# Patient Record
Sex: Male | Born: 1975 | Race: White | Hispanic: No | Marital: Single | Smoking: Current some day smoker
Health system: Southern US, Community
[De-identification: ages and names within clinical notes are randomized; demographics above are authoritative.]

## PROBLEM LIST (undated history)

## (undated) DIAGNOSIS — M542 Cervicalgia: Secondary | ICD-10-CM

## (undated) DIAGNOSIS — R569 Unspecified convulsions: Secondary | ICD-10-CM

## (undated) DIAGNOSIS — M1990 Unspecified osteoarthritis, unspecified site: Secondary | ICD-10-CM

## (undated) DIAGNOSIS — M549 Dorsalgia, unspecified: Secondary | ICD-10-CM

## (undated) DIAGNOSIS — J45909 Unspecified asthma, uncomplicated: Secondary | ICD-10-CM

## (undated) DIAGNOSIS — M25512 Pain in left shoulder: Secondary | ICD-10-CM

## (undated) DIAGNOSIS — F419 Anxiety disorder, unspecified: Secondary | ICD-10-CM

## (undated) DIAGNOSIS — K219 Gastro-esophageal reflux disease without esophagitis: Secondary | ICD-10-CM

## (undated) DIAGNOSIS — F329 Major depressive disorder, single episode, unspecified: Secondary | ICD-10-CM

## (undated) DIAGNOSIS — R202 Paresthesia of skin: Secondary | ICD-10-CM

## (undated) DIAGNOSIS — G8929 Other chronic pain: Secondary | ICD-10-CM

## (undated) DIAGNOSIS — Z8619 Personal history of other infectious and parasitic diseases: Secondary | ICD-10-CM

## (undated) DIAGNOSIS — G43909 Migraine, unspecified, not intractable, without status migrainosus: Secondary | ICD-10-CM

## (undated) DIAGNOSIS — F32A Depression, unspecified: Secondary | ICD-10-CM

## (undated) HISTORY — DX: Unspecified asthma, uncomplicated: J45.909

## (undated) HISTORY — PX: KNEE SURGERY: SHX244

## (undated) HISTORY — DX: Unspecified osteoarthritis, unspecified site: M19.90

## (undated) HISTORY — PX: BACK SURGERY: SHX140

## (undated) HISTORY — DX: Gastro-esophageal reflux disease without esophagitis: K21.9

## (undated) HISTORY — DX: Anxiety disorder, unspecified: F41.9

---
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
## 1998-02-22 ENCOUNTER — Encounter: Admission: RE | Admit: 1998-02-22 | Discharge: 1998-02-22 | Payer: Self-pay | Admitting: Family Medicine

## 1998-03-01 ENCOUNTER — Encounter: Admission: RE | Admit: 1998-03-01 | Discharge: 1998-03-01 | Payer: Self-pay | Admitting: Family Medicine

## 1998-05-04 ENCOUNTER — Emergency Department (HOSPITAL_COMMUNITY): Admission: EM | Admit: 1998-05-04 | Discharge: 1998-05-04 | Payer: Self-pay | Admitting: Emergency Medicine

## 1999-01-20 ENCOUNTER — Encounter: Admission: RE | Admit: 1999-01-20 | Discharge: 1999-01-20 | Payer: Self-pay | Admitting: Family Medicine

## 2000-06-11 ENCOUNTER — Emergency Department (HOSPITAL_COMMUNITY): Admission: EM | Admit: 2000-06-11 | Discharge: 2000-06-11 | Payer: Self-pay | Admitting: Emergency Medicine

## 2000-06-11 ENCOUNTER — Encounter: Admission: RE | Admit: 2000-06-11 | Discharge: 2000-06-11 | Payer: Self-pay | Admitting: Family Medicine

## 2000-12-18 ENCOUNTER — Emergency Department (HOSPITAL_COMMUNITY): Admission: EM | Admit: 2000-12-18 | Discharge: 2000-12-18 | Payer: Self-pay | Admitting: Unknown Physician Specialty

## 2001-02-28 ENCOUNTER — Encounter: Payer: Self-pay | Admitting: Emergency Medicine

## 2001-02-28 ENCOUNTER — Emergency Department (HOSPITAL_COMMUNITY): Admission: EM | Admit: 2001-02-28 | Discharge: 2001-02-28 | Payer: Self-pay | Admitting: Emergency Medicine

## 2001-03-05 ENCOUNTER — Encounter: Admission: RE | Admit: 2001-03-05 | Discharge: 2001-03-05 | Payer: Self-pay | Admitting: Sports Medicine

## 2001-03-20 ENCOUNTER — Encounter: Admission: RE | Admit: 2001-03-20 | Discharge: 2001-03-20 | Payer: Self-pay | Admitting: Family Medicine

## 2001-03-22 ENCOUNTER — Encounter: Admission: RE | Admit: 2001-03-22 | Discharge: 2001-03-22 | Payer: Self-pay | Admitting: *Deleted

## 2001-03-22 ENCOUNTER — Encounter: Admission: RE | Admit: 2001-03-22 | Discharge: 2001-03-22 | Payer: Self-pay | Admitting: Family Medicine

## 2001-08-18 ENCOUNTER — Emergency Department (HOSPITAL_COMMUNITY): Admission: EM | Admit: 2001-08-18 | Discharge: 2001-08-18 | Payer: Self-pay | Admitting: Emergency Medicine

## 2001-09-24 ENCOUNTER — Encounter: Admission: RE | Admit: 2001-09-24 | Discharge: 2001-09-24 | Payer: Self-pay | Admitting: Family Medicine

## 2001-10-11 ENCOUNTER — Encounter: Admission: RE | Admit: 2001-10-11 | Discharge: 2001-10-11 | Payer: Self-pay | Admitting: Family Medicine

## 2002-01-14 ENCOUNTER — Encounter: Admission: RE | Admit: 2002-01-14 | Discharge: 2002-01-14 | Payer: Self-pay | Admitting: Family Medicine

## 2002-06-30 ENCOUNTER — Encounter: Admission: RE | Admit: 2002-06-30 | Discharge: 2002-06-30 | Payer: Self-pay | Admitting: Family Medicine

## 2003-04-21 ENCOUNTER — Emergency Department (HOSPITAL_COMMUNITY): Admission: EM | Admit: 2003-04-21 | Discharge: 2003-04-22 | Payer: Self-pay | Admitting: Emergency Medicine

## 2003-06-17 ENCOUNTER — Emergency Department (HOSPITAL_COMMUNITY): Admission: AD | Admit: 2003-06-17 | Discharge: 2003-06-17 | Payer: Self-pay | Admitting: Family Medicine

## 2003-06-18 ENCOUNTER — Emergency Department (HOSPITAL_COMMUNITY): Admission: AD | Admit: 2003-06-18 | Discharge: 2003-06-18 | Payer: Self-pay | Admitting: Family Medicine

## 2003-06-18 ENCOUNTER — Ambulatory Visit (HOSPITAL_COMMUNITY): Admission: RE | Admit: 2003-06-18 | Discharge: 2003-06-18 | Payer: Self-pay | Admitting: Family Medicine

## 2003-06-18 ENCOUNTER — Encounter: Payer: Self-pay | Admitting: Family Medicine

## 2003-07-13 ENCOUNTER — Encounter: Admission: RE | Admit: 2003-07-13 | Discharge: 2003-07-13 | Payer: Self-pay | Admitting: Sports Medicine

## 2003-07-14 ENCOUNTER — Encounter: Admission: RE | Admit: 2003-07-14 | Discharge: 2003-07-14 | Payer: Self-pay | Admitting: Sports Medicine

## 2004-03-24 ENCOUNTER — Emergency Department (HOSPITAL_COMMUNITY): Admission: EM | Admit: 2004-03-24 | Discharge: 2004-03-24 | Payer: Self-pay | Admitting: *Deleted

## 2004-04-06 ENCOUNTER — Emergency Department (HOSPITAL_COMMUNITY): Admission: EM | Admit: 2004-04-06 | Discharge: 2004-04-06 | Payer: Self-pay | Admitting: Emergency Medicine

## 2004-04-25 ENCOUNTER — Encounter: Admission: RE | Admit: 2004-04-25 | Discharge: 2004-04-25 | Payer: Self-pay | Admitting: Family Medicine

## 2004-08-29 ENCOUNTER — Emergency Department (HOSPITAL_COMMUNITY): Admission: EM | Admit: 2004-08-29 | Discharge: 2004-08-29 | Payer: Self-pay | Admitting: Emergency Medicine

## 2004-08-30 ENCOUNTER — Ambulatory Visit: Payer: Self-pay | Admitting: Family Medicine

## 2004-10-16 ENCOUNTER — Emergency Department (HOSPITAL_COMMUNITY): Admission: EM | Admit: 2004-10-16 | Discharge: 2004-10-16 | Payer: Self-pay | Admitting: Emergency Medicine

## 2005-03-13 ENCOUNTER — Emergency Department: Payer: Self-pay | Admitting: Emergency Medicine

## 2005-04-17 ENCOUNTER — Emergency Department (HOSPITAL_COMMUNITY): Admission: EM | Admit: 2005-04-17 | Discharge: 2005-04-17 | Payer: Self-pay | Admitting: Emergency Medicine

## 2005-05-02 ENCOUNTER — Emergency Department (HOSPITAL_COMMUNITY): Admission: EM | Admit: 2005-05-02 | Discharge: 2005-05-02 | Payer: Self-pay | Admitting: *Deleted

## 2006-01-18 ENCOUNTER — Emergency Department (HOSPITAL_COMMUNITY): Admission: EM | Admit: 2006-01-18 | Discharge: 2006-01-18 | Payer: Self-pay | Admitting: Emergency Medicine

## 2006-01-23 ENCOUNTER — Ambulatory Visit: Payer: Self-pay | Admitting: Sports Medicine

## 2006-02-05 ENCOUNTER — Ambulatory Visit: Payer: Self-pay | Admitting: Family Medicine

## 2006-02-08 ENCOUNTER — Encounter: Admission: RE | Admit: 2006-02-08 | Discharge: 2006-05-09 | Payer: Self-pay | Admitting: Sports Medicine

## 2006-02-23 ENCOUNTER — Ambulatory Visit: Payer: Self-pay | Admitting: Family Medicine

## 2006-03-01 ENCOUNTER — Ambulatory Visit: Payer: Self-pay | Admitting: Family Medicine

## 2006-03-01 ENCOUNTER — Encounter: Admission: RE | Admit: 2006-03-01 | Discharge: 2006-03-01 | Payer: Self-pay | Admitting: Sports Medicine

## 2006-03-19 ENCOUNTER — Ambulatory Visit: Payer: Self-pay | Admitting: Family Medicine

## 2006-03-21 ENCOUNTER — Ambulatory Visit: Payer: Self-pay | Admitting: Sports Medicine

## 2006-03-24 ENCOUNTER — Encounter: Admission: RE | Admit: 2006-03-24 | Discharge: 2006-03-24 | Payer: Self-pay | Admitting: Sports Medicine

## 2006-03-28 ENCOUNTER — Ambulatory Visit: Payer: Self-pay | Admitting: Sports Medicine

## 2006-06-11 ENCOUNTER — Emergency Department (HOSPITAL_COMMUNITY): Admission: EM | Admit: 2006-06-11 | Discharge: 2006-06-11 | Payer: Self-pay | Admitting: Emergency Medicine

## 2006-07-25 ENCOUNTER — Ambulatory Visit (HOSPITAL_COMMUNITY): Admission: RE | Admit: 2006-07-25 | Discharge: 2006-07-26 | Payer: Self-pay | Admitting: Specialist

## 2007-07-04 ENCOUNTER — Telehealth: Payer: Self-pay | Admitting: *Deleted

## 2007-07-04 ENCOUNTER — Ambulatory Visit: Payer: Self-pay | Admitting: Family Medicine

## 2007-07-04 DIAGNOSIS — F4323 Adjustment disorder with mixed anxiety and depressed mood: Secondary | ICD-10-CM

## 2007-07-05 ENCOUNTER — Telehealth (INDEPENDENT_AMBULATORY_CARE_PROVIDER_SITE_OTHER): Payer: Self-pay | Admitting: *Deleted

## 2007-07-23 ENCOUNTER — Telehealth (INDEPENDENT_AMBULATORY_CARE_PROVIDER_SITE_OTHER): Payer: Self-pay | Admitting: *Deleted

## 2007-07-26 ENCOUNTER — Ambulatory Visit: Payer: Self-pay | Admitting: Family Medicine

## 2007-07-26 ENCOUNTER — Encounter (INDEPENDENT_AMBULATORY_CARE_PROVIDER_SITE_OTHER): Payer: Self-pay | Admitting: Family Medicine

## 2007-08-26 ENCOUNTER — Emergency Department (HOSPITAL_COMMUNITY): Admission: EM | Admit: 2007-08-26 | Discharge: 2007-08-26 | Payer: Self-pay | Admitting: Emergency Medicine

## 2007-09-19 ENCOUNTER — Ambulatory Visit: Payer: Self-pay | Admitting: Family Medicine

## 2007-09-20 ENCOUNTER — Telehealth: Payer: Self-pay | Admitting: Family Medicine

## 2007-10-15 ENCOUNTER — Emergency Department (HOSPITAL_COMMUNITY): Admission: EM | Admit: 2007-10-15 | Discharge: 2007-10-15 | Payer: Self-pay | Admitting: Emergency Medicine

## 2007-10-25 ENCOUNTER — Emergency Department (HOSPITAL_COMMUNITY): Admission: EM | Admit: 2007-10-25 | Discharge: 2007-10-26 | Payer: Self-pay | Admitting: Emergency Medicine

## 2007-10-28 ENCOUNTER — Encounter: Payer: Self-pay | Admitting: *Deleted

## 2007-10-28 ENCOUNTER — Ambulatory Visit: Payer: Self-pay | Admitting: Sports Medicine

## 2007-10-28 ENCOUNTER — Telehealth: Payer: Self-pay | Admitting: *Deleted

## 2007-10-31 ENCOUNTER — Telehealth: Payer: Self-pay | Admitting: *Deleted

## 2007-10-31 ENCOUNTER — Emergency Department (HOSPITAL_COMMUNITY): Admission: EM | Admit: 2007-10-31 | Discharge: 2007-10-31 | Payer: Self-pay | Admitting: Family Medicine

## 2008-01-21 ENCOUNTER — Emergency Department (HOSPITAL_COMMUNITY): Admission: EM | Admit: 2008-01-21 | Discharge: 2008-01-21 | Payer: Self-pay | Admitting: Emergency Medicine

## 2008-04-07 ENCOUNTER — Ambulatory Visit: Payer: Self-pay | Admitting: Family Medicine

## 2008-04-07 ENCOUNTER — Encounter: Payer: Self-pay | Admitting: *Deleted

## 2008-06-10 ENCOUNTER — Ambulatory Visit: Payer: Self-pay | Admitting: Family Medicine

## 2008-06-10 ENCOUNTER — Telehealth: Payer: Self-pay | Admitting: *Deleted

## 2008-07-08 ENCOUNTER — Emergency Department (HOSPITAL_COMMUNITY): Admission: EM | Admit: 2008-07-08 | Discharge: 2008-07-08 | Payer: Self-pay | Admitting: Emergency Medicine

## 2008-07-10 ENCOUNTER — Encounter: Payer: Self-pay | Admitting: Family Medicine

## 2008-07-10 ENCOUNTER — Ambulatory Visit: Payer: Self-pay | Admitting: Family Medicine

## 2008-07-10 ENCOUNTER — Telehealth: Payer: Self-pay | Admitting: *Deleted

## 2008-07-10 DIAGNOSIS — M545 Low back pain: Secondary | ICD-10-CM

## 2008-07-15 ENCOUNTER — Ambulatory Visit: Payer: Self-pay | Admitting: Family Medicine

## 2008-07-15 ENCOUNTER — Telehealth: Payer: Self-pay | Admitting: *Deleted

## 2008-07-23 ENCOUNTER — Telehealth: Payer: Self-pay | Admitting: *Deleted

## 2008-07-24 ENCOUNTER — Ambulatory Visit: Payer: Self-pay | Admitting: Family Medicine

## 2008-07-27 ENCOUNTER — Ambulatory Visit: Payer: Self-pay | Admitting: Family Medicine

## 2008-08-05 ENCOUNTER — Emergency Department (HOSPITAL_COMMUNITY): Admission: EM | Admit: 2008-08-05 | Discharge: 2008-08-06 | Payer: Self-pay | Admitting: Emergency Medicine

## 2008-08-05 ENCOUNTER — Telehealth: Payer: Self-pay | Admitting: Family Medicine

## 2008-08-06 ENCOUNTER — Ambulatory Visit: Payer: Self-pay | Admitting: Family Medicine

## 2008-08-06 ENCOUNTER — Ambulatory Visit (HOSPITAL_COMMUNITY): Admission: RE | Admit: 2008-08-06 | Discharge: 2008-08-06 | Payer: Self-pay | Admitting: Family Medicine

## 2008-08-10 ENCOUNTER — Ambulatory Visit: Payer: Self-pay | Admitting: Family Medicine

## 2009-01-27 ENCOUNTER — Encounter: Payer: Self-pay | Admitting: Family Medicine

## 2009-02-15 ENCOUNTER — Ambulatory Visit: Payer: Self-pay | Admitting: Family Medicine

## 2009-02-15 DIAGNOSIS — G43909 Migraine, unspecified, not intractable, without status migrainosus: Secondary | ICD-10-CM

## 2009-02-25 ENCOUNTER — Emergency Department (HOSPITAL_COMMUNITY): Admission: EM | Admit: 2009-02-25 | Discharge: 2009-02-25 | Payer: Self-pay | Admitting: Family Medicine

## 2009-02-26 ENCOUNTER — Emergency Department (HOSPITAL_COMMUNITY): Admission: EM | Admit: 2009-02-26 | Discharge: 2009-02-26 | Payer: Self-pay | Admitting: Emergency Medicine

## 2009-03-10 ENCOUNTER — Ambulatory Visit: Payer: Self-pay | Admitting: Family Medicine

## 2009-03-10 ENCOUNTER — Telehealth: Payer: Self-pay | Admitting: Family Medicine

## 2009-05-10 ENCOUNTER — Encounter (INDEPENDENT_AMBULATORY_CARE_PROVIDER_SITE_OTHER): Payer: Self-pay | Admitting: *Deleted

## 2009-05-10 DIAGNOSIS — F17200 Nicotine dependence, unspecified, uncomplicated: Secondary | ICD-10-CM | POA: Insufficient documentation

## 2009-05-10 HISTORY — DX: Nicotine dependence, unspecified, uncomplicated: F17.200

## 2009-08-22 ENCOUNTER — Emergency Department (HOSPITAL_COMMUNITY): Admission: EM | Admit: 2009-08-22 | Discharge: 2009-08-22 | Payer: Self-pay | Admitting: Family Medicine

## 2010-03-13 ENCOUNTER — Emergency Department (HOSPITAL_COMMUNITY): Admission: EM | Admit: 2010-03-13 | Discharge: 2010-03-13 | Payer: Self-pay | Admitting: Emergency Medicine

## 2010-03-14 ENCOUNTER — Encounter: Payer: Self-pay | Admitting: Family Medicine

## 2010-03-14 ENCOUNTER — Ambulatory Visit: Payer: Self-pay | Admitting: Family Medicine

## 2010-03-18 ENCOUNTER — Telehealth: Payer: Self-pay | Admitting: *Deleted

## 2010-03-18 ENCOUNTER — Ambulatory Visit: Payer: Self-pay | Admitting: Family Medicine

## 2010-03-22 ENCOUNTER — Encounter: Payer: Self-pay | Admitting: *Deleted

## 2010-04-29 ENCOUNTER — Emergency Department (HOSPITAL_COMMUNITY): Admission: EM | Admit: 2010-04-29 | Discharge: 2010-04-29 | Payer: Self-pay | Admitting: Emergency Medicine

## 2010-07-11 ENCOUNTER — Emergency Department (HOSPITAL_COMMUNITY): Admission: EM | Admit: 2010-07-11 | Discharge: 2010-07-11 | Payer: Self-pay | Admitting: Emergency Medicine

## 2010-10-04 NOTE — Progress Notes (Signed)
Summary: Triage  Phone Note Call from Patient Call back at Home Phone [phone]   Reason for Call: Talk to Nurse Summary of Call: pt put on neurotin, last night foot starting swelling  & pt  read that was a serious side effect Initial call taken by: Knox Royalty,  March 18, 2010 2:44 PM  Follow-up for Phone Call        he read that this is a serious side effect. to come in now to see a doctor. started the med 03/14/10 Follow-up by: Golden Circle RN,  March 18, 2010 2:48 PM

## 2010-10-04 NOTE — Miscellaneous (Signed)
Summary: ROI  ROI   Imported By: Knox Royalty 03/25/2010 12:10:41  _____________________________________________________________________  External Attachment:    Type:   Image     Comment:   External Document

## 2010-10-04 NOTE — Assessment & Plan Note (Signed)
Summary: f/u back pain,df   Vital Signs:  Patient profile:   35 year old male Weight:      224.5 pounds Temp:     98.1 degrees F oral Pulse rate:   69 / minute Pulse rhythm:   regular BP sitting:   138 / 80  (left arm) Cuff size:   large  Vitals Entered By: Loralee Pacas CMA (March 14, 2010 9:56 AM) CC: back pain Pain Assessment Patient in pain? yes     Location: lower back Intensity: 9   Primary Care Provider:  . [facility]  CC:  back pain.  History of Present Illness: 35 yo here for ER follow-up  Hx of chronic back pain with history of L5 "disk shaving" per patient.  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  Was prescribed vicodin.  Described as low back pain, radiating down left leg down to heel.  No weakness, numbness, tingling, urinarty or bowel retention or incontinence.  Patient says his goals are increasing function and pain management until he gets insurance back at which time he plans on continuing care with Dr. Shelle Iron at [facility] and getting lumbar fusion.  He declines physical therapy stating he has already done so and got no benefit.  Says narcotics help, declines NSAID use as he has a relative with GI upset and would like to avoid this side effect.  He states he has not been here since Dr. Dayton Martes but he has a twin brother who also comes here.  He has several recent visits here in the last year with Dr. Karie Schwalbe and Dr. Burnadette Pop.    Habits & Providers  Alcohol-Tobacco-Diet     Tobacco Status: current  Current Medications (verified): 1)  Imitrex 25 Mg Tabs (Sumatriptan Succinate) .... One Tablet By Mouth At The Start of The Migraine and Then Another Tablet 2 Hours Later If The Ha Continues 2)  Flexeril 10 Mg Tabs (Cyclobenzaprine Hcl) .... One Tab By Mouth Three Times A Day 3)  Gabapentin 300 Mg Caps (Gabapentin) .... Take One Tablet Three Times A Day  Allergies: No Known Drug  Allergies PMH-FH-SH reviewed-no changes except otherwise noted  Review of Systems      See HPI General:  Denies fever and weight loss. MS:  Complains of low back pain. Neuro:  Denies numbness, tingling, and weakness.  Physical Exam  General:  A&O, here with son.  NAD. Msk:  pain in lower lumbar area with contralateral straight leg raise with positive radiation on straight leg testing.  No pain on twisting motion.  Pain on palpation of left lower lumbar area.  NO pain over spinous processes. Neurologic:  strength and sesation normal in LE.   Impression & Recommendations:  Problem # 1:  LOW BACK PAIN (ICD-724.2)  Chronic pain.  Discussed his history and ultimate plan for surgery once he gets insurance.  We discussed overall approach being physical therapy, improving function while reducing but not eliminating pain.  I told him I would not prescribe narcotics for him and decided to try neurontin for chronic pain/ sciatica pain.  Offered tylenol + NSAID but he declined.  Will follow-up in 3-4 weeks to increase gabapentin if needed/ evaluate pain management.   Will request records from [facility] today.  His updated medication list for this problem includes:    Flexeril 10 Mg Tabs (Cyclobenzaprine hcl) ..... One tab by mouth three times  a day  Orders: [facility]- Est Level  3 (62130)  Problem # 2:  DEPRESSIVE DISORDER (ICD-311) briefly discussed with patient link between chronic pain and depression.  He states he has been feeling well until several days ago increased back pain has "gotten him down"  He is reluctant to discuss his history and is not open to treatment at this time as he does not feel it is an issue.  Complete Medication List: 1)  Imitrex 25 Mg Tabs (Sumatriptan succinate) .... One tablet by mouth at the start of the migraine and then another tablet 2 hours later if the ha continues 2)  Flexeril 10 Mg Tabs (Cyclobenzaprine hcl) .... One tab by mouth three times a day 3)   Gabapentin 300 Mg Caps (Gabapentin) .... Take one tablet three times a day  Patient Instructions: 1)  Nice to meet you 2)  Make follow-up appt in 3-4 weeks  3)  Given handout on low back pain exercises Prescriptions: GABAPENTIN 300 MG CAPS (GABAPENTIN) take one tablet three times a day  #90 x 1   Entered and Authorized by:   Delbert Harness MD   Signed by:   Delbert Harness MD on 03/14/2010   Method used:   Electronically to        [facility]* (retail)       [address] [place], [place]  [address]       Ph: [phone] or [phone]       Fax: [phone]   RxID:   [id number]

## 2010-10-04 NOTE — Assessment & Plan Note (Signed)
Summary: foot is swollen/[place]/[facility]   Vital Signs:  Patient profile:   35 year old male Weight:      230.5 pounds Temp:     98. degrees F oral Pulse rate:   111 / minute Pulse rhythm:   regular BP sitting:   121 / 73  (left arm) Cuff size:   large  Vitals Entered By: Loralee Pacas CMA (March 18, 2010 3:57 PM) CC: right leg swollen, back pain Is Patient Diabetic? No Comments right leg swollen x 1 day pt states that he has started having panic attacks   Primary Care Provider:  . [facility]  CC:  right leg swollen and back pain.  History of Present Illness: New leg swelling Rt leg > Left.  Did not happen until started gabapentin.  No trauma or other DVT risk.  Reviewed gabapentin information: peripheral edema occurs  ~8% of patients.  Also list angioedema as possibility - he has had fixed swelling of the legs.  As I was finishing the history portion of the visit.  He asked "What are you going to do now about my pain?"  I informed him that my plan was to start another neuropathic pain med.  He volunteered that he had found that if he took 2 vicodin, his pain was relieved and thus wanted a prescription.  I informed him that was not my plan.    Patient then became upset and said that he had to come here because he lost his insurance, that we did not practice good medicine because we did not treat his pain and that he would go elsewhere if he had options because he had no confidence in [place].  He also said he would ask to speak to the "head guy" to voice these complaints.  I told him two things: First, I am the head guy. Second, that we did not want to care for someone who thinks we are bad docs and only came to [place] because he had no other options. This responce made him more angry and he left before an exam was done.  He was visibly upset and angry and proceeded to voice that displeasure at the nursing station saying, "That doctor is an asshole."  Habits &  Providers  Alcohol-Tobacco-Diet     Tobacco Status: current  Allergies: No Known Drug Allergies  Physical Exam  General:  Well-developed,well-nourished,in no acute distress; alert.  Short term wt gain since last visit noted. Extremities:  Both legs visably swollen Rt>Lt.  Patient left before further exam could be done.   Impression & Recommendations:  Problem # 1:  LOW BACK PAIN (ICD-724.2)  Chronic.  While I had intended to begin nortryptaline, no Rx was written His updated medication list for this problem includes:    Flexeril 10 Mg Tabs (Cyclobenzaprine hcl) ..... One tab by mouth three times a day  Orders: [facility]- Est Level  3 (60454)  Problem # 2:  ADVERSE DRUG REACTION (ICD-995.20)  gabapentin likely cause of swelling.  Advised to stop gabapentin.  Orders: [facility]- Est Level  3 (09811)  Complete Medication List: 1)  Imitrex 25 Mg Tabs (Sumatriptan succinate) .... One tablet by mouth at the start of the migraine and then another tablet 2 hours later if the ha continues 2)  Flexeril 10 Mg Tabs (Cyclobenzaprine hcl) .... One tab by mouth three times a day

## 2010-10-04 NOTE — Assessment & Plan Note (Signed)
 Summary: back is locked up /[place]   Vital Signs:  Patient profile:   35 year old male Height:      71 inches Weight:      224 pounds BMI:     31.35 Temp:     98.4 degrees F Pulse rate:   85 / minute BP sitting:   136 / 94  (right arm)  Vitals Entered By: Greig Lunger RN (March 10, 2009 1:41 PM) CC: back pain Pain Assessment Patient in pain? yes     Location: back Intensity: 9 Type: sharp   Primary Care Provider:  Alda Carpen  MD  CC:  back pain.  History of Present Illness: 63M with Hx chronic LBP comes in with c/o having had something pop.  Was bending over and heard a pop, followed by pain.  States that it had been getting better actually, but the quality was similar to his pain at previous visits.  No numbness/tingling down legs.  No bowel/bladder dysfunction or saddle anesthesia.  Is able to walk, pain is a 5/10.  He became very angry when I told him that I would not be prescribing narcotics.  He also refused trigger point injections.  He had been using flexeril  but ran out.  Says this helps him a lot.  Has also been using ibuprofen .  No PT yet as he does not have enough money.  States that he does not trust XR and MRI because they didnt show any problems, then he went to the orthopod who took him to the OR and shaved off bone spurs that were pinching a nerve.  Habits & Providers  Alcohol-Tobacco-Diet     Tobacco Status: current     Cigarette Packs/Day: 0.25  Allergies: No Known Drug Allergies  Past History:  Past Medical History: Last updated: 08/06/2008 hx of 5 concussions back injury 2007  Social History: Packs/Day:  0.25  Review of Systems       See HPI  Physical Exam  General:  Well-developed,well-nourished,in no acute distress; alert,appropriate and cooperative throughout examination Lungs:  Normal respiratory effort, chest expands symmetrically. Lungs are clear to auscultation, no crackles or wheezes. Heart:  Normal rate and regular rhythm. S1  and S2 normal without gallop, murmur, click, rub or other extra sounds. Msk:  No overt swelling or erythema visible to me,  however he does have some left sided muscle spasm approx at the intercristal line.  He refused to have me inject the trigger point. Extremities:  No clubbing, cyanosis, edema, or deformity noted with normal full range of motion of all joints.   Neurologic:  Plantar reflexes are down-going bilaterally. DTRs are symmetrical throughout. Sensory, motor and coordinative functions appear intact.    Impression & Recommendations:  Problem # 1:  LOW BACK PAIN (ICD-724.2) Assessment Deteriorated Chronic LBP, likely similar as before.  No acute trauma.  Recommended continuing Flexeril  as this helped in the past, Also heating pads, stretching, massage, and PT as soon as he can afford it.  He is in the process of getting insurance through his work but says this could take a  year.  As he does have a trigger point I think he could benefit (at least in the short term) from injection of the area if it is still present at subsequent visits.  No narcotics as they have no long term benefit with this condition.  No red flags or signs of cauda equina syndrome or cord compression.  His updated medication list for this  problem includes:    Flexeril  10 Mg Tabs (Cyclobenzaprine  hcl) ..... One tab by mouth three times a day  Orders: [facility]- Est Level  3 (00786)  Complete Medication List: 1)  Imitrex  25 Mg Tabs (Sumatriptan  succinate) .... One tablet by mouth at the start of the migraine and then another tablet 2 hours later if the ha continues 2)  Flexeril  10 Mg Tabs (Cyclobenzaprine  hcl) .... One tab by mouth three times a day  Patient Instructions: 1)  Good to meet you today, 2)  I want you to use heating pads, stretch, stay active, and use your flexeril  and Ibuprofen  as prescribed.  Also try to have someone massage the spasming muscle in your lower back.  Do not take more than 4000mg  of  ibuprofen  in one day. 3)  I have refilled your flexeril . 4)  Come back to see Dr. Alla at your next regularly scheduled appt. 5)  -Dr. ONEIDA. Prescriptions: FLEXERIL  10 MG TABS (CYCLOBENZAPRINE  HCL) One tab by mouth three times a day  #90 x 1   Entered and Authorized by:   Debby Petties MD   Signed by:   Debby Petties MD on 03/10/2009   Method used:   Electronically to        [facility]* (retail)       [address] [place], [place]  [address]       Ph: [phone] or [phone]       Fax: [phone]   RxID:   [id number]

## 2010-10-04 NOTE — Letter (Signed)
Summary: Termination of Care Letter  [facility]  [address]   [place], [place] [address]   Phone: [phone]  Fax: [phone]    03/22/2010 MRN: [id number]  Dear Micheal Roth,  Based on the behavior you displayed at your 03/18/10 office visit with Dr. Leveda Anna, please be advised that the [facility] will no longer be your healthcare provider.  Accordingly, all further requests for appointments and office visits will be declined.  As such, you are not allowed on the premises of the [facility].  Should you have another physician who will take over your care, please advise [place] and we will forward a copy of your medical records.  If you do not know of another physician, we will assist you with a referral.  The termination of your relationship with this office is effective immediately.  In the next 30 days if you find that you have a need for medical care we will refer you to another provider for that care.   Sincerely,  [facility]

## 2010-10-21 ENCOUNTER — Encounter: Payer: Self-pay | Admitting: *Deleted

## 2010-12-05 LAB — DIFFERENTIAL
Basophils Relative: 0 % (ref 0–1)
Eosinophils Absolute: 0 10*3/uL (ref 0.0–0.7)
Eosinophils Relative: 0 % (ref 0–5)
Lymphocytes Relative: 3 % — ABNORMAL LOW (ref 12–46)
Monocytes Absolute: 0.6 10*3/uL (ref 0.1–1.0)
Monocytes Relative: 5 % (ref 3–12)
Neutro Abs: 10.2 10*3/uL — ABNORMAL HIGH (ref 1.7–7.7)

## 2010-12-05 LAB — POCT I-STAT, CHEM 8
Creatinine, Ser: 1.1 mg/dL (ref 0.4–1.5)
Glucose, Bld: 106 mg/dL — ABNORMAL HIGH (ref 70–99)
HCT: 50 % (ref 39.0–52.0)
Hemoglobin: 17 g/dL (ref 13.0–17.0)
Potassium: 3.8 mEq/L (ref 3.5–5.1)
Sodium: 137 mEq/L (ref 135–145)
TCO2: 25 mmol/L (ref 0–100)

## 2010-12-05 LAB — CBC
HCT: 49.2 % (ref 39.0–52.0)
Hemoglobin: 17.1 g/dL — ABNORMAL HIGH (ref 13.0–17.0)
MCHC: 34.7 g/dL (ref 30.0–36.0)
MCV: 91.3 fL (ref 78.0–100.0)
RBC: 5.39 MIL/uL (ref 4.22–5.81)
WBC: 11.1 10*3/uL — ABNORMAL HIGH (ref 4.0–10.5)

## 2010-12-05 LAB — POCT URINALYSIS DIP (DEVICE): pH: 6 (ref 5.0–8.0)

## 2010-12-05 LAB — POCT RAPID STREP A (OFFICE): Streptococcus, Group A Screen (Direct): NEGATIVE

## 2010-12-18 ENCOUNTER — Emergency Department (HOSPITAL_COMMUNITY)
Admission: EM | Admit: 2010-12-18 | Discharge: 2010-12-18 | Disposition: A | Discharge status: Home / Self Care | Payer: Self-pay | Attending: Emergency Medicine | Admitting: Emergency Medicine

## 2010-12-18 ENCOUNTER — Emergency Department (HOSPITAL_COMMUNITY): Payer: Self-pay

## 2010-12-18 DIAGNOSIS — Y92009 Unspecified place in unspecified non-institutional (private) residence as the place of occurrence of the external cause: Secondary | ICD-10-CM | POA: Insufficient documentation

## 2010-12-18 DIAGNOSIS — F3289 Other specified depressive episodes: Secondary | ICD-10-CM | POA: Insufficient documentation

## 2010-12-18 DIAGNOSIS — M545 Low back pain, unspecified: Secondary | ICD-10-CM | POA: Insufficient documentation

## 2010-12-18 DIAGNOSIS — G8929 Other chronic pain: Secondary | ICD-10-CM | POA: Insufficient documentation

## 2010-12-18 DIAGNOSIS — Z9889 Other specified postprocedural states: Secondary | ICD-10-CM | POA: Insufficient documentation

## 2010-12-18 DIAGNOSIS — IMO0002 Reserved for concepts with insufficient information to code with codable children: Secondary | ICD-10-CM | POA: Insufficient documentation

## 2010-12-18 DIAGNOSIS — F17200 Nicotine dependence, unspecified, uncomplicated: Secondary | ICD-10-CM | POA: Insufficient documentation

## 2010-12-18 DIAGNOSIS — M25519 Pain in unspecified shoulder: Secondary | ICD-10-CM | POA: Insufficient documentation

## 2010-12-18 DIAGNOSIS — F329 Major depressive disorder, single episode, unspecified: Secondary | ICD-10-CM | POA: Insufficient documentation

## 2011-01-12 ENCOUNTER — Telehealth: Payer: Self-pay | Admitting: Family Medicine

## 2011-01-12 ENCOUNTER — Emergency Department (HOSPITAL_COMMUNITY)
Admission: EM | Admit: 2011-01-12 | Discharge: 2011-01-12 | Disposition: A | Discharge status: Home / Self Care | Payer: Self-pay | Attending: Emergency Medicine | Admitting: Emergency Medicine

## 2011-01-12 ENCOUNTER — Other Ambulatory Visit (HOSPITAL_COMMUNITY): Payer: Self-pay

## 2011-01-12 ENCOUNTER — Emergency Department (HOSPITAL_COMMUNITY): Payer: Self-pay

## 2011-01-12 DIAGNOSIS — H9319 Tinnitus, unspecified ear: Secondary | ICD-10-CM | POA: Insufficient documentation

## 2011-01-12 DIAGNOSIS — R42 Dizziness and giddiness: Secondary | ICD-10-CM | POA: Insufficient documentation

## 2011-01-12 DIAGNOSIS — Z9889 Other specified postprocedural states: Secondary | ICD-10-CM | POA: Insufficient documentation

## 2011-01-12 DIAGNOSIS — R4789 Other speech disturbances: Secondary | ICD-10-CM | POA: Insufficient documentation

## 2011-01-12 DIAGNOSIS — R471 Dysarthria and anarthria: Secondary | ICD-10-CM | POA: Insufficient documentation

## 2011-01-12 DIAGNOSIS — F17200 Nicotine dependence, unspecified, uncomplicated: Secondary | ICD-10-CM | POA: Insufficient documentation

## 2011-01-12 NOTE — Telephone Encounter (Signed)
Patient was dismissed due to disrespectful behavior displayed to Dr. Leveda Anna.  Patient was upset that Dr. Leveda Anna would not prescribe pain medication.  Began yelling, using foul language and walked out of the clinic.  Patient has a h/o of this type of behavior so based on this he will not be allowed back in the practice.

## 2011-01-12 NOTE — Telephone Encounter (Signed)
Pt was dismissed July 2011 & is asking to come back, said he was told to call & speak with office manager.

## 2011-01-12 NOTE — Telephone Encounter (Signed)
Noted and agree. 

## 2011-01-20 NOTE — Op Note (Signed)
Roth, Micheal           ACCOUNT NO.:  [id number]   MEDICAL RECORD NO.:  [id number]          PATIENT TYPE:  OIB   LOCATION:  0098                         FACILITY:  [facility]   PHYSICIAN:  Jene Every, M.D.    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   POSTOPERATIVE DIAGNOSES:  Spinal stenosis with herniated nucleus  pulposus at L5-S1, left.   PROCEDURE PERFORMED:  Lateral recess decompression at L5-S1,  microdiskectomy L5-S1, foraminotomy of S1.   ANESTHESIA:  General.   SURGEON:  Jene Every, M.D.   ASSISTANT:  Roma Schanz, P.A.   INDICATIONS FOR PROCEDURE:  This is a 35 year old with S1 radiculopathy  and refractory to conservative treatment.  Operative intervention is  indicated for decompression of the S1 nerve root and a decompression of  the lateral recess by a disk herniation and facet hypertrophy and  ligament of flavum hypertrophy.  The risks and benefits were discussed  including bleeding, infection, damage to neurovascular structures, CSF  leakage, epidural fibrosis, adjacent segment disease, need for fusion in  the future and anesthetic complications, etc.   DESCRIPTION OF PROCEDURE:  The patient was placed in the supine  position.  After the induction of adequate general anesthesia and 1 gram  of Kefzol, the patient was placed prone on the [place] frame.  All bony  prominences were well-padded.  The lumbar region was prepped and draped  in the usual sterile fashion.  Two #18 gauge spinal needles were  utilized to localize the interspace at 5-1.  An incision was made from  the spinous process of 5-1.  The subcutaneous tissues were dissected.  Electrocautery was utilized to obtain hemostasis.  The dorsal lumbar  fascia identified and divided in line with the skin  incision.  The  paraspinous muscle elevated from the lamina of L5 and S1.  Two Penfield-  4's were placed in the inner-lamina spaces identifying 4-5 and then 5-1.  We took the most caudad space and palpated the sacrum, confirming the L5-  S1 interspace.  This was bi-axillary.  We then placed a McCullough  retractor.  The operating microscope was draped and brought into the  surgical field.  Next, the ligament of flavum detached from the cephalad  edge of S1 utilizing a straight curet.  A foraminotomy of S1 was  performed.  A hemi-laminotomy of the caudad edge of L5 was performed  with a 2 mm Kerrison.  Noted were remnants of a cortisone injection.  Also significant lateral recess stenosis, secondary to facet  hypertrophy, ligament of flavum hypertrophy, a focal disk herniation and  epidural venous plexus.  We decompressed the lateral recess.  We gently  mobilized the S1 nerve root medially.  Identified a focal herniated  nucleus pulposus.  An annulotomy was performed.  Copious portion of disk  material was removed from the disk space with straight and up-biting  pituitary.  Multiple passes.  A full diskectomy of herniated material  was then performed.  Bipolar electrocautery was utilized to achieve  hemostasis.  We had at least 1 cm of excursion of the S1 nerve root  medial to the pedicle without difficulty.  The disk space was copiously  irrigated with antibiotic irrigation.  Inspection revealed no CSF  leakage or active bleeding.  We checked in the axilla of the nerve root,  the shoulder, beneath the thecal sac and the foramen of L5 and S1.  No  residual disk herniation.  We then placed 1 mL of fentanyl in the  epidural space and removed the McCullough retractor and inspection of  the paraspinous muscles and had no evidence of active bleeding.  We  irrigated then and closed the fascia with #1 Vicryl interrupted figure-  of-eight sutures.  The subcutaneous tissues were reapproximated with  #2-  0 Vicryl sutures and the skin was reapproximated with staples.  The  wound was dressed sterilely.  The patient was placed supine on the  hospital bed and extubated without difficulty.   The patient was transported to the recovery room in satisfactory  condition.   COMPLICATIONS:  None.      Jene Every, M.D.  Electronically Signed     JB/MEDQ  D:  07/25/2006  T:  07/25/2006  Job:  [id number]

## 2011-01-30 ENCOUNTER — Emergency Department (HOSPITAL_COMMUNITY)
Admission: EM | Admit: 2011-01-30 | Discharge: 2011-01-30 | Disposition: A | Discharge status: Home / Self Care | Payer: Self-pay | Attending: Emergency Medicine | Admitting: Emergency Medicine

## 2011-01-30 DIAGNOSIS — M545 Low back pain, unspecified: Secondary | ICD-10-CM | POA: Insufficient documentation

## 2011-01-30 DIAGNOSIS — R209 Unspecified disturbances of skin sensation: Secondary | ICD-10-CM | POA: Insufficient documentation

## 2011-01-30 DIAGNOSIS — F329 Major depressive disorder, single episode, unspecified: Secondary | ICD-10-CM | POA: Insufficient documentation

## 2011-01-30 DIAGNOSIS — G8929 Other chronic pain: Secondary | ICD-10-CM | POA: Insufficient documentation

## 2011-01-30 DIAGNOSIS — F3289 Other specified depressive episodes: Secondary | ICD-10-CM | POA: Insufficient documentation

## 2011-01-30 DIAGNOSIS — F17200 Nicotine dependence, unspecified, uncomplicated: Secondary | ICD-10-CM | POA: Insufficient documentation

## 2011-01-30 LAB — URINALYSIS, ROUTINE W REFLEX MICROSCOPIC
Bilirubin Urine: NEGATIVE
Protein, ur: NEGATIVE mg/dL
Specific Gravity, Urine: 1.013 (ref 1.005–1.030)

## 2011-04-27 ENCOUNTER — Emergency Department (HOSPITAL_COMMUNITY)
Admission: EM | Admit: 2011-04-27 | Discharge: 2011-04-27 | Disposition: A | Discharge status: Home / Self Care | Payer: Self-pay | Attending: Emergency Medicine | Admitting: Emergency Medicine

## 2011-04-27 ENCOUNTER — Emergency Department (HOSPITAL_COMMUNITY): Payer: Self-pay

## 2011-04-27 DIAGNOSIS — M25569 Pain in unspecified knee: Secondary | ICD-10-CM | POA: Insufficient documentation

## 2011-04-27 DIAGNOSIS — F329 Major depressive disorder, single episode, unspecified: Secondary | ICD-10-CM | POA: Insufficient documentation

## 2011-04-27 DIAGNOSIS — F3289 Other specified depressive episodes: Secondary | ICD-10-CM | POA: Insufficient documentation

## 2011-04-27 DIAGNOSIS — F411 Generalized anxiety disorder: Secondary | ICD-10-CM | POA: Insufficient documentation

## 2011-06-06 LAB — CULTURE, ROUTINE-ABSCESS

## 2011-09-20 ENCOUNTER — Emergency Department (HOSPITAL_COMMUNITY)
Admission: EM | Admit: 2011-09-20 | Discharge: 2011-09-20 | Disposition: A | Discharge status: Home / Self Care | Payer: Self-pay | Attending: Emergency Medicine | Admitting: Emergency Medicine

## 2011-09-20 ENCOUNTER — Encounter (HOSPITAL_COMMUNITY): Payer: Self-pay

## 2011-09-20 DIAGNOSIS — H538 Other visual disturbances: Secondary | ICD-10-CM | POA: Insufficient documentation

## 2011-09-20 DIAGNOSIS — H53149 Visual discomfort, unspecified: Secondary | ICD-10-CM | POA: Insufficient documentation

## 2011-09-20 DIAGNOSIS — G43909 Migraine, unspecified, not intractable, without status migrainosus: Secondary | ICD-10-CM | POA: Insufficient documentation

## 2011-09-20 DIAGNOSIS — R110 Nausea: Secondary | ICD-10-CM | POA: Insufficient documentation

## 2011-09-20 HISTORY — DX: Migraine, unspecified, not intractable, without status migrainosus: G43.909

## 2011-09-20 MED ORDER — DEXAMETHASONE SODIUM PHOSPHATE 10 MG/ML IJ SOLN
10.0000 mg | Freq: Once | INTRAMUSCULAR | Status: AC
  Administered 2011-09-20: 10 mg via INTRAVENOUS
  Filled 2011-09-20 (×2): qty 1

## 2011-09-20 MED ORDER — KETOROLAC TROMETHAMINE 30 MG/ML IJ SOLN
30.0000 mg | Freq: Once | INTRAMUSCULAR | Status: AC
  Administered 2011-09-20: 30 mg via INTRAVENOUS
  Filled 2011-09-20 (×2): qty 1

## 2011-09-20 MED ORDER — SODIUM CHLORIDE 0.9 % IV BOLUS (SEPSIS)
1000.0000 mL | Freq: Once | INTRAVENOUS | Status: AC
  Administered 2011-09-20: 1000 mL via INTRAVENOUS

## 2011-09-20 MED ORDER — METOCLOPRAMIDE HCL 5 MG/ML IJ SOLN
10.0000 mg | Freq: Once | INTRAMUSCULAR | Status: AC
  Administered 2011-09-20: 10 mg via INTRAVENOUS
  Filled 2011-09-20: qty 2

## 2011-09-20 MED ORDER — DIPHENHYDRAMINE HCL 50 MG/ML IJ SOLN
25.0000 mg | Freq: Once | INTRAMUSCULAR | Status: AC
  Administered 2011-09-20: 25 mg via INTRAVENOUS
  Filled 2011-09-20 (×2): qty 1

## 2011-09-20 MED ORDER — HYDROMORPHONE HCL PF 1 MG/ML IJ SOLN
1.0000 mg | Freq: Once | INTRAMUSCULAR | Status: AC
Start: 2011-09-20 — End: 2011-09-20
  Administered 2011-09-20: 1 mg via INTRAVENOUS
  Filled 2011-09-20: qty 1

## 2011-09-20 MED ORDER — HYDROMORPHONE HCL PF 1 MG/ML IJ SOLN
1.0000 mg | Freq: Once | INTRAMUSCULAR | Status: AC
  Administered 2011-09-20: 1 mg via INTRAVENOUS
  Filled 2011-09-20: qty 1

## 2011-09-20 MED ORDER — PROMETHAZINE HCL 25 MG/ML IJ SOLN
25.0000 mg | INTRAMUSCULAR | Status: AC
  Administered 2011-09-20: 25 mg via INTRAVENOUS
  Filled 2011-09-20: qty 1

## 2011-09-20 NOTE — ED Notes (Signed)
Pt sleeping at present, lights are dimmed in room for pt comfort.

## 2011-09-20 NOTE — ED Notes (Signed)
Migraine began on Friday constant, with nausea. Denies any vomiting, pt. Woke up this am with blurred vision.

## 2011-09-20 NOTE — ED Provider Notes (Signed)
History     CSN: [id number]  Arrival date & time 09/20/11  0820   First MD Initiated Contact with Patient 09/20/11 0848      Chief Complaint  Patient presents with  . Migraine    (Consider location/radiation/quality/duration/timing/severity/associated sxs/prior treatment) HPI Comments: The patient reports he has had a migraine headache x 5 days.  The pain is on the left side of his head and behind his left eye and feels like his previous bad migraines.  The last time he had a migraine that was this bad was approximately 1 year ago.  Associated nausea and blurry vision, which is also typical of his migraines.  Pain is exacerbated by light and sound.  Has been taking tylenol and benadryl at home without improvement.  Denies fevers, head injury, neck stiffness, "worst" headache of his life, focal neurological deficits.   Patient is a 36 y.o. male presenting with migraine. The history is provided by the patient.  Migraine Associated symptoms include headaches. Pertinent negatives include no abdominal pain, chest pain, coughing, fever, neck pain, numbness, sore throat or weakness.    Past Medical History  Diagnosis Date  . Migraines     History reviewed. No pertinent past surgical history.  History reviewed. No pertinent family history.  History  Substance Use Topics  . Smoking status: Current Everyday Smoker    Types: Cigarettes  . Smokeless tobacco: Never Used  . Alcohol Use: Yes      Review of Systems  Constitutional: Negative for fever.  HENT: Negative for sore throat, neck pain and neck stiffness.   Eyes: Positive for photophobia and visual disturbance.       Blurry vision.  No diplopia.  Respiratory: Negative for cough and shortness of breath.   Cardiovascular: Negative for chest pain.  Gastrointestinal: Negative for abdominal pain.  Neurological: Positive for headaches. Negative for dizziness, weakness, light-headedness and numbness.  All other systems reviewed and  are negative.    Allergies  Review of patient's allergies indicates no known allergies.  Home Medications   Current Outpatient Rx  Name Route Sig Dispense Refill  . ACETAMINOPHEN 500 MG PO TABS Oral Take 1,000-2,000 mg by mouth every 6 (six) hours as needed. For migraine    . DIPHENHYDRAMINE HCL (SLEEP) 25 MG PO TABS Oral Take 50-100 mg by mouth daily as needed. For sleep to assit with migraine      BP 151/94  Pulse 91  Temp(Src) 98.2 F (36.8 C) (Oral)  Resp 16  Ht 6' (1.829 m)  Wt 230 lb (104.327 kg)  BMI 31.19 kg/m2  SpO2 98%  Physical Exam  Constitutional: He is oriented to person, place, and time. He appears well-developed and well-nourished.  HENT:  Head: Normocephalic and atraumatic.  Neck: Neck supple.  Cardiovascular: Normal rate, regular rhythm and normal heart sounds.   Pulmonary/Chest: Breath sounds normal. No respiratory distress. He has no wheezes. He has no rales. He exhibits no tenderness.  Abdominal: Soft. Bowel sounds are normal. There is no tenderness.  Neurological: He is alert and oriented to person, place, and time. He has normal strength. No cranial nerve deficit or sensory deficit. GCS eye subscore is 4. GCS verbal subscore is 5. GCS motor subscore is 6.       Finger to nose, rapid alternating movements, heel to shin normal.  EOMs intact.  Grip strengths equal bilaterally.  Strength 5/5 throughout.  No pronator drift.      ED Course  Procedures (including critical care time)  Labs Reviewed - No data to display No results found.  12:26 PM Patient states pain continues to be 10/10.  Confirms that this headache is exactly like his previous migraines - same course and quality, associated features.  States Demerol used to be the only medication that helped his headache when it was this bad.  Discussed narcotic use for migraines and the possibility of a rebound headache, patient states he would prefer to have the pain relief even with the possibility of  rebound.    1:18 PM Patient states headache is starting to "ease up" after 1mg  Dilaudid.  States pain is now 8-9/10, but is appearing very comfortable.   1:33 PM Patient clarifies that the 8/10 is based on 10/10 being his worst headache.    2:13 PM Patient continues to improve, now 6/10, states blurry vision has subsided.  On reexamination, A&Ox4, NAD, CN II-XII intact, EOMs intact, no pronator drift, grip strengths strong and equal bilaterally, strength 5/5 in all extremities, sensation intact; finger to nose, rapid alternating movements, and heel to shin normal; gait is normal.    3:10 PM Patient reports he is feeling much better and would like to be d/c home.    1. Migraine       MDM  Patient with migraine headache that is typical of his migraine headaches.  Neurologically intact.  No fever, head trauma, "worst" headache, neck stiffness, focal neurological deficits.  Pain relief achieved in ED.  [facility] and other local resources.          Rise Patience, [place] 09/20/11 1528

## 2011-09-20 NOTE — ED Notes (Addendum)
Pt c/o migraine headache, nausea, blurred vision and sensitivity to light x5 days. Pt reports using Benadryl and Tylenol at home w/no relief, hx of migraines.

## 2011-09-21 NOTE — ED Provider Notes (Signed)
Medical screening examination/treatment/procedure(s) were performed by non-physician practitioner and as supervising physician I was immediately available for consultation/collaboration.   Shaunette Gassner M Haidee Stogsdill, DO 09/21/11 0716 

## 2011-10-02 ENCOUNTER — Encounter (HOSPITAL_COMMUNITY): Payer: Self-pay | Admitting: Emergency Medicine

## 2011-10-02 ENCOUNTER — Emergency Department (HOSPITAL_COMMUNITY)
Admission: EM | Admit: 2011-10-02 | Discharge: 2011-10-02 | Disposition: A | Discharge status: Home / Self Care | Payer: Self-pay | Attending: Emergency Medicine | Admitting: Emergency Medicine

## 2011-10-02 DIAGNOSIS — G43909 Migraine, unspecified, not intractable, without status migrainosus: Secondary | ICD-10-CM

## 2011-10-02 DIAGNOSIS — F329 Major depressive disorder, single episode, unspecified: Secondary | ICD-10-CM | POA: Insufficient documentation

## 2011-10-02 DIAGNOSIS — H53149 Visual discomfort, unspecified: Secondary | ICD-10-CM | POA: Insufficient documentation

## 2011-10-02 DIAGNOSIS — F3289 Other specified depressive episodes: Secondary | ICD-10-CM | POA: Insufficient documentation

## 2011-10-02 HISTORY — DX: Major depressive disorder, single episode, unspecified: F32.9

## 2011-10-02 HISTORY — DX: Depression, unspecified: F32.A

## 2011-10-02 MED ORDER — DIPHENHYDRAMINE HCL 50 MG/ML IJ SOLN
25.0000 mg | Freq: Once | INTRAMUSCULAR | Status: AC
  Administered 2011-10-02: 50 mg via INTRAMUSCULAR
  Filled 2011-10-02: qty 1

## 2011-10-02 MED ORDER — SUMATRIPTAN SUCCINATE 100 MG PO TABS: ORAL_TABLET | ORAL | Status: DC

## 2011-10-02 MED ORDER — KETOROLAC TROMETHAMINE 60 MG/2ML IM SOLN
60.0000 mg | Freq: Once | INTRAMUSCULAR | Status: AC
  Administered 2011-10-02: 60 mg via INTRAMUSCULAR
  Filled 2011-10-02: qty 2

## 2011-10-02 MED ORDER — METOCLOPRAMIDE HCL 5 MG/ML IJ SOLN
10.0000 mg | Freq: Once | INTRAMUSCULAR | Status: AC
  Administered 2011-10-02: 10 mg via INTRAMUSCULAR
  Filled 2011-10-02: qty 2

## 2011-10-02 NOTE — ED Provider Notes (Signed)
Medical screening examination/treatment/procedure(s) were performed by non-physician practitioner and as supervising physician I was immediately available for consultation/collaboration. Devoria Albe, MD, Armando Gang   Ward Givens, MD 10/02/11 [phone]

## 2011-10-02 NOTE — ED Notes (Signed)
States was seen  Just recently for same states has no one to follow up with had a disagreement w/ his dr was not given any rx to take home states has been  Smelling different things th at no one smells he staes

## 2011-10-02 NOTE — ED Notes (Signed)
H/a w/ sensory changes since waking up this am hx of migrains

## 2011-10-02 NOTE — ED Provider Notes (Signed)
History     CSN: [id number]  Arrival date & time 10/02/11  4098   First MD Initiated Contact with Patient 10/02/11 [phone]      Chief Complaint  Patient presents with  . Headache    (Consider location/radiation/quality/duration/timing/severity/associated sxs/prior treatment) HPI  Patient presents to emergency department complaining of gradual onset headache that began this morning. Patient states he has history of migraine headaches. Patient was recently seen in ER on January 16 for migraine headache. Patient states the headache is associated with phonophobia, photophobia, and a preceding aura of smelling a "strange soapy smell." Patient states the migraine headache is similar to his long-standing history of recurrent migraines. Patient was given a followup with [facility] but has not followed up with them yet. Patient states symptoms were gradual onset, persistent, and unchanging. Patient states he did not take any medication prior to arrival. Symptoms are aggravated by light and sound but denies alleviating factors. Denies radiation of pain. Denies fevers, chills, dizziness, visual changes, neck stiffness, rash, nausea, vomiting, difficulty ambulating, or loss of coordination.   Past Medical History  Diagnosis Date  . Migraines   . Depression     No past surgical history on file.  No family history on file.  History  Substance Use Topics  . Smoking status: Current Everyday Smoker    Types: Cigarettes  . Smokeless tobacco: Never Used  . Alcohol Use: Yes      Review of Systems  All other systems reviewed and are negative.    Allergies  Review of patient's allergies indicates no known allergies.  Home Medications   Current Outpatient Rx  Name Route Sig Dispense Refill  . ACETAMINOPHEN 500 MG PO TABS Oral Take 1,000-2,000 mg by mouth every 6 (six) hours as needed. For migraine    . ASPIRIN-ACETAMINOPHEN-CAFFEINE 250-250-65 MG PO TABS Oral Take 2-4  tablets by mouth every 6 (six) hours as needed. For headache    . DIPHENHYDRAMINE HCL (SLEEP) 25 MG PO TABS Oral Take 50-100 mg by mouth daily as needed. For sleep to assit with migraine    . SUMATRIPTAN SUCCINATE 100 MG PO TABS  Take one tablet by mouth and may repeat in 2 hours if ongoing headache 10 tablet 0    BP 154/93  Pulse 83  Temp(Src) 98.2 F (36.8 C) (Oral)  Resp 16  SpO2 99%  Physical Exam  Nursing note and vitals reviewed. Constitutional: He is oriented to person, place, and time. He appears well-developed and well-nourished. No distress.  HENT:  Head: Normocephalic and atraumatic.  Eyes: Conjunctivae and EOM are normal. Pupils are equal, round, and reactive to light.  Neck: Normal range of motion. Neck supple. No Brudzinski's sign and no Kernig's sign noted.  Cardiovascular: Normal rate, regular rhythm, normal heart sounds and intact distal pulses.  Exam reveals no gallop and no friction rub.   No murmur heard. Pulmonary/Chest: Effort normal and breath sounds normal. No respiratory distress. He has no wheezes. He has no rales. He exhibits no tenderness.  Abdominal: Soft. Bowel sounds are normal. He exhibits no distension and no mass. There is no tenderness. There is no rebound and no guarding.  Musculoskeletal: Normal range of motion. He exhibits no edema and no tenderness.  Neurological: He is alert and oriented to person, place, and time. No cranial nerve deficit. Coordination normal.  Skin: Skin is warm and dry. No rash noted. He is not diaphoretic. No erythema.  Psychiatric: He has a normal mood and  affect.    ED Course  Procedures (including critical care time)  IM toradol, IM reglan, IM benadryl.  Patient is requesting IV Dilaudid for his migraine. Despite discussing at length with patient the possible negative side effects of rebound headache with narcotic pain medication use with migraines and that narcotics are not advised for migraine treatment patient still  is requesting Dilaudid. Patient was given the same warnings last visit and was requesting Dilaudid. At this point I question some drug-seeking behavior. I discussed at length with the patient  That he would be treated for his migraine pain in the ER with appropriate migraine management of IM Reglan, IM Toradol, and IM Benadryl but that long-standing history of recurrent migraine becomes a chronic medical problem and that seeking specialized treatment with a neurologist or a [facility] is ultimately in the best interest of the patient for ongoing management of chronic or recurrent migraine. Patient voices his understanding.   Labs Reviewed - No data to display No results found.   1. MIGRAINE HEADACHE       MDM  History of recurrent migraine with patient describing his current migraine is the same migraine he has on a regular recurrent basis over many years. He is afebrile and nontoxic-appearing. He has no neuro focal findings. He is ambulating without difficulty and without ataxia.        Jenness Corner, [place] 10/02/11 1038

## 2012-01-19 ENCOUNTER — Emergency Department (HOSPITAL_COMMUNITY)
Admission: EM | Admit: 2012-01-19 | Discharge: 2012-01-19 | Disposition: A | Discharge status: Home / Self Care | Payer: Self-pay | Attending: Emergency Medicine | Admitting: Emergency Medicine

## 2012-01-19 ENCOUNTER — Emergency Department (HOSPITAL_COMMUNITY): Payer: Self-pay

## 2012-01-19 DIAGNOSIS — M549 Dorsalgia, unspecified: Secondary | ICD-10-CM | POA: Insufficient documentation

## 2012-01-19 DIAGNOSIS — M25461 Effusion, right knee: Secondary | ICD-10-CM

## 2012-01-19 DIAGNOSIS — X500XXA Overexertion from strenuous movement or load, initial encounter: Secondary | ICD-10-CM | POA: Insufficient documentation

## 2012-01-19 DIAGNOSIS — M25561 Pain in right knee: Secondary | ICD-10-CM

## 2012-01-19 DIAGNOSIS — M25569 Pain in unspecified knee: Secondary | ICD-10-CM | POA: Insufficient documentation

## 2012-01-19 DIAGNOSIS — M25469 Effusion, unspecified knee: Secondary | ICD-10-CM | POA: Insufficient documentation

## 2012-01-19 LAB — GRAM STAIN: Special Requests: NORMAL

## 2012-01-19 LAB — SYNOVIAL CELL COUNT + DIFF, W/ CRYSTALS
Lymphocytes-Synovial Fld: 90 % — ABNORMAL HIGH (ref 0–20)
Monocyte-Macrophage-Synovial Fluid: 2 % — ABNORMAL LOW (ref 50–90)
WBC, Synovial: 68 /mm3 (ref 0–200)

## 2012-01-19 MED ORDER — HYDROMORPHONE HCL PF 2 MG/ML IJ SOLN
2.0000 mg | Freq: Once | INTRAMUSCULAR | Status: AC
  Administered 2012-01-19: 2 mg via INTRAMUSCULAR
  Filled 2012-01-19: qty 1

## 2012-01-19 MED ORDER — IBUPROFEN 800 MG PO TABS: 800.0000 mg | ORAL_TABLET | Freq: Three times a day (TID) | ORAL | Status: AC | PRN

## 2012-01-19 MED ORDER — PERCOCET 5-325 MG PO TABS: 1.0000 | ORAL_TABLET | Freq: Four times a day (QID) | ORAL | Status: AC | PRN

## 2012-01-19 MED ORDER — HYDROCODONE-ACETAMINOPHEN 5-325 MG PO TABS
2.0000 | ORAL_TABLET | Freq: Once | ORAL | Status: AC
  Administered 2012-01-19: 2 via ORAL
  Filled 2012-01-19: qty 2

## 2012-01-19 MED ORDER — LIDOCAINE HCL 2 % IJ SOLN
20.0000 mL | Freq: Once | INTRAMUSCULAR | Status: AC
  Administered 2012-01-19: 400 mg
  Filled 2012-01-19: qty 1

## 2012-01-19 NOTE — ED Provider Notes (Signed)
History     CSN: [id number]  Arrival date & time 01/19/12  1043   First MD Initiated Contact with Patient 01/19/12 1058      Chief Complaint  Patient presents with  . Knee Pain    (Consider location/radiation/quality/duration/timing/severity/associated sxs/prior treatment) HPI Comments: Patient reports he was weed eating in his yard 5 days ago with no known injury, states just after that he developed pain in the medial aspect of his right knee.  Reports pain is dull and aching unless he tries to bear weight, then it becomes sharp.  Pain is also exacerbated by attempting to fully straighten or bend more than 90 degrees.  Associated swelling.  Has had tingling in his right foot since his pain began, but no weakness.  Pt has chronic back pain that has gotten worse as he has been limping and attempting to compensate for the pain with his gait.  Is taking tylenol and ibuprofen without improvement.  Denies fevers.  Had surgery on this knee at age 36 to have "debris" cleaned out, also states they thought a "bone was dead...they drilled holes in it to see if that would help."  Is unsure of which orthopedist he saw.  Never had to have a repeat surgery or any further treatment.    Patient is a 36 y.o. male presenting with knee pain. The history is provided by the patient.  Knee Pain Associated symptoms include joint swelling. Pertinent negatives include no chills, fatigue, fever or weakness.    Past Medical History  Diagnosis Date  . Migraines   . Depression     No past surgical history on file.  No family history on file.  History  Substance Use Topics  . Smoking status: Current Everyday Smoker    Types: Cigarettes  . Smokeless tobacco: Never Used  . Alcohol Use: Yes      Review of Systems  Constitutional: Negative for fever, chills, activity change, appetite change and fatigue.  Musculoskeletal: Positive for back pain and joint swelling.  Neurological: Negative for weakness.     Allergies  Review of patient's allergies indicates no known allergies.  Home Medications   Current Outpatient Rx  Name Route Sig Dispense Refill  . ACETAMINOPHEN 500 MG PO TABS Oral Take 1,000-2,000 mg by mouth every 6 (six) hours as needed. For migraine    . DIPHENHYDRAMINE HCL 25 MG PO TABS Oral Take 25-50 mg by mouth every 6 (six) hours as needed. For sleep/allergies.    . IBUPROFEN 200 MG PO TABS Oral Take 600 mg by mouth every 8 (eight) hours as needed. For pain.    . SUMATRIPTAN SUCCINATE 100 MG PO TABS  Take one tablet by mouth and may repeat in 2 hours if ongoing headache 10 tablet 0    BP 119/73  Pulse 85  Temp(Src) 98.5 F (36.9 C) (Oral)  Resp 18  SpO2 99%  Physical Exam  Nursing note and vitals reviewed. Constitutional: He is oriented to person, place, and time. He appears well-developed and well-nourished.  HENT:  Head: Normocephalic and atraumatic.  Neck: Neck supple.  Pulmonary/Chest: Effort normal.  Musculoskeletal:       Right knee: He exhibits decreased range of motion and swelling. He exhibits no ecchymosis, no deformity and no laceration. tenderness found. Medial joint line tenderness noted.       Left knee: Normal.       Right ankle: Normal.       Right knee with mild swelling just superior to  joint.  Pain with anterior stress and valgus stress.  Unable to do thessaly test due to inability to comfortably bear weight on right leg.    Neurological: He is alert and oriented to person, place, and time.  Skin: He is not diaphoretic.  Psychiatric: He has a normal mood and affect. His behavior is normal. Judgment and thought content normal.    ED Course  ARTHOCENTESIS Date/Time: 01/19/2012 1:26 PM Performed by: Trixie Dredge B Authorized by: Trixie Dredge B Consent: Verbal consent obtained. Risks and benefits: risks, benefits and alternatives were discussed Consent given by: patient Patient understanding: patient states understanding of the procedure being  performed Site marked: the operative site was marked Imaging studies: imaging studies available Patient identity confirmed: verbally with patient Indications: joint swelling and pain  Body area: knee Joint: right knee Local anesthesia used: yes Anesthesia: local infiltration Local anesthetic: lidocaine 2% without epinephrine Patient sedated: no Preparation: Patient was prepped and draped in the usual sterile fashion. Needle gauge: 18 G Approach: medial Aspirate: blood-tinged and serous Aspirate amount: 35 ml Patient tolerance: Patient tolerated the procedure well with no immediate complications.   (including critical care time)  Labs Reviewed  CELL COUNT + DIFF,  W/ CRYST-SYNVL FLD - Abnormal; Notable for the following:    Color, Synovial ORANGE (*)    Appearance-Synovial TURBID (*)    Lymphocytes-Synovial Fld 90 (*)    Monocyte-Macrophage-Synovial Fluid 2 (*)    All other components within normal limits  GRAM STAIN  GLUCOSE, SYNOVIAL FLUID  PROTEIN, BODY FLUID  BODY FLUID CULTURE   Dg Knee Complete 4 Views Right  01/19/2012  *RADIOLOGY REPORT*  Clinical Data: 36 year old male with right knee pain.  Twisting injury.  Prior surgery in 1996.  RIGHT KNEE - COMPLETE 4+ VIEW  Comparison: 04/27/2011.  Findings: Moderate sized joint effusion.  Patella intact.  Stable bone mineralization.  Stable joint spaces.  Small chronic bony fragment just above the lateral femoral condyle.  No acute fracture or dislocation.  IMPRESSION: Moderate joint effusion.  No acute osseous abnormality about the right knee.  Original Report Authenticated By: Harley Hallmark, M.D.   2:23 PM Called lab to check on results.  All labs still pending.  Glucose and protein must be done at [facility].      No organisms seen on gram stain.  Rare WBCs.   1. Knee effusion, right   2. Right knee pain       MDM  Afebrile, nontoxic patient with right knee pain after doing yard work 5 days ago.  Pt has moderate joint  effusion.  Arthrocentesis performed in ED with good relief of pressure.  Fluid showed no organisms, rare WBC.  Fluid is straw colored with slight tinge from blood.  Doubt septic joint.   Pt put in knee sleeve and crutches, d/c home with percocet, orthopedic follow up.  Return precautions given.  Patient verbalizes understanding and agrees with plan.          Rise Patience, [place] 01/19/12 1600

## 2012-01-19 NOTE — ED Provider Notes (Signed)
Medical screening examination/treatment/procedure(s) were performed by non-physician practitioner and as supervising physician I was immediately available for consultation/collaboration.  Alyra Patty, MD 01/19/12 1650 

## 2012-01-19 NOTE — Discharge Instructions (Signed)
Call the orthopedist above or the orthopedist of your choice for a close follow up appointment for your knee pain.  Please do not drive and do not take additional tylenol while taking the prescribed pain medication.  If you develop fevers, redness around your knee, increased swelling, or uncontrolled pain, return to the ER immediately for a recheck.  You may return to the ER at any time for worsening condition or any new symptoms that concern you.   Knee Effusion The medical term for having fluid in your knee is effusion. This is often due to an internal derangement of the knee. This means something is wrong inside the knee. Some of the causes of fluid in the knee may be torn cartilage, a torn ligament, or bleeding into the joint from an injury. Your knee is likely more difficult to bend and move. This is often because there is increased pain and pressure in the joint. The time it takes for recovery from a knee effusion depends on different factors, including:   Type of injury.   Your age.   Physical and medical conditions.   Rehabilitation Strategies.  How long you will be away from your normal activities will depend on what kind of knee problem you have and how much damage is present. Your knee has two types of cartilage. Articular cartilage covers the bone ends and lets your knee bend and move smoothly. Two menisci, thick pads of cartilage that form a rim inside the joint, help absorb shock and stabilize your knee. Ligaments bind the bones together and support your knee joint. Muscles move the joint, help support your knee, and take stress off the joint itself. CAUSES  Often an effusion in the knee is caused by an injury to one of the menisci. This is often a tear in the cartilage. Recovery after a meniscus injury depends on how much meniscus is damaged and whether you have damaged other knee tissue. Small tears may heal on their own with conservative treatment. Conservative means rest, limited  weight bearing activity and muscle strengthening exercises. Your recovery may take up to 6 weeks.  TREATMENT  Larger tears may require surgery. Meniscus injuries may be treated during arthroscopy. Arthroscopy is a procedure in which your surgeon uses a small telescope like instrument to look in your knee. Your caregiver can make a more accurate diagnosis (learning what is wrong) by performing an arthroscopic procedure. If your injury is on the inner margin of the meniscus, your surgeon may trim the meniscus back to a smooth rim. In other cases your surgeon will try to repair a damaged meniscus with stitches (sutures). This may make rehabilitation take longer, but may provide better long term result by helping your knee keep its shock absorption capabilities. Ligaments which are completely torn usually require surgery for repair. HOME CARE INSTRUCTIONS  Use crutches as instructed.   If a brace is applied, use as directed.   Once you are home, an ice pack applied to your swollen knee may help with discomfort and help decrease swelling.   Keep your knee raised (elevated) when you are not up and around or on crutches.   Only take over-the-counter or prescription medicines for pain, discomfort, or fever as directed by your caregiver.   Your caregivers will help with instructions for rehabilitation of your knee. This often includes strengthening exercises.   You may resume a normal diet and activities as directed.  SEEK MEDICAL CARE IF:   There is increased swelling in  your knee.   You notice redness, swelling, or increasing pain in your knee.   An unexplained oral temperature above 102 F (38.9 C) develops.  SEEK IMMEDIATE MEDICAL CARE IF:   You develop a rash.   You have difficulty breathing.   You have any allergic reactions from medications you may have been given.   There is severe pain with any motion of the knee.  MAKE SURE YOU:   Understand these instructions.   Will watch  your condition.   Will get help right away if you are not doing well or get worse.  Document Released: 11/11/2003 Document Revised: 08/10/2011 Document Reviewed: 01/15/2008 [facility] Patient Information 2012 [place], [place].   Knee Pain The knee is the complex joint between your thigh and your lower leg. It is made up of bones, tendons, ligaments, and cartilage. The bones that make up the knee are:  The femur in the thigh.   The tibia and fibula in the lower leg.   The patella or kneecap riding in the groove on the lower femur.  CAUSES  Knee pain is a common complaint with many causes. A few of these causes are:  Injury, such as:   A ruptured ligament or tendon injury.   Torn cartilage.   Medical conditions, such as:   Gout   Arthritis   Infections   Overuse, over training or overdoing a physical activity.  Knee pain can be minor or severe. Knee pain can accompany debilitating injury. Minor knee problems often respond well to self-care measures or get well on their own. More serious injuries may need medical intervention or even surgery. SYMPTOMS The knee is complex. Symptoms of knee problems can vary widely. Some of the problems are:  Pain with movement and weight bearing.   Swelling and tenderness.   Buckling of the knee.   Inability to straighten or extend your knee.   Your knee locks and you cannot straighten it.   Warmth and redness with pain and fever.   Deformity or dislocation of the kneecap.  DIAGNOSIS  Determining what is wrong may be very straight forward such as when there is an injury. It can also be challenging because of the complexity of the knee. Tests to make a diagnosis may include:  Your caregiver taking a history and doing a physical exam.   Routine X-rays can be used to rule out other problems. X-rays will not reveal a cartilage tear. Some injuries of the knee can be diagnosed by:   Arthroscopy a surgical technique by which a small video  camera is inserted through tiny incisions on the sides of the knee. This procedure is used to examine and repair internal knee joint problems. Tiny instruments can be used during arthroscopy to repair the torn knee cartilage (meniscus).   Arthrography is a radiology technique. A contrast liquid is directly injected into the knee joint. Internal structures of the knee joint then become visible on X-ray film.   An MRI scan is a non x-ray radiology procedure in which magnetic fields and a computer produce two- or three-dimensional images of the inside of the knee. Cartilage tears are often visible using an MRI scanner. MRI scans have largely replaced arthrography in diagnosing cartilage tears of the knee.   Blood work.   Examination of the fluid that helps to lubricate the knee joint (synovial fluid). This is done by taking a sample out using a needle and a syringe.  TREATMENT The treatment of knee problems depends  on the cause. Some of these treatments are:  Depending on the injury, proper casting, splinting, surgery or physical therapy care will be needed.   Give yourself adequate recovery time. Do not overuse your joints. If you begin to get sore during workout routines, back off. Slow down or do fewer repetitions.   For repetitive activities such as cycling or running, maintain your strength and nutrition.   Alternate muscle groups. For example if you are a weight lifter, work the upper body on one day and the lower body the next.   Either tight or weak muscles do not give the proper support for your knee. Tight or weak muscles do not absorb the stress placed on the knee joint. Keep the muscles surrounding the knee strong.   Take care of mechanical problems.   If you have flat feet, orthotics or special shoes may help. See your caregiver if you need help.   Arch supports, sometimes with wedges on the inner or outer aspect of the heel, can help. These can shift pressure away from the side  of the knee most bothered by osteoarthritis.   A brace called an "unloader" brace also may be used to help ease the pressure on the most arthritic side of the knee.   If your caregiver has prescribed crutches, braces, wraps or ice, use as directed. The acronym for this is PRICE. This means protection, rest, ice, compression and elevation.   Nonsteroidal anti-inflammatory drugs (NSAID's), can help relieve pain. But if taken immediately after an injury, they may actually increase swelling. Take NSAID's with food in your stomach. Stop them if you develop stomach problems. Do not take these if you have a history of ulcers, stomach pain or bleeding from the bowel. Do not take without your caregiver's approval if you have problems with fluid retention, heart failure, or kidney problems.   For ongoing knee problems, physical therapy may be helpful.   Glucosamine and chondroitin are over-the-counter dietary supplements. Both may help relieve the pain of osteoarthritis in the knee. These medicines are different from the usual anti-inflammatory drugs. Glucosamine may decrease the rate of cartilage destruction.   Injections of a corticosteroid drug into your knee joint may help reduce the symptoms of an arthritis flare-up. They may provide pain relief that lasts a few months. You may have to wait a few months between injections. The injections do have a small increased risk of infection, water retention and elevated blood sugar levels.   Hyaluronic acid injected into damaged joints may ease pain and provide lubrication. These injections may work by reducing inflammation. A series of shots may give relief for as long as 6 months.   Topical painkillers. Applying certain ointments to your skin may help relieve the pain and stiffness of osteoarthritis. Ask your pharmacist for suggestions. Many over the-counter products are approved for temporary relief of arthritis pain.   In some countries, doctors often  prescribe topical NSAID's for relief of chronic conditions such as arthritis and tendinitis. A review of treatment with NSAID creams found that they worked as well as oral medications but without the serious side effects.  PREVENTION  Maintain a healthy weight. Extra pounds put more strain on your joints.   Get strong, stay limber. Weak muscles are a common cause of knee injuries. Stretching is important. Include flexibility exercises in your workouts.   Be smart about exercise. If you have osteoarthritis, chronic knee pain or recurring injuries, you may need to change the way you  exercise. This does not mean you have to stop being active. If your knees ache after jogging or playing basketball, consider switching to swimming, water aerobics or other low-impact activities, at least for a few days a week. Sometimes limiting high-impact activities will provide relief.   Make sure your shoes fit well. Choose footwear that is right for your sport.   Protect your knees. Use the proper gear for knee-sensitive activities. Use kneepads when playing volleyball or laying carpet. Buckle your seat belt every time you drive. Most shattered kneecaps occur in car accidents.   Rest when you are tired.  SEEK MEDICAL CARE IF:  You have knee pain that is continual and does not seem to be getting better.  SEEK IMMEDIATE MEDICAL CARE IF:  Your knee joint feels hot to the touch and you have a high fever. MAKE SURE YOU:   Understand these instructions.   Will watch your condition.   Will get help right away if you are not doing well or get worse.  Document Released: 06/18/2007 Document Revised: 08/10/2011 Document Reviewed: 06/18/2007 [facility] Patient Information 2012 [place], [place].

## 2012-01-19 NOTE — ED Notes (Signed)
After weed-eating on Monday, started with right knee pain.

## 2012-01-19 NOTE — ED Notes (Signed)
Hx of right knee sx 17 years ago.

## 2012-01-20 LAB — GLUCOSE, SYNOVIAL FLUID: Glucose, Synovial Fluid: 101 mg/dL

## 2012-01-23 LAB — BODY FLUID CULTURE

## 2012-04-12 ENCOUNTER — Emergency Department (HOSPITAL_COMMUNITY): Payer: Self-pay

## 2012-04-12 ENCOUNTER — Encounter (HOSPITAL_COMMUNITY): Payer: Self-pay | Admitting: Emergency Medicine

## 2012-04-12 ENCOUNTER — Emergency Department (HOSPITAL_COMMUNITY)
Admission: EM | Admit: 2012-04-12 | Discharge: 2012-04-12 | Disposition: A | Discharge status: Home / Self Care | Payer: Self-pay | Attending: Emergency Medicine | Admitting: Emergency Medicine

## 2012-04-12 DIAGNOSIS — F17200 Nicotine dependence, unspecified, uncomplicated: Secondary | ICD-10-CM | POA: Insufficient documentation

## 2012-04-12 DIAGNOSIS — M25569 Pain in unspecified knee: Secondary | ICD-10-CM | POA: Insufficient documentation

## 2012-04-12 DIAGNOSIS — M25561 Pain in right knee: Secondary | ICD-10-CM

## 2012-04-12 MED ORDER — OXYCODONE-ACETAMINOPHEN 5-325 MG PO TABS: 1.0000 | ORAL_TABLET | ORAL | Status: AC | PRN

## 2012-04-12 MED ORDER — HYDROMORPHONE HCL PF 2 MG/ML IJ SOLN
2.0000 mg | Freq: Once | INTRAMUSCULAR | Status: AC
  Administered 2012-04-12: 2 mg via INTRAMUSCULAR
  Filled 2012-04-12: qty 1

## 2012-04-12 MED ORDER — IBUPROFEN 600 MG PO TABS: 600.0000 mg | ORAL_TABLET | Freq: Three times a day (TID) | ORAL | Status: AC | PRN

## 2012-04-12 MED ORDER — IBUPROFEN 200 MG PO TABS
600.0000 mg | ORAL_TABLET | Freq: Once | ORAL | Status: AC
  Administered 2012-04-12: 600 mg via ORAL
  Filled 2012-04-12: qty 3

## 2012-04-12 NOTE — ED Provider Notes (Signed)
History     CSN: [id number]  Arrival date & time 04/12/12  0745   First MD Initiated Contact with Patient 04/12/12 [phone]      Chief Complaint  Patient presents with  . Knee Pain     The history is provided by the patient.   patient reports long-standing history of right knee pain.  He reports he was walking yesterday and felt a pop in his right knee when he planted and twisted his right knee.  He denies weakness or numbness of his right leg.  He reports moderate to severe pain.  His pain has been unrelieved with over-the-counter anti-inflammatory medicines.  His had prior right knee surgery but reports that he cannot return to his orthopedic surgeon secondary to an inability to pay his bill.  He requests referral to another orthopedic surgeon.  He denies fevers or chills.  His had no redness.  He has no history of gout.  Past Medical History  Diagnosis Date  . Migraines   . Depression     Past Surgical History  Procedure Date  . Knee surgery   . Back surgery     History reviewed. No pertinent family history.  History  Substance Use Topics  . Smoking status: Current Everyday Smoker    Types: Cigarettes  . Smokeless tobacco: Never Used  . Alcohol Use: Yes      Review of Systems  All other systems reviewed and are negative.    Allergies  Review of patient's allergies indicates no known allergies.  Home Medications   Current Outpatient Rx  Name Route Sig Dispense Refill  . ACETAMINOPHEN 500 MG PO TABS Oral Take 1,000-2,000 mg by mouth every 6 (six) hours as needed. For migraine    . CALCIUM CARBONATE ANTACID 500 MG PO CHEW Oral Chew 1 tablet by mouth 3 (three) times daily as needed. heartburn    . DIPHENHYDRAMINE HCL 25 MG PO TABS Oral Take 25-50 mg by mouth every 6 (six) hours as needed. For sleep/allergies.    . IBUPROFEN 200 MG PO TABS Oral Take 600 mg by mouth every 8 (eight) hours as needed. For pain.    Marland Kitchen RANITIDINE HCL 150 MG PO TABS Oral Take 300 mg by mouth  daily.    . IBUPROFEN 600 MG PO TABS Oral Take 1 tablet (600 mg total) by mouth every 8 (eight) hours as needed for pain. 15 tablet 0  . OXYCODONE-ACETAMINOPHEN 5-325 MG PO TABS Oral Take 1 tablet by mouth every 4 (four) hours as needed for pain. 20 tablet 0    BP 156/78  Pulse 71  Temp 98.2 F (36.8 C) (Oral)  Resp 16  SpO2 99%  Physical Exam  Constitutional: He is oriented to person, place, and time. He appears well-developed and well-nourished.  HENT:  Head: Normocephalic.  Eyes: EOM are normal.  Neck: Normal range of motion.  Pulmonary/Chest: Effort normal.  Abdominal: He exhibits no distension.  Musculoskeletal: Normal range of motion.       Tenderness along the medial joint line of the right knee.  No obvious effusion.  No ligamentous laxity noted.  Normal pulses distally  Neurological: He is alert and oriented to person, place, and time.  Psychiatric: He has a normal mood and affect.    ED Course  Procedures (including critical care time)  Labs Reviewed - No data to display Dg Knee Complete 4 Views Right  04/12/2012  *RADIOLOGY REPORT*  Clinical Data: Knee pain.  Twisting injury.  RIGHT KNEE - COMPLETE 4+ VIEW  Comparison: 01/19/2012  Findings: There is a trace joint effusion, decreased since prior study.  Chronic bone fragment adjacent to the posterior lateral femoral condyle, stable.  No acute fracture, subluxation or dislocation.  Irregularity along the articular surface of the lateral femoral condyle.  Osteochondral defect noted in the lateral femoral condyle, stable since prior study.  The bone fragment adjacent to the posterior lateral femoral condyle is likely the bone fragment and is a loose body. No acute findings.  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  The bone fragment is seen adjacent to the posterior lateral femoral condyle.  Stable exam.  No acute findings.  Original Report Authenticated By: Cyndie Chime, M.D.    I personally reviewed the imaging tests through PACS system  I reviewed available ER/hospitalization records thought the EMR   1. Right knee pain       MDM  X-ray without fracture.  Discharge orthopedic followup.  Home with pain medicine and crutches.        Lyanne Co, MD 04/12/12 [phone]

## 2012-04-12 NOTE — ED Notes (Signed)
Pt states he was walking yesterday when his R knee gave out.  Heard a pop.  Pt is able to move leg, but states he is unable to get it completely straight.  Pt states pain is all around knee cap but worst on medial side with pain and numbness shooting down front of leg.  Pt states he has hx of knee problems since he was a teenager.  For the past couple of months he has been seeing knee doctor for knee drainage and cortisone shot.  Pt states that for the past couple of months it has felt like his knee cap is about to pop out when he sits.

## 2012-04-12 NOTE — ED Notes (Signed)
Patient transported to X-ray 

## 2012-04-23 ENCOUNTER — Other Ambulatory Visit (HOSPITAL_COMMUNITY): Payer: Self-pay | Admitting: Orthopaedic Surgery

## 2012-04-23 DIAGNOSIS — M25561 Pain in right knee: Secondary | ICD-10-CM

## 2012-04-25 ENCOUNTER — Ambulatory Visit (HOSPITAL_COMMUNITY)
Admission: RE | Admit: 2012-04-25 | Discharge: 2012-04-25 | Disposition: A | Discharge status: Home / Self Care | Payer: Self-pay | Source: Ambulatory Visit | Attending: Orthopaedic Surgery | Admitting: Orthopaedic Surgery

## 2012-04-25 ENCOUNTER — Other Ambulatory Visit: Payer: Self-pay | Admitting: Critical Care Medicine

## 2012-04-25 DIAGNOSIS — IMO0002 Reserved for concepts with insufficient information to code with codable children: Secondary | ICD-10-CM | POA: Insufficient documentation

## 2012-04-25 DIAGNOSIS — X500XXA Overexertion from strenuous movement or load, initial encounter: Secondary | ICD-10-CM | POA: Insufficient documentation

## 2012-04-25 DIAGNOSIS — M1710 Unilateral primary osteoarthritis, unspecified knee: Secondary | ICD-10-CM | POA: Insufficient documentation

## 2012-04-25 DIAGNOSIS — M25561 Pain in right knee: Secondary | ICD-10-CM

## 2012-04-25 DIAGNOSIS — S99929A Unspecified injury of unspecified foot, initial encounter: Secondary | ICD-10-CM | POA: Insufficient documentation

## 2012-04-25 DIAGNOSIS — S8990XA Unspecified injury of unspecified lower leg, initial encounter: Secondary | ICD-10-CM | POA: Insufficient documentation

## 2012-04-25 DIAGNOSIS — M25569 Pain in unspecified knee: Secondary | ICD-10-CM | POA: Insufficient documentation

## 2012-05-30 ENCOUNTER — Emergency Department (HOSPITAL_COMMUNITY)
Admission: EM | Admit: 2012-05-30 | Discharge: 2012-05-30 | Disposition: A | Discharge status: Home / Self Care | Payer: Self-pay | Attending: Emergency Medicine | Admitting: Emergency Medicine

## 2012-05-30 DIAGNOSIS — F3289 Other specified depressive episodes: Secondary | ICD-10-CM | POA: Insufficient documentation

## 2012-05-30 DIAGNOSIS — G8929 Other chronic pain: Secondary | ICD-10-CM | POA: Insufficient documentation

## 2012-05-30 DIAGNOSIS — M545 Low back pain, unspecified: Secondary | ICD-10-CM | POA: Insufficient documentation

## 2012-05-30 DIAGNOSIS — F17200 Nicotine dependence, unspecified, uncomplicated: Secondary | ICD-10-CM | POA: Insufficient documentation

## 2012-05-30 DIAGNOSIS — R52 Pain, unspecified: Secondary | ICD-10-CM | POA: Insufficient documentation

## 2012-05-30 DIAGNOSIS — F329 Major depressive disorder, single episode, unspecified: Secondary | ICD-10-CM | POA: Insufficient documentation

## 2012-05-30 MED ORDER — PREDNISONE 20 MG PO TABS
60.0000 mg | ORAL_TABLET | Freq: Once | ORAL | Status: AC
  Administered 2012-05-30: 60 mg via ORAL
  Filled 2012-05-30: qty 3

## 2012-05-30 MED ORDER — PREDNISONE 20 MG PO TABS: ORAL_TABLET | ORAL | Status: DC

## 2012-05-30 MED ORDER — HYDROCODONE-ACETAMINOPHEN 5-325 MG PO TABS: 1.0000 | ORAL_TABLET | Freq: Four times a day (QID) | ORAL | Status: DC | PRN

## 2012-05-30 MED ORDER — HYDROCODONE-ACETAMINOPHEN 5-325 MG PO TABS
1.0000 | ORAL_TABLET | Freq: Once | ORAL | Status: AC
  Administered 2012-05-30: 1 via ORAL
  Filled 2012-05-30: qty 1

## 2012-05-30 NOTE — ED Notes (Signed)
Pt c/o lower back pain. Pt states he was playing with nephew on Sun and fell backward. Pt states pain getting worse. Pt had back surgery to L-5 in 2007. Pt ambulatory at triage.

## 2012-05-30 NOTE — ED Notes (Signed)
Pt states he has a ride home

## 2012-05-30 NOTE — ED Provider Notes (Signed)
History     CSN: [id number]  Arrival date & time 05/30/12  1713   First MD Initiated Contact with Patient 05/30/12 1821      Chief Complaint  Patient presents with  . Back Pain    (Consider location/radiation/quality/duration/timing/severity/associated sxs/prior treatment) HPI Comments: Patient states he was wrestling with his nephew and he fell, landing squarely on his buttock has a history of low back pain.  Low back surgery has been taking over-the-counter ibuprofen, and tramadol.  She normally takes for his back pain, without any relief.  He normally radiates to his left leg.  This has not changed.  Has not lost sensation in the perineal area nor become incontinent or constipated  Patient is a 36 y.o. male presenting with back pain. The history is provided by the patient.  Back Pain  This is a recurrent problem. Pertinent negatives include no fever, no numbness, no dysuria and no weakness.    Past Medical History  Diagnosis Date  . Migraines   . Depression     Past Surgical History  Procedure Date  . Knee surgery   . Back surgery     No family history on file.  History  Substance Use Topics  . Smoking status: Current Every Day Smoker    Types: Cigarettes  . Smokeless tobacco: Never Used  . Alcohol Use: Yes      Review of Systems  Constitutional: Negative for fever and chills.  Genitourinary: Negative for dysuria, urgency and frequency.  Musculoskeletal: Positive for back pain.  Neurological: Negative for weakness and numbness.    Allergies  Review of patient's allergies indicates no known allergies.  Home Medications   Current Outpatient Rx  Name Route Sig Dispense Refill  . ACETAMINOPHEN 500 MG PO TABS Oral Take 1,000-2,000 mg by mouth every 6 (six) hours as needed. For migraine    . DIPHENHYDRAMINE HCL 25 MG PO TABS Oral Take 25-50 mg by mouth every 6 (six) hours as needed. For sleep/allergies.    . IBUPROFEN 200 MG PO TABS Oral Take 600 mg by mouth  every 8 (eight) hours as needed. For pain.    Marland Kitchen TRAMADOL HCL 50 MG PO TABS Oral Take 50 mg by mouth every 6 (six) hours as needed. pain    . HYDROCODONE-ACETAMINOPHEN 5-325 MG PO TABS Oral Take 1 tablet by mouth every 6 (six) hours as needed for pain. 7 tablet 0  . PREDNISONE 20 MG PO TABS  3 Tabs PO Days 1-3, then 2 tabs PO Days 4-6, then 1 tab PO Day 7-9, then Half Tab PO Day 10-12 20 tablet 0    BP 178/81  Pulse 98  Temp 98.7 F (37.1 C) (Oral)  Resp 15  SpO2 100%  Physical Exam  Constitutional: He appears well-developed and well-nourished.  HENT:  Head: Normocephalic.  Eyes: Pupils are equal, round, and reactive to light.  Neck: Normal range of motion.  Cardiovascular: Normal rate.   Pulmonary/Chest: He is in respiratory distress.  Musculoskeletal: Normal range of motion.       Ambulate without difficulty or limp  Neurological: He is alert.  Skin: Skin is warm.    ED Course  Procedures (including critical care time)  Labs Reviewed - No data to display No results found.   1. Exacerbation of chronic back pain       MDM  Exacerbation of chronic low back pain        Arman Filter, NP 05/30/12 1851  Cipriano Mile  Manus Rudd, NP 05/30/12 1851  Arman Filter, NP 05/30/12 1851

## 2012-05-30 NOTE — ED Provider Notes (Signed)
Medical screening examination/treatment/procedure(s) were performed by non-physician practitioner and as supervising physician I was immediately available for consultation/collaboration. Devoria Albe, MD, Armando Gang   Ward Givens, MD 05/30/12 [phone]

## 2012-06-30 ENCOUNTER — Emergency Department (HOSPITAL_COMMUNITY)
Admission: EM | Admit: 2012-06-30 | Discharge: 2012-06-30 | Disposition: A | Discharge status: Home / Self Care | Payer: Self-pay | Attending: Emergency Medicine | Admitting: Emergency Medicine

## 2012-06-30 ENCOUNTER — Encounter (HOSPITAL_COMMUNITY): Payer: Self-pay | Admitting: *Deleted

## 2012-06-30 DIAGNOSIS — F17200 Nicotine dependence, unspecified, uncomplicated: Secondary | ICD-10-CM | POA: Insufficient documentation

## 2012-06-30 DIAGNOSIS — G43909 Migraine, unspecified, not intractable, without status migrainosus: Secondary | ICD-10-CM | POA: Insufficient documentation

## 2012-06-30 DIAGNOSIS — J029 Acute pharyngitis, unspecified: Secondary | ICD-10-CM | POA: Insufficient documentation

## 2012-06-30 DIAGNOSIS — F3289 Other specified depressive episodes: Secondary | ICD-10-CM | POA: Insufficient documentation

## 2012-06-30 DIAGNOSIS — F329 Major depressive disorder, single episode, unspecified: Secondary | ICD-10-CM | POA: Insufficient documentation

## 2012-06-30 DIAGNOSIS — Z79899 Other long term (current) drug therapy: Secondary | ICD-10-CM | POA: Insufficient documentation

## 2012-06-30 DIAGNOSIS — J069 Acute upper respiratory infection, unspecified: Secondary | ICD-10-CM | POA: Insufficient documentation

## 2012-06-30 MED ORDER — DIPHENHYDRAMINE HCL 25 MG PO CAPS
25.0000 mg | ORAL_CAPSULE | Freq: Once | ORAL | Status: AC
  Administered 2012-06-30: 25 mg via ORAL
  Filled 2012-06-30: qty 1

## 2012-06-30 MED ORDER — AZITHROMYCIN 250 MG PO TABS: ORAL_TABLET | ORAL | Status: DC

## 2012-06-30 MED ORDER — OXYCODONE-ACETAMINOPHEN 5-325 MG PO TABS
2.0000 | ORAL_TABLET | Freq: Once | ORAL | Status: AC
  Administered 2012-06-30: 2 via ORAL
  Filled 2012-06-30: qty 2

## 2012-06-30 MED ORDER — ONDANSETRON 4 MG PO TBDP
4.0000 mg | ORAL_TABLET | Freq: Once | ORAL | Status: AC
  Administered 2012-06-30: 4 mg via ORAL
  Filled 2012-06-30: qty 1

## 2012-06-30 MED ORDER — DEXAMETHASONE 6 MG PO TABS
10.0000 mg | ORAL_TABLET | Freq: Once | ORAL | Status: AC
  Administered 2012-06-30: 10 mg via ORAL
  Filled 2012-06-30: qty 1

## 2012-06-30 MED ORDER — KETOROLAC TROMETHAMINE 60 MG/2ML IM SOLN
60.0000 mg | Freq: Once | INTRAMUSCULAR | Status: AC
  Administered 2012-06-30: 60 mg via INTRAMUSCULAR
  Filled 2012-06-30: qty 2

## 2012-06-30 MED ORDER — ALBUTEROL SULFATE HFA 108 (90 BASE) MCG/ACT IN AERS: 2.0000 | INHALATION_SPRAY | Freq: Four times a day (QID) | RESPIRATORY_TRACT | Status: DC | PRN

## 2012-06-30 NOTE — ED Provider Notes (Signed)
Medical screening examination/treatment/procedure(s) were performed by non-physician practitioner and as supervising physician I was immediately available for consultation/collaboration.   Magda Muise, MD 06/30/12 1547 

## 2012-06-30 NOTE — ED Notes (Signed)
Pt states he has had sick contacts with daughter and son who have had bronchitis and strep, respectively. Pt states he has n/v/d along with general malaise and a headache. Pt also reports dry cough with chest wall pain.

## 2012-06-30 NOTE — ED Provider Notes (Signed)
History     CSN: [id number]  Arrival date & time 06/30/12  1036   First MD Initiated Contact with Patient 06/30/12 1056      Chief Complaint  Patient presents with  . Headache  . URI    (Consider location/radiation/quality/duration/timing/severity/associated sxs/prior treatment) HPI Comments: Patient with history  presents with multiple complaints. Patient states that he has had sick contacts who were diagnoses with strep and bronchitis. He complaints of productive cough with rusty sputum since weds, sore throat, and headache. He states that the headache is 10/10, left sided, with associated photophobia and nausea. This headache is like his other migraines. Denies fever or chills. Denies V,D or abdominal pain. Denies SOB or wheezing. Denies that this is the worst headache of his life.  The history is provided by the patient. No language interpreter was used.    Past Medical History  Diagnosis Date  . Migraines   . Depression     Past Surgical History  Procedure Date  . Knee surgery   . Back surgery     Family History  Problem Relation Age of Onset  . Diabetes Father     History  Substance Use Topics  . Smoking status: Current Every Day Smoker    Types: Cigarettes  . Smokeless tobacco: Never Used  . Alcohol Use: Yes     rare      Review of Systems  Constitutional: Negative for fever and chills.  Eyes: Positive for photophobia.  Respiratory: Positive for cough.   Gastrointestinal: Negative for nausea, vomiting, abdominal pain and diarrhea.  Neurological: Positive for headaches.    Allergies  Review of patient's allergies indicates no known allergies.  Home Medications   Current Outpatient Rx  Name Route Sig Dispense Refill  . ACETAMINOPHEN 500 MG PO TABS Oral Take 1,000-2,000 mg by mouth every 6 (six) hours as needed. For migraine    . DIPHENHYDRAMINE HCL 25 MG PO TABS Oral Take 25-50 mg by mouth every 6 (six) hours as needed. For sleep/allergies.    Marland Kitchen  GUAIFENESIN ER 600 MG PO TB12 Oral Take 1,200 mg by mouth 2 (two) times daily.    Marland Kitchen HYDROCODONE-ACETAMINOPHEN 5-325 MG PO TABS Oral Take 1 tablet by mouth every 6 (six) hours as needed for pain. 7 tablet 0  . IBUPROFEN 200 MG PO TABS Oral Take 600 mg by mouth every 8 (eight) hours as needed. For pain.    Marland Kitchen PREDNISONE 20 MG PO TABS  3 Tabs PO Days 1-3, then 2 tabs PO Days 4-6, then 1 tab PO Day 7-9, then Half Tab PO Day 10-12 20 tablet 0    BP 141/83  Pulse 88  Temp 98.7 F (37.1 C) (Oral)  Resp 12  SpO2 99%  Physical Exam  Nursing note and vitals reviewed. Constitutional: He is oriented to person, place, and time. He appears well-developed and well-nourished. No distress.  HENT:  Head: Normocephalic and atraumatic.  Mouth/Throat: Oropharynx is clear and moist. No oropharyngeal exudate.  Eyes: Conjunctivae normal and EOM are normal. Pupils are equal, round, and reactive to light. No scleral icterus.  Neck: Normal range of motion. Neck supple.  Cardiovascular: Normal rate, regular rhythm and normal heart sounds.   Pulmonary/Chest: Effort normal and breath sounds normal.  Abdominal: Soft. Bowel sounds are normal. There is no tenderness.  Lymphadenopathy:    He has no cervical adenopathy.  Neurological: He is alert and oriented to person, place, and time. No cranial nerve deficit. He exhibits normal  muscle tone. Coordination normal.       Cranial nerves II-XII grossly intact. Good finger to nose. No pronator drift and negative Romberg.  Skin: Skin is warm.    ED Course  Procedures (including critical care time)  Labs Reviewed - No data to display No results found.   1. URI (upper respiratory infection)   2. Migraine       MDM  Patient presented with multiple complaint. Presentation is consistent with mirgraine and bronchitis. Patient concerned about strep but does not meet [facility] criteria. Patient given migraine cocktail with improvement. Counseled on smoking cessation.  Discharged with inhaler and Rx for Z-pak. Return precautions given.        Pixie Casino, PA-C 06/30/12 1215

## 2012-07-11 ENCOUNTER — Emergency Department (HOSPITAL_COMMUNITY)
Admission: EM | Admit: 2012-07-11 | Discharge: 2012-07-11 | Disposition: A | Discharge status: Home / Self Care | Payer: Self-pay | Attending: Emergency Medicine | Admitting: Emergency Medicine

## 2012-07-11 ENCOUNTER — Encounter (HOSPITAL_COMMUNITY): Payer: Self-pay | Admitting: Family Medicine

## 2012-07-11 ENCOUNTER — Emergency Department (HOSPITAL_COMMUNITY): Payer: Self-pay

## 2012-07-11 DIAGNOSIS — Z79899 Other long term (current) drug therapy: Secondary | ICD-10-CM | POA: Insufficient documentation

## 2012-07-11 DIAGNOSIS — F329 Major depressive disorder, single episode, unspecified: Secondary | ICD-10-CM | POA: Insufficient documentation

## 2012-07-11 DIAGNOSIS — G43909 Migraine, unspecified, not intractable, without status migrainosus: Secondary | ICD-10-CM | POA: Insufficient documentation

## 2012-07-11 DIAGNOSIS — M25569 Pain in unspecified knee: Secondary | ICD-10-CM | POA: Insufficient documentation

## 2012-07-11 DIAGNOSIS — G8929 Other chronic pain: Secondary | ICD-10-CM | POA: Insufficient documentation

## 2012-07-11 DIAGNOSIS — F3289 Other specified depressive episodes: Secondary | ICD-10-CM | POA: Insufficient documentation

## 2012-07-11 DIAGNOSIS — F17200 Nicotine dependence, unspecified, uncomplicated: Secondary | ICD-10-CM | POA: Insufficient documentation

## 2012-07-11 MED ORDER — OXYCODONE-ACETAMINOPHEN 5-325 MG PO TABS
2.0000 | ORAL_TABLET | Freq: Once | ORAL | Status: AC
  Administered 2012-07-11: 2 via ORAL
  Filled 2012-07-11: qty 2

## 2012-07-11 NOTE — ED Provider Notes (Signed)
History     CSN: [id number]  Arrival date & time 07/11/12  0112   First MD Initiated Contact with Patient 07/11/12 0132      Chief Complaint  Patient presents with  . Back Pain  . Knee Pain   HPI  History provided by the patient. Patient is a 36 year old male with history of chronic back pain and prior back surgery who presents with complaints of right knee pain and low back pains after a fall. Patient states he has also had chronic right knee pain for quite some time. Patient was in the kitchen last Sunday 4 days ago and reports that right knee suddenly gave out causing him to fall. Patient fully flexed at the knee and fell onto his bottom and back. Since that time he has had increasing knee pain as well as low back pains. Patient has tried to rest the leg and back with slight improvements at times. Pain is worse with walking and movements. Patient feels pain was worse after working his shift today. He reports some associated swelling in the leg and knee. He denies any numbness or weakness. Denies any urinary or fecal incontinence, perineal numbness or urinary retention.    Past Medical History  Diagnosis Date  . Migraines   . Depression     Past Surgical History  Procedure Date  . Knee surgery   . Back surgery     Family History  Problem Relation Age of Onset  . Diabetes Father     History  Substance Use Topics  . Smoking status: Current Every Day Smoker    Types: Cigarettes  . Smokeless tobacco: Never Used  . Alcohol Use: Yes     Comment: rare      Review of Systems  Constitutional: Negative for fever, chills and unexpected weight change.  Musculoskeletal: Positive for back pain.       Rt knee pain.    Allergies  Review of patient's allergies indicates no known allergies.  Home Medications   Current Outpatient Rx  Name  Route  Sig  Dispense  Refill  . ACETAMINOPHEN 500 MG PO TABS   Oral   Take 1,000-2,000 mg by mouth every 6 (six) hours as needed. For  migraine         . ALBUTEROL SULFATE HFA 108 (90 BASE) MCG/ACT IN AERS   Inhalation   Inhale 2 puffs into the lungs every 6 (six) hours as needed for wheezing.   1 Inhaler   1   . AZITHROMYCIN 250 MG PO TABS      2 po day one, then 1 daily x 4 days   5 tablet   0   . DIPHENHYDRAMINE HCL 25 MG PO TABS   Oral   Take 25-50 mg by mouth every 6 (six) hours as needed. For sleep/allergies.         Marland Kitchen GUAIFENESIN ER 600 MG PO TB12   Oral   Take 1,200 mg by mouth 2 (two) times daily.         Marland Kitchen HYDROCODONE-ACETAMINOPHEN 5-325 MG PO TABS   Oral   Take 1 tablet by mouth every 6 (six) hours as needed for pain.   7 tablet   0   . IBUPROFEN 200 MG PO TABS   Oral   Take 600 mg by mouth every 8 (eight) hours as needed. For pain.         Marland Kitchen PREDNISONE 20 MG PO TABS      3  Tabs PO Days 1-3, then 2 tabs PO Days 4-6, then 1 tab PO Day 7-9, then Half Tab PO Day 10-12   20 tablet   0     BP 137/78  Pulse 89  Temp 98.1 F (36.7 C) (Oral)  Resp 20  Ht 6' (1.829 m)  Wt 225 lb (102.059 kg)  BMI 30.52 kg/m2  SpO2 97%  Physical Exam  Nursing note and vitals reviewed. Constitutional: He is oriented to person, place, and time. He appears well-developed and well-nourished. No distress.  HENT:  Head: Normocephalic.  Cardiovascular: Normal rate and regular rhythm.   Pulmonary/Chest: Effort normal and breath sounds normal.  Abdominal: Soft.  Musculoskeletal:       Thoracic back: Normal.       Lumbar back: Normal.       Old well-healed surgical scars over right knee consistent with history of arthroscopic procedure. There is mild swelling anteriorly with some tenderness to palpation. Reduced range of motion secondary to pain. No increased laxity with balance or stress. Negative anterior posterior drawer test. Normal distal pulses and sensation in foot. No swelling or pain along lower leg and calf.  Skin normal.   Neurological: He is alert and oriented to person, place, and time.    Skin: Skin is warm. No erythema.  Psychiatric: He has a normal mood and affect. His behavior is normal.    ED Course  Procedures   Dg Knee Complete 4 Views Right  07/11/2012  *RADIOLOGY REPORT*  Clinical Data: Status post fall, with worsening right knee pain.  RIGHT KNEE - COMPLETE 4+ VIEW  Comparison: Right knee radiographs performed 04/12/2012, and MRI of the right knee performed 04/25/2012  Findings: There is no evidence of fracture or dislocation.  The joint spaces are preserved.  There is cortical irregularity along the lateral femoral condyle, with associated sclerosis.  The patellofemoral compartment demonstrates slight cortical irregularity but is otherwise within normal limits.  Trace knee joint fluid remains within normal limits.  The visualized soft tissues are normal in appearance.  IMPRESSION:  1.  No evidence of fracture or dislocation. 2.  Lateral compartment osteoarthritis again noted.   Original Report Authenticated By: Tonia Ghent, M.D.      1. Knee pain   2. Chronic pain       MDM  1:30AM Pt seen and evaluated. Patient appears uncomfortable but        Angus Seller, [place] 07/11/12 [phone]

## 2012-07-11 NOTE — ED Notes (Signed)
Patient states his right knee gave out on him on Sunday causing him to fall. States his leg was under him after the fall. Had L5 surgery in 07/2006. Also reports pain in lower back.

## 2012-07-11 NOTE — ED Provider Notes (Signed)
Medical screening examination/treatment/procedure(s) were performed by non-physician practitioner and as supervising physician I was immediately available for consultation/collaboration.    Vida Roller, MD 07/11/12 [phone]

## 2012-08-25 ENCOUNTER — Emergency Department (HOSPITAL_COMMUNITY)
Admission: EM | Admit: 2012-08-25 | Discharge: 2012-08-25 | Disposition: A | Discharge status: Home / Self Care | Payer: Self-pay | Attending: Emergency Medicine | Admitting: Emergency Medicine

## 2012-08-25 ENCOUNTER — Encounter (HOSPITAL_COMMUNITY): Payer: Self-pay

## 2012-08-25 DIAGNOSIS — R110 Nausea: Secondary | ICD-10-CM | POA: Insufficient documentation

## 2012-08-25 DIAGNOSIS — Z8679 Personal history of other diseases of the circulatory system: Secondary | ICD-10-CM | POA: Insufficient documentation

## 2012-08-25 DIAGNOSIS — R51 Headache: Secondary | ICD-10-CM | POA: Insufficient documentation

## 2012-08-25 DIAGNOSIS — H538 Other visual disturbances: Secondary | ICD-10-CM | POA: Insufficient documentation

## 2012-08-25 DIAGNOSIS — H53149 Visual discomfort, unspecified: Secondary | ICD-10-CM | POA: Insufficient documentation

## 2012-08-25 DIAGNOSIS — F0781 Postconcussional syndrome: Secondary | ICD-10-CM | POA: Insufficient documentation

## 2012-08-25 DIAGNOSIS — F17200 Nicotine dependence, unspecified, uncomplicated: Secondary | ICD-10-CM | POA: Insufficient documentation

## 2012-08-25 DIAGNOSIS — Z8659 Personal history of other mental and behavioral disorders: Secondary | ICD-10-CM | POA: Insufficient documentation

## 2012-08-25 DIAGNOSIS — R42 Dizziness and giddiness: Secondary | ICD-10-CM | POA: Insufficient documentation

## 2012-08-25 DIAGNOSIS — J40 Bronchitis, not specified as acute or chronic: Secondary | ICD-10-CM | POA: Insufficient documentation

## 2012-08-25 MED ORDER — ONDANSETRON 8 MG PO TBDP
8.0000 mg | ORAL_TABLET | Freq: Once | ORAL | Status: AC
  Administered 2012-08-25: 8 mg via ORAL
  Filled 2012-08-25: qty 1

## 2012-08-25 MED ORDER — HYDROCODONE-HOMATROPINE 5-1.5 MG/5ML PO SYRP: 5.0000 mL | ORAL_SOLUTION | Freq: Four times a day (QID) | ORAL | Status: DC | PRN

## 2012-08-25 MED ORDER — OXYCODONE-ACETAMINOPHEN 5-325 MG PO TABS
1.0000 | ORAL_TABLET | Freq: Once | ORAL | Status: AC
  Administered 2012-08-25: 1 via ORAL
  Filled 2012-08-25: qty 1

## 2012-08-25 NOTE — ED Provider Notes (Signed)
History     CSN: [id number]  Arrival date & time 08/25/12  1150   First MD Initiated Contact with Patient 08/25/12 1247      Chief Complaint  Patient presents with  . Cough  . Headache    (Consider location/radiation/quality/duration/timing/severity/associated sxs/prior treatment) HPI Comments: Micheal Roth is a 36 y.o. male with a history of vasovagal syncope (07/23/2012), migraines and chronic bronchitis presents emergency department with a lingering cough and post concussive syndrome symptoms including headache, nausea, photophobia, blurred vision and dizziness.  Patient is a current everyday smoker who states he cut back from a pack a day to half a pack a day.  His cough is productive but there's been no recent change in his sputum.  Patient denies any fevers, night sweats, chills, hemoptysis, chest palpitations, syncope.  No other complaints at this time.  The history is provided by the patient.    Past Medical History  Diagnosis Date  . Migraines   . Depression     Past Surgical History  Procedure Date  . Knee surgery   . Back surgery     Family History  Problem Relation Age of Onset  . Diabetes Father     History  Substance Use Topics  . Smoking status: Current Every Day Smoker    Types: Cigarettes  . Smokeless tobacco: Never Used  . Alcohol Use: Yes     Comment: rare      Review of Systems  Constitutional: Negative for fever, chills and appetite change.  HENT: Negative for congestion.   Eyes: Negative for visual disturbance.  Respiratory: Positive for cough. Negative for chest tightness, shortness of breath, wheezing and stridor.   Cardiovascular: Negative for chest pain and leg swelling.  Gastrointestinal: Positive for nausea. Negative for vomiting, abdominal pain and constipation.  Genitourinary: Negative for dysuria, urgency and frequency.  Neurological: Positive for dizziness and headaches. Negative for syncope, weakness, light-headedness  and numbness.  Psychiatric/Behavioral: Negative for confusion.  All other systems reviewed and are negative.    Allergies  Review of patient's allergies indicates no known allergies.  Home Medications   Current Outpatient Rx  Name  Route  Sig  Dispense  Refill  . ACETAMINOPHEN 500 MG PO TABS   Oral   Take 2,000 mg by mouth every 4 (four) hours as needed. For migraine         . ALBUTEROL SULFATE HFA 108 (90 BASE) MCG/ACT IN AERS   Inhalation   Inhale 2 puffs into the lungs every 6 (six) hours as needed for wheezing.   1 Inhaler   1   . DIPHENHYDRAMINE HCL 25 MG PO TABS   Oral   Take 25-50 mg by mouth every 6 (six) hours as needed. For sleep/allergies.         Marland Kitchen GUAIFENESIN ER 600 MG PO TB12   Oral   Take 1,200 mg by mouth 2 (two) times daily.         . IBUPROFEN 200 MG PO TABS   Oral   Take 800 mg by mouth every 8 (eight) hours as needed. For pain.           BP 156/88  Pulse 68  Temp 98.6 F (37 C) (Oral)  Resp 16  SpO2 98%  Physical Exam  Nursing note and vitals reviewed. Constitutional: He is oriented to person, place, and time. He appears well-developed and well-nourished. No distress.  HENT:  Head: Normocephalic and atraumatic.  Eyes: Conjunctivae normal and EOM  are normal. Pupils are equal, round, and reactive to light. No scleral icterus.  Neck: Normal range of motion.       Neck supple with no nuchal rigidity, full pain free ROM. No carotid bruit  Cardiovascular: Normal rate, regular rhythm, normal heart sounds and intact distal pulses.   Pulmonary/Chest: Effort normal and breath sounds normal. No respiratory distress.       LCAB  Musculoskeletal: Normal range of motion.  Neurological: He is alert and oriented to person, place, and time. He has normal strength.       CN III-XII intact, good coordination, normal gait, strength 5/5 bilaterally, intact distal sensation.  Skin: Skin is warm and dry.       No rash, non diaphoretic    ED Course   Procedures (including critical care time)  Labs Reviewed - No data to display No results found.   No diagnosis found.    MDM  Post concussive syn & bronchitis  Pt presentation non concerning for SAH, ICH, Meningitis, or temporal arteritis. Pt is afebrile with no focal neuro deficits, nuchal rigidity, or change in vision. Pt is to follow up with [facility] to discuss migraine treatment. Pt verbalizes understanding and is agreeable with plan to dc.          Jaci Carrel, [place] 08/25/12 1359

## 2012-08-25 NOTE — ED Notes (Addendum)
Patient reports having a concussion in November and is still c/o headache, nausea, and blurred vision. Patient also states he is having a productive cough with light brown sputum and body aches. Patient denies vomiting.

## 2012-08-25 NOTE — ED Provider Notes (Signed)
Medical screening examination/treatment/procedure(s) were conducted as a shared visit with non-physician practitioner(s) and myself.  I personally evaluated the patient during the encounter  Doug Sou, MD 08/25/12 [phone]

## 2012-08-25 NOTE — ED Provider Notes (Signed)
Complains of cough for several weeks also complains of headaches for several weeks since falling and striking his head. Patient has had CT scan which was normal. On exam patient is in no distress alert talkative Glasgow Coma Score 15 HEENT exam no facial asymmetry lungs clear auscultation and neurologic Glasgow Coma Score 15 cranial nerves II through XII grossly intact motor strength 5 over 5 overall gait normal. Patient advised to stop smoking Diagnosis postconcussive syndrome #2 bronchitis 3 tobacco abuse  Doug Sou, MD 08/25/12 1348

## 2012-11-30 ENCOUNTER — Telehealth: Payer: Self-pay | Admitting: *Deleted

## 2012-11-30 NOTE — Telephone Encounter (Signed)
Called patient no answer, left a message stating that his appointment that was scheduled for 12-17-12 has been canceled. Will call patient next week to reschedule

## 2012-12-13 NOTE — Telephone Encounter (Signed)
Patient called back and is now rescheduled for 12-18-12 @ 2pm

## 2012-12-17 ENCOUNTER — Ambulatory Visit: Payer: Self-pay | Admitting: Nurse Practitioner

## 2012-12-18 ENCOUNTER — Encounter: Payer: Self-pay | Admitting: Nurse Practitioner

## 2012-12-18 ENCOUNTER — Ambulatory Visit (INDEPENDENT_AMBULATORY_CARE_PROVIDER_SITE_OTHER): Payer: Self-pay | Admitting: Nurse Practitioner

## 2012-12-18 VITALS — BP 155/84 | HR 94 | Ht 72.0 in | Wt 227.0 lb

## 2012-12-18 DIAGNOSIS — F05 Delirium due to known physiological condition: Secondary | ICD-10-CM | POA: Insufficient documentation

## 2012-12-18 DIAGNOSIS — G43909 Migraine, unspecified, not intractable, without status migrainosus: Secondary | ICD-10-CM

## 2012-12-18 MED ORDER — TOPIRAMATE 25 MG PO TABS: ORAL_TABLET | ORAL | Status: DC

## 2012-12-18 NOTE — Progress Notes (Signed)
I have read the note, and I agree with the clinical assessment and plan.  

## 2012-12-18 NOTE — Progress Notes (Signed)
HPI: Patient returns for followup after initial visit with Dr. Anne Hahn 09/18/2012 for headache. Patient also has a history of multiple concussions throughout his life. He has had chronic daily headaches for at least 13 years after another concussion occurred he apparently fell and hit his head on a sink and then blacked out this occurred in November 2013. He has had an increase in the severity of headaches since that time. He also has episodes of confusion which last 10-20 minutes, his gait can be staggering, he does not respond to questions. One of these was witnessed by his mother who is with him today. He is currently not driving.  Patient also has a history of chronic low back pain and mild weakness and numbness of the left leg but otherwise no focal symptoms. He has not had loss of control of the bowel or bladder, he has not had any falls. Headaches mainly across the left side associated with some neck stiffness. He can also have dizziness, blurring of vision and spots in front of his eyes. He has not vomited but can have nausea and he is currently not working because he cannot drive due to episodes of confusion. EEG 09/23/2012 was read as normal.  ROS:  - hearing loss, ringing in ears, spinning sensation, blurred vision, snoring, joint pain, joint swelling, cramps, aching muscles, memory loss, confusion, headache, weakness, slurred speech, dizziness, depression, anxiety, not enough sleep  Physical Exam General: well developed, obese male  seated, in no evident distress Head: head normocephalic and atraumatic. Oropharynx benign Neck:  supple with no carotid or supraclavicular bruits Cardiovascular: regular rate and rhythm, no murmurs Musculoskeletal  Lacks 15 of lateral rotation of the cervical spine to the left, 10 to the right  Neurologic Exam Mental Status: Awake and fully alert. . Attention span, concentration and fund of knowledge appropriate.  Cranial Nerves: Fundoscopic exam reveals flat  disc margins. Pupils equal, briskly reactive to light. Extraocular movements full without nystagmus. Visual fields full to confrontation. Hearing intact and symmetric to finger snap. Facial sensation intact. Face, tongue, palate move normally and symmetrically. Motor: Normal bulk and tone. Normal strength in all tested extremity muscles. Sensory.: intact to touch and pinprick and vibratory.  Coordination: Rapid alternating movements normal in all extremities. Finger-to-nose and heel-to-shin performed accurately bilaterally. Gait and Station: Arises from chair without difficulty. Stance is with a limping type gait on the left leg.  Able to heel, toe and  slightly unsteady with tandem walk. eflexes: 2+ and symmetric. Toes downgoing.     ASSESSMENT: Chronic daily headache, amitriptyline stopped by Dr. Anne Hahn for episodes of confusion. Depakote was ordered but pt could not afford.  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

## 2012-12-18 NOTE — Patient Instructions (Addendum)
Began Topamax 25 mg at bedtime for one week and titrate as directed His medication should help seizure as well as headaches No driving for 6 months from his previous episode of confusion Followup in 3 months

## 2013-03-04 ENCOUNTER — Emergency Department (HOSPITAL_COMMUNITY): Payer: Self-pay

## 2013-03-04 ENCOUNTER — Emergency Department (HOSPITAL_COMMUNITY)
Admission: EM | Admit: 2013-03-04 | Discharge: 2013-03-04 | Disposition: A | Discharge status: Home / Self Care | Payer: Self-pay | Attending: Emergency Medicine | Admitting: Emergency Medicine

## 2013-03-04 ENCOUNTER — Encounter (HOSPITAL_COMMUNITY): Payer: Self-pay | Admitting: Emergency Medicine

## 2013-03-04 DIAGNOSIS — Z8679 Personal history of other diseases of the circulatory system: Secondary | ICD-10-CM | POA: Insufficient documentation

## 2013-03-04 DIAGNOSIS — R569 Unspecified convulsions: Secondary | ICD-10-CM | POA: Insufficient documentation

## 2013-03-04 DIAGNOSIS — M542 Cervicalgia: Secondary | ICD-10-CM

## 2013-03-04 DIAGNOSIS — M25519 Pain in unspecified shoulder: Secondary | ICD-10-CM | POA: Insufficient documentation

## 2013-03-04 DIAGNOSIS — Z79899 Other long term (current) drug therapy: Secondary | ICD-10-CM | POA: Insufficient documentation

## 2013-03-04 DIAGNOSIS — R209 Unspecified disturbances of skin sensation: Secondary | ICD-10-CM | POA: Insufficient documentation

## 2013-03-04 DIAGNOSIS — M79609 Pain in unspecified limb: Secondary | ICD-10-CM | POA: Insufficient documentation

## 2013-03-04 DIAGNOSIS — F17200 Nicotine dependence, unspecified, uncomplicated: Secondary | ICD-10-CM | POA: Insufficient documentation

## 2013-03-04 DIAGNOSIS — M25512 Pain in left shoulder: Secondary | ICD-10-CM

## 2013-03-04 DIAGNOSIS — Z8659 Personal history of other mental and behavioral disorders: Secondary | ICD-10-CM | POA: Insufficient documentation

## 2013-03-04 HISTORY — DX: Unspecified convulsions: R56.9

## 2013-03-04 MED ORDER — OXYCODONE-ACETAMINOPHEN 5-325 MG PO TABS
2.0000 | ORAL_TABLET | Freq: Once | ORAL | Status: AC
  Administered 2013-03-04: 2 via ORAL
  Filled 2013-03-04: qty 2

## 2013-03-04 MED ORDER — DIAZEPAM 5 MG PO TABS: ORAL_TABLET | ORAL | Status: DC

## 2013-03-04 MED ORDER — OXYCODONE-ACETAMINOPHEN 5-325 MG PO TABS: ORAL_TABLET | ORAL | Status: DC

## 2013-03-04 NOTE — ED Notes (Signed)
Pt states that he has injured himself 1 in nov and has had intmit pain to neck and lt arm not able to lift his lt arm for 2 days now. Alert x4, no slured speech,

## 2013-03-04 NOTE — ED Provider Notes (Signed)
History    CSN: [id number] Arrival date & time 03/04/13  0944  First MD Initiated Contact with Patient 03/04/13 [phone]     Chief Complaint  Patient presents with  . Neck Pain  . Numbness  . Arm Pain   (Consider location/radiation/quality/duration/timing/severity/associated sxs/prior Treatment) HPI Comments: 37 y.o. Male with PMHx of cervical spine pain, back pain, and migraines seen by [facility] (last visit 12/18/12) presents today complaining of left shoulder numbness, pain, and decreased range of motion secondary to pain. This is a new problem for the pt. Onset was yesterday. Gradually worsening. Described as sharp pain does radiate from the cervical spine, to the left shoulder, down the lateral portion of his left arm, top of his hand, and pinky and thumb more than the middle fingers. Pt denies trauma, new injury. Pt has tried ibuprofen, tylenol, heat, ice, and rest with no relief. Pt states pain was a 7/10 yesterday, 10/10 yesterday. Worse with movement.   Reference to Nov 1 injury in nurses note: pt was seen Nov 7 after falling in his kitchen, complaining of back and knee pain. No mention of shoulder pain at that time.   Review of records show pt is seen by [facility] for migraines and cervical spine issues secondary to multiple concussions. Last exam on 12/18/12 notes baseline of 15 of lateral rotation of the cervical spine to the left, 10 to the right. Last EEG on 09/23/12 was normal.    Patient is a 37 y.o. male presenting with neck pain and arm pain.  Neck Pain Associated symptoms: numbness   Associated symptoms: no chest pain, no fever, no headaches and no weakness   Arm Pain Associated symptoms include neck pain and numbness. Pertinent negatives include no chest pain, diaphoresis, fever, headaches, nausea, rash, vomiting or weakness.   Past Medical History  Diagnosis Date  . Migraines   . Depression   . Seizures    Past Surgical History  Procedure  Laterality Date  . Knee surgery    . Back surgery     Family History  Problem Relation Age of Onset  . Diabetes Father    History  Substance Use Topics  . Smoking status: Current Every Day Smoker    Types: Cigarettes  . Smokeless tobacco: Never Used  . Alcohol Use: Yes     Comment: rarely    Review of Systems  Constitutional: Negative for fever and diaphoresis.  HENT: Positive for neck pain. Negative for neck stiffness.        Cervical spine, left sided  Eyes: Negative for visual disturbance.  Respiratory: Negative for apnea, chest tightness and shortness of breath.   Cardiovascular: Negative for chest pain and palpitations.  Gastrointestinal: Negative for nausea, vomiting, diarrhea and constipation.  Genitourinary: Negative for dysuria.  Musculoskeletal: Negative for gait problem.  Skin: Negative for rash.  Neurological: Positive for numbness. Negative for dizziness, speech difficulty, weakness, light-headedness and headaches.       Numbness to left shoulder radiating down lateral arm to dorsal aspect of left hand, more pinky and thumb than other fingers    Allergies  Review of patient's allergies indicates no known allergies.  Home Medications   Current Outpatient Rx  Name  Route  Sig  Dispense  Refill  . acetaminophen (TYLENOL) 500 MG tablet   Oral   Take 2,000 mg by mouth every 4 (four) hours as needed. For migraine         . diphenhydrAMINE (BENADRYL) 25 MG tablet  Oral   Take 25-50 mg by mouth every 6 (six) hours as needed. For sleep/allergies.         Marland Kitchen ibuprofen (ADVIL,MOTRIN) 200 MG tablet   Oral   Take 800 mg by mouth every 4 (four) hours as needed for headache. For pain.         Marland Kitchen topiramate (TOPAMAX) 25 MG tablet      1 tab hs for 1 week then increase by 25mg  every week until dose is 100mg .   120 tablet   3    There were no vitals taken for this visit. Physical Exam  Nursing note and vitals reviewed. Constitutional: He is oriented to  person, place, and time. He appears well-developed and well-nourished. No distress.  HENT:  Head: Normocephalic and atraumatic.  Eyes: Conjunctivae and EOM are normal.  Neck: Normal range of motion. Neck supple.  No meningeal signs  Cardiovascular: Normal rate, regular rhythm and normal heart sounds.  Exam reveals no gallop and no friction rub.   No murmur heard. Pulmonary/Chest: Effort normal and breath sounds normal. No respiratory distress. He has no wheezes. He has no rales. He exhibits no tenderness.  Abdominal: Soft. Bowel sounds are normal. He exhibits no distension. There is no tenderness. There is no rebound and no guarding.  Musculoskeletal: Normal range of motion. He exhibits no edema and no tenderness.  FROM to upper and lower extremities Left shoulder: tenderness to palpation superiorly and anteriorly, no crepitus, no joint laxity, no deformity, no swelling, no erythema, no warmth, no effusion, no deformity, no bony tenderness No step-offs noted on C-spine No tenderness to palpation of the spinous processes of the C-spine, T-spine or L-spine Baseline range of motion of C-spine, T-spine, L-spine Mild tenderness to palpation of the cervical paraspinous muscles radiating to left trapezius   Neurological: He is alert and oriented to person, place, and time. No cranial nerve deficit.  Speech is clear and goal oriented, follows commands Sensation mildly decreased to light touch and two point discrimination on left lateral forearm and dorsal aspect proximal phalanx of left thumb, middle finger, pinky finger Moves extremities without ataxia, coordination intact Normal gait and balance Decreased strength in left upper extremities secondary to pain. Strong dorsiflexion and plantar flexion   Skin: Skin is warm and dry. He is not diaphoretic. No erythema.  Psychiatric:  anxious    ED Course  Procedures (including critical care time)  Medications  oxyCODONE-acetaminophen  (PERCOCET/ROXICET) 5-325 MG per tablet 2 tablet (2 tablets Oral Given 03/04/13 1029)    Labs Reviewed - No data to display Dg Cervical Spine Complete  03/04/2013   *RADIOLOGY REPORT*  Clinical Data: Neck/left arm pain  CERVICAL SPINE - COMPLETE 4+ VIEW  Comparison: None.  Findings: Cervical spine is visualized to C7-T1 on the lateral view.  Mild straightening of the cervical spine.  No evidence of fracture or dislocation.  The vertebral body heights are maintained.  The dens appears intact.  Lateral masses of C1 are symmetric.  No prevertebral soft tissue swelling.  Mild degenerative changes of the lower cervical spine.  Although evaluation is constrained by obliquity, there is suspected mild to moderate narrowing of the right C5-6 neural foramen.  Visualized lung apices are clear.  IMPRESSION: No fracture or dislocation is seen.  Mild degenerative changes of the lower cervical spine.  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   Original Report Authenticated By: Charline Bills, M.D.   1. Left anterior shoulder pain  2. Cervicalgia    New Prescriptions   DIAZEPAM (VALIUM) 5 MG TABLET    Take one tablet by mouth at night as needed for muscle relaxer.   OXYCODONE-ACETAMINOPHEN (PERCOCET/ROXICET) 5-325 MG PER TABLET    Take one or two tablets by mouth as needed for pain every 4 to 6 hours.    MDM  Pt looks comfortable sitting in bed, though he is stating 10/10 pain. Was texting on phone as I entered the room. PE shows no instability, tenderness, or deformity of acromioclavicular and sternoclavicular joints, the cervical spine, glenohumeral joint, coracoid process, acromion, or scapula. Full passive ROM though painful. Considering HPI and PE, including lack of trauma, hx of chronic conditions, no swelling, no erythema, no warmth, no effusion, no deformity, no bony tenderness, no fever, not concerned for acute bony injury or septic arthritis  11:02 AM On re-evaluation, pt is  more comfortable after percocet.   11:17 AM I personally reviewed the xray. Some narrowing is noted on imaging to C5-C6 along with mild degenerative changes of the lower cervical spine. This is consistent with the numbness that the pt described. Pt states he has an appointment in a few months with his neurologist, but recommended that the call the neurologist today to discuss today's visit. Will prescribed some pain medicine and muscle relaxer for home. Pt case discussed with Dr. Lynelle Doctor who agrees with plan. Discussed reasons to seek immediate care. Patient expresses understanding and agrees with plan.   Glade Nurse, PA-C 03/04/13 1140

## 2013-03-04 NOTE — ED Notes (Signed)
Pt states he has had L shoulder and neck pain.  Pt denies recent injury to arm, but states he has a hx of L arm injuries.  Pt states it hurts when he raises his arm.

## 2013-03-04 NOTE — Progress Notes (Signed)
[facility] has seen patient and provided him with a oc application. °

## 2013-03-04 NOTE — ED Provider Notes (Signed)
Medical screening examination/treatment/procedure(s) were performed by non-physician practitioner and as supervising physician I was immediately available for consultation/collaboration.   Celene Kras, MD 03/04/13 1146

## 2013-03-11 ENCOUNTER — Telehealth: Payer: Self-pay | Admitting: Neurology

## 2013-03-11 NOTE — Telephone Encounter (Signed)
I spoke to patient who told me he was having pain in his neck going to his shoulder.  It was so bad that he went to the ER and testing there showed he had a pinched nerve. They told him to contact his neurologist.  I told him we have only seen him for headaches, and I'm not sure if we will need a new referral, so I will check with NP to get advice.

## 2013-03-12 NOTE — Telephone Encounter (Signed)
Pt already has an appt next Monday

## 2013-03-12 NOTE — Telephone Encounter (Signed)
I spoke to patient and he new he had the appointment Monday, he will discuss issues with NP then.

## 2013-03-17 ENCOUNTER — Encounter: Payer: Self-pay | Admitting: Nurse Practitioner

## 2013-03-17 ENCOUNTER — Ambulatory Visit (INDEPENDENT_AMBULATORY_CARE_PROVIDER_SITE_OTHER): Payer: Self-pay | Admitting: Nurse Practitioner

## 2013-03-17 VITALS — BP 125/74 | HR 64 | Ht 71.5 in | Wt 236.0 lb

## 2013-03-17 DIAGNOSIS — M25512 Pain in left shoulder: Secondary | ICD-10-CM

## 2013-03-17 DIAGNOSIS — G43909 Migraine, unspecified, not intractable, without status migrainosus: Secondary | ICD-10-CM

## 2013-03-17 DIAGNOSIS — M25519 Pain in unspecified shoulder: Secondary | ICD-10-CM | POA: Insufficient documentation

## 2013-03-17 DIAGNOSIS — M542 Cervicalgia: Secondary | ICD-10-CM

## 2013-03-17 MED ORDER — CYCLOBENZAPRINE HCL 10 MG PO TABS: 10.0000 mg | ORAL_TABLET | Freq: Three times a day (TID) | ORAL | Status: DC | PRN

## 2013-03-17 MED ORDER — OXYCODONE-ACETAMINOPHEN 5-325 MG PO TABS: 1.0000 | ORAL_TABLET | Freq: Four times a day (QID) | ORAL | Status: DC | PRN

## 2013-03-17 NOTE — Patient Instructions (Addendum)
EMG nerve conduction this week if possible Will call him pain medication and muscle relaxer Pending on results of the EMG nerve conduction will advise further treatment. F/U 3 months

## 2013-03-17 NOTE — Progress Notes (Signed)
I have read the note, and I agree with the clinical assessment and plan.  

## 2013-03-17 NOTE — Progress Notes (Signed)
HPI: Patient returns for followup after last  visit 12/18/12. Has history of headache and questionable seizure activity.  Patient also has a history of multiple concussions throughout his life. He has had chronic daily headaches for at least 13 years after another concussion occurred he apparently fell and hit his head on a sink and then blacked out this occurred in November 2013. He has had an increase in the severity of headaches since that time. He also has episodes of confusion which last 10-20 minutes, his gait can be staggering, he does not respond to questions. One of these was witnessed by his mother. Patient also has a history of chronic low back pain and mild weakness and numbness of the left leg but otherwise no focal symptoms. He has not had loss of control of the bowel or bladder, he has not had any falls. Headaches mainly across the left side associated with some neck stiffness. He can also have dizziness, blurring of vision and spots in front of his eyes. He has not vomited but can have nausea and he is currently not working because he cannot drive due to episodes of confusion. EEG 09/23/2012 was read as normal. Follow up visit today after ER visit on 03/04/2013 for complaints of left shoulder numbness pain and decreased range of motion. This is a new problem for the patient. He felt like the pain starts in the back of his neck and radiates down into the shoulder joint and then down into the arm. The patient denies any trauma to the left shoulder or arm. He denies further seizure activity, and he states his headaches are in good control. He was given Percocet in the ER but has taken those as well as Valium. He returns for evaluation   ROS: Fatigue, hearing loss ringing in the ears, wheezing, snoring , confusion, weakness, numbness to left shoulder radiating down lateral arm to the dorsal aspect of the left hand and also into the fingers. Depression, decreased energy, change in appetite, insomnia.     Physical Exam General: well developed, well nourished, seated, in no evident distress Head: head normocephalic and atraumatic. Oropharynx benign Neck:  [id number] of lateral rotation of the cervical spine to the left 10 to the right . Mild tenderness to cervicals paraspinous muscles.  Cardiovascular: regular rate and rhythm, no murmurs  Neurologic Exam Mental Status: Awake and fully alert. Oriented to place and time. Follows all commands. Speech and language normal.   Cranial Nerves:  Pupils equal, briskly reactive to light. Extraocular movements full without nystagmus. Visual fields full to confrontation. Hearing intact and symmetric to finger snap. Facial sensation intact. Face, tongue, palate move normally and symmetrically. Neck flexion and extension normal.  Motor: Normal bulk and tone. Normal strength in all tested extremity muscles on the right. Decreased range of motion to left shoulder and elbow due to pain . Weak grip strength on the left unable to spread fingers without pain on the left. Sensory.: Mildly decreased to light touch  pinprick and 2 point  discrimination on the left lateral forearm. Decreased vibratory to the elbow on the left. Modalities are normal on the right.  Coordination: Rapid alternating movements normal on the right, decreased on the left due to pain. Gait and Station: Arises from chair without difficulty. Stance is normal. Gait demonstrates normal stride length and balance . Able to heel, toe and tandem walk without difficulty.  Reflexes: 1+ and symmetric. Toes downgoing.     ASSESSMENT: History of chronic daily headache  better controlled with Topamax. History of episodes of confusion which sound like complex partial seizure. None since Topamax was initiated. EEG was normal in January. History of multiple concussions in the past New complaint of numbness to the left shoulder rating radiating down the lateral arm to the dorsal aspect of the left hand with intense  pain. Seen in the ER on 03/04/2013 CT of the cervical spine with no fracture or dislocation, mild degenerative changes of the lower cervical spine, suspect mild to moderate narrowing of the right C5-C6 neural foremen.     PLAN: See in consult Dr. Anne Hahn EMG nerve conduction this week if possible Will order pain medication and muscle relaxer Pending on results of the EMG nerve conduction will advise further treatment. Continue the Topamax 100 mg daily F/U 3 months Nilda Riggs, GNP-BC APRN

## 2013-03-28 ENCOUNTER — Encounter (INDEPENDENT_AMBULATORY_CARE_PROVIDER_SITE_OTHER): Payer: Self-pay

## 2013-03-28 ENCOUNTER — Ambulatory Visit (INDEPENDENT_AMBULATORY_CARE_PROVIDER_SITE_OTHER): Payer: Self-pay | Admitting: Neurology

## 2013-03-28 DIAGNOSIS — M79609 Pain in unspecified limb: Secondary | ICD-10-CM

## 2013-03-28 DIAGNOSIS — M25512 Pain in left shoulder: Secondary | ICD-10-CM

## 2013-03-28 DIAGNOSIS — M542 Cervicalgia: Secondary | ICD-10-CM

## 2013-03-28 DIAGNOSIS — Z0289 Encounter for other administrative examinations: Secondary | ICD-10-CM

## 2013-03-28 MED ORDER — PREDNISONE 5 MG PO TABS: ORAL_TABLET | ORAL | Status: DC

## 2013-03-28 NOTE — Procedures (Signed)
  HISTORY:  Micheal Roth is a 37 year old gentleman with onset of left shoulder and some neck discomfort that began several weeks prior to this evaluation. The patient has pain with movement of of the shoulder, less pain when he is not moving the arm. The patient reports some tingly sensations in the left hand. The patient is being evaluated for a possible neuropathy or a cervical radiculopathy. The patient is unable to elevate the arm more than 30.  NERVE CONDUCTION STUDIES:  Nerve conduction studies were performed on both upper extremities. The distal motor latencies and motor amplitudes for the median and ulnar nerves were within normal limits. The F wave latencies and nerve conduction velocities for these nerves were also normal. The sensory latencies for the median and ulnar nerves were normal.   EMG STUDIES:  EMG study was performed on the left upper extremity:  The first dorsal interosseous muscle reveals 2 to 4 K units with full recruitment. No fibrillations or positive waves were noted. The abductor pollicis brevis muscle reveals 2 to 4 K units with full recruitment. No fibrillations or positive waves were noted. The extensor indicis proprius muscle reveals 1 to 3 K units with full recruitment. No fibrillations or positive waves were noted. The pronator teres muscle reveals 2 to 3 K units with full recruitment. No fibrillations or positive waves were noted. The biceps muscle reveals 1 to 2 K units with full recruitment. No fibrillations or positive waves were noted. The triceps muscle reveals 2 to 3 K units with full recruitment. No fibrillations or positive waves were noted. The anterior deltoid muscle reveals 2 to 3 K units with full recruitment. No fibrillations or positive waves were noted. The cervical paraspinal muscles were tested at 2 levels. No abnormalities of insertional activity were seen at either level tested. There was good relaxation.   IMPRESSION:  Nerve  conduction studies done on both upper extremities were within normal limits. No evidence of a neuropathy is seen. EMG evaluation of the left upper extremity was normal, without evidence of a cervical radiculopathy.  Marlan Palau MD 03/28/2013 4:14 PM  [facility] [address] [place], [place] [address]  Phone [phone] Fax [phone]

## 2013-03-28 NOTE — Progress Notes (Signed)
The patient comes in today with a several week history of left shoulder and neck discomfort. The patient indicates that he is weak throughout the left arm, particularly over the last 1 or 2 weeks. The patient is unable to elevate arm past 30.  Clinical examination today shows a lot of give way weakness throughout the left arm. Right arm strength is good. The patient reports pain mainly with movement of the arm.  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 He'll be given a trial on a prednisone Dosepak. If this is ineffective, the patient will undergo x-ray evaluation of the left shoulder, and he will have a referral to an orthopedic surgeon.

## 2013-04-03 NOTE — Progress Notes (Signed)
Quick Note:  Left message for patient that NCV/EMG were within normal limits. Told to call with any questions. ______

## 2013-04-07 ENCOUNTER — Telehealth: Payer: Self-pay | Admitting: Neurology

## 2013-04-07 DIAGNOSIS — M79609 Pain in unspecified limb: Secondary | ICD-10-CM

## 2013-04-07 NOTE — Telephone Encounter (Signed)
I called patient. I talked with the mother. The patient apparently still having issues with the left shoulder. I will get the patient set up for a work. Referral. The patient may have intrinsic shoulder disease, EMG study was unremarkable.

## 2013-04-07 NOTE — Telephone Encounter (Signed)
Pt called states Dr. Anne Hahn gave him some steroid medication and advised if this didn't work to call him back, that he may have to be referred over to an Orthopedic for X-ray's per pt. Pt needs Dr. Anne Hahn or nurse to return his call concerning this matter. Thanks

## 2013-04-07 NOTE — Telephone Encounter (Signed)
TC to pt and he stated that prednisone had minimal effect (more at higher dose), then is back to where he was.  Would like to proceed with Ortho.  (your recommendation in [place]).  Please advise.  Thanks   [phone]

## 2013-04-14 ENCOUNTER — Other Ambulatory Visit: Payer: Self-pay | Admitting: Neurology

## 2013-04-14 MED ORDER — TOPIRAMATE 100 MG PO TABS: 100.0000 mg | ORAL_TABLET | Freq: Every day | ORAL | Status: DC

## 2013-04-14 MED ORDER — OXYCODONE-ACETAMINOPHEN 5-325 MG PO TABS: 1.0000 | ORAL_TABLET | Freq: Four times a day (QID) | ORAL | Status: DC | PRN

## 2013-04-21 ENCOUNTER — Telehealth: Payer: Self-pay

## 2013-04-21 NOTE — Telephone Encounter (Signed)
Message copied by [facility] on Mon Apr 21, 2013  2:39 PM ------      Message from: Beverely Low      Created: Mon Apr 21, 2013  2:25 PM       EMG/[place] normal.             ----- Message -----         From: Gearldine Shown         Sent: 04/21/2013   1:30 PM           To: Nilda Riggs, NP                   ------

## 2013-04-21 NOTE — Telephone Encounter (Signed)
I called patient and let him know that his EMG/[place] was normal.

## 2013-04-25 ENCOUNTER — Telehealth: Payer: Self-pay | Admitting: Neurology

## 2013-05-06 NOTE — Telephone Encounter (Signed)
LMVM for Micheal Roth in referrals on status.

## 2013-05-13 ENCOUNTER — Other Ambulatory Visit: Payer: Self-pay | Admitting: Neurology

## 2013-05-13 MED ORDER — OXYCODONE-ACETAMINOPHEN 5-325 MG PO TABS: 1.0000 | ORAL_TABLET | Freq: Four times a day (QID) | ORAL | Status: DC | PRN

## 2013-05-13 MED ORDER — CYCLOBENZAPRINE HCL 10 MG PO TABS: 10.0000 mg | ORAL_TABLET | Freq: Three times a day (TID) | ORAL | Status: DC | PRN

## 2013-05-13 NOTE — Telephone Encounter (Signed)
Rx's signed, ready for pick up.  I called the patient.  He is aware.

## 2013-06-09 ENCOUNTER — Other Ambulatory Visit: Payer: Self-pay | Admitting: Neurology

## 2013-06-09 ENCOUNTER — Telehealth: Payer: Self-pay | Admitting: Neurology

## 2013-06-09 DIAGNOSIS — M79609 Pain in unspecified limb: Secondary | ICD-10-CM

## 2013-06-09 MED ORDER — OXYCODONE-ACETAMINOPHEN 5-325 MG PO TABS: 1.0000 | ORAL_TABLET | Freq: Four times a day (QID) | ORAL | Status: DC | PRN

## 2013-06-09 NOTE — Telephone Encounter (Signed)
I called the patient. The pain medication prescription was written today, it is ready. The patient was referred to [facility] orthopedics, but they refused to see him. I will refer him to [facility] orthopedic.

## 2013-06-17 ENCOUNTER — Telehealth: Payer: Self-pay | Admitting: Neurology

## 2013-06-17 DIAGNOSIS — M25512 Pain in left shoulder: Secondary | ICD-10-CM

## 2013-06-17 NOTE — Telephone Encounter (Signed)
I called the patient and I talked with the wife. We have tried refer the patient to [facility] orthopedics, and [facility], the patient has credit balances at both places, both offices will not see the patient. I will try to refer the patient to [facility].

## 2013-06-20 ENCOUNTER — Telehealth: Payer: Self-pay | Admitting: Neurology

## 2013-06-20 MED ORDER — PREDNISONE 10 MG PO TABS: ORAL_TABLET | ORAL | Status: DC

## 2013-06-20 NOTE — Telephone Encounter (Signed)
I called patient. The patient is having increasing discomfort in the shoulder, lack of mobility, with spasm spreading to the neck area. The patient never had MRI evaluation of the cervical spine. The patient remains on Flexeril, and he has oxycodone to take for pain. In the past, prednisone did help transiently, I'll start a prednisone Dosepak. The patient will be seen at [facility] within the next several weeks.

## 2013-07-07 ENCOUNTER — Telehealth: Payer: Self-pay | Admitting: Neurology

## 2013-07-08 ENCOUNTER — Other Ambulatory Visit: Payer: Self-pay | Admitting: Neurology

## 2013-07-14 DIAGNOSIS — M542 Cervicalgia: Secondary | ICD-10-CM | POA: Insufficient documentation

## 2013-07-14 DIAGNOSIS — R202 Paresthesia of skin: Secondary | ICD-10-CM | POA: Insufficient documentation

## 2013-07-14 DIAGNOSIS — R29898 Other symptoms and signs involving the musculoskeletal system: Secondary | ICD-10-CM | POA: Insufficient documentation

## 2013-07-15 MED ORDER — OXYCODONE-ACETAMINOPHEN 5-325 MG PO TABS: 1.0000 | ORAL_TABLET | Freq: Four times a day (QID) | ORAL | Status: DC | PRN

## 2013-07-15 NOTE — Telephone Encounter (Signed)
Call patient and left a message stating that his prescription was ready to be picked up and if he has any questions or concerns to call the office.

## 2013-09-02 ENCOUNTER — Other Ambulatory Visit: Payer: Self-pay | Admitting: Neurology

## 2013-09-02 MED ORDER — OXYCODONE-ACETAMINOPHEN 5-325 MG PO TABS: 1.0000 | ORAL_TABLET | Freq: Four times a day (QID) | ORAL | Status: DC | PRN

## 2013-09-02 MED ORDER — TOPIRAMATE 100 MG PO TABS: 100.0000 mg | ORAL_TABLET | Freq: Every day | ORAL | Status: DC

## 2013-09-02 NOTE — Telephone Encounter (Signed)
Patient called requesting written Rx for Percocet and also needing refill on Topamax. Please call to advise

## 2013-09-30 ENCOUNTER — Other Ambulatory Visit: Payer: Self-pay | Admitting: *Deleted

## 2013-09-30 ENCOUNTER — Telehealth: Payer: Self-pay | Admitting: Neurology

## 2013-09-30 MED ORDER — OXYCODONE-ACETAMINOPHEN 5-325 MG PO TABS: 1.0000 | ORAL_TABLET | Freq: Four times a day (QID) | ORAL | Status: DC | PRN

## 2013-09-30 NOTE — Telephone Encounter (Signed)
Today is the 29th day since Rx was last written.  I called and spoke with the patient.  Explained his refills need to last 30 days.  He verbalized understanding.  I called the pharmacy, spoke with [place].  He said although we wrote the Rx on 12/30, the patient did not fill it until 01/02.  They will not fill the Rx until 02/01.  I called the patient back, explained 02/01 would be the 30th day since he did not fill the Rx when it was written.  Patient verbalized understanding and will get med refilled on 02/01

## 2013-09-30 NOTE — Telephone Encounter (Signed)
Called patient to inform him that his Rx was ready to be picked up at the front desk. I advised the patient that if he has any other problems, questions or concerns to call the office. Patient verbalized understanding.

## 2013-09-30 NOTE — Telephone Encounter (Signed)
Wayne at [employer] called in to get authorization to refill Micheal Roth's Oxyxodone 5-325 mg early.  Please call

## 2013-10-15 ENCOUNTER — Emergency Department (HOSPITAL_COMMUNITY): Payer: Self-pay

## 2013-10-15 ENCOUNTER — Encounter (HOSPITAL_COMMUNITY): Payer: Self-pay | Admitting: Emergency Medicine

## 2013-10-15 ENCOUNTER — Emergency Department (HOSPITAL_COMMUNITY)
Admission: EM | Admit: 2013-10-15 | Discharge: 2013-10-15 | Disposition: A | Discharge status: Home / Self Care | Payer: Self-pay | Attending: Emergency Medicine | Admitting: Emergency Medicine

## 2013-10-15 DIAGNOSIS — G8929 Other chronic pain: Secondary | ICD-10-CM | POA: Insufficient documentation

## 2013-10-15 DIAGNOSIS — Z79899 Other long term (current) drug therapy: Secondary | ICD-10-CM | POA: Insufficient documentation

## 2013-10-15 DIAGNOSIS — G40909 Epilepsy, unspecified, not intractable, without status epilepticus: Secondary | ICD-10-CM | POA: Insufficient documentation

## 2013-10-15 DIAGNOSIS — M549 Dorsalgia, unspecified: Secondary | ICD-10-CM

## 2013-10-15 DIAGNOSIS — Z8659 Personal history of other mental and behavioral disorders: Secondary | ICD-10-CM | POA: Insufficient documentation

## 2013-10-15 DIAGNOSIS — IMO0002 Reserved for concepts with insufficient information to code with codable children: Secondary | ICD-10-CM | POA: Insufficient documentation

## 2013-10-15 DIAGNOSIS — G43909 Migraine, unspecified, not intractable, without status migrainosus: Secondary | ICD-10-CM | POA: Insufficient documentation

## 2013-10-15 DIAGNOSIS — X500XXA Overexertion from strenuous movement or load, initial encounter: Secondary | ICD-10-CM | POA: Insufficient documentation

## 2013-10-15 DIAGNOSIS — R209 Unspecified disturbances of skin sensation: Secondary | ICD-10-CM | POA: Insufficient documentation

## 2013-10-15 DIAGNOSIS — Y929 Unspecified place or not applicable: Secondary | ICD-10-CM | POA: Insufficient documentation

## 2013-10-15 DIAGNOSIS — Y939 Activity, unspecified: Secondary | ICD-10-CM | POA: Insufficient documentation

## 2013-10-15 DIAGNOSIS — Z87891 Personal history of nicotine dependence: Secondary | ICD-10-CM | POA: Insufficient documentation

## 2013-10-15 HISTORY — DX: Dorsalgia, unspecified: M54.9

## 2013-10-15 HISTORY — DX: Other chronic pain: G89.29

## 2013-10-15 MED ORDER — OXYCODONE-ACETAMINOPHEN 5-325 MG PO TABS: 1.0000 | ORAL_TABLET | ORAL | Status: DC | PRN

## 2013-10-15 MED ORDER — OXYCODONE-ACETAMINOPHEN 5-325 MG PO TABS
1.0000 | ORAL_TABLET | Freq: Once | ORAL | Status: AC
  Administered 2013-10-15: 1 via ORAL
  Filled 2013-10-15: qty 1

## 2013-10-15 NOTE — Discharge Instructions (Signed)
Take the prescribed medication as directed.  Do not drive while taking this medication. [facility]up [facility] for routine management of your back pain. Return to the ED for new or worsening symptoms.

## 2013-10-15 NOTE — ED Notes (Signed)
Pt c/o lower back pain x 2 days since falling; pt sts hx of chronic back issues in past; pt sts worse pain with movement and denies incontinence

## 2013-10-15 NOTE — ED Notes (Signed)
Pt returned from xray

## 2013-10-15 NOTE — ED Notes (Signed)
Patient transported to X-ray 

## 2013-10-15 NOTE — ED Provider Notes (Signed)
Medical screening examination/treatment/procedure(s) were performed by non-physician practitioner and as supervising physician I was immediately available for consultation/collaboration.  EKG Interpretation   None         Alfonzo Feller, DO 10/15/13 1940

## 2013-10-15 NOTE — ED Provider Notes (Signed)
CSN: [id number]     Arrival date & time 10/15/13  4268 History   First MD Initiated Contact with Patient 10/15/13 [phone]     Chief Complaint  Patient presents with  . Back Pain   (Consider location/radiation/quality/duration/timing/severity/associated sxs/prior Treatment) The history is provided by the patient and medical records.   This is a 38 y.o. M with PMH significant for migraines, depression, seizures, chronic back pain, presenting to the ED for back pain.  Patient states he fell last night and twisted his back. He is unsure of direct trauma to his lumbar spine. He denies head trauma or loss of consciousness. Pain is worse with weightbearing and ambation. Patient has a prior history of remote back injuries and back surgeries by Dr. Susa Day of [facility] orthopedics. States he has not followed up with him because he no longer has insurance. 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  Denies loss of bowel or bladder control.  Has been taking tylenol without improvement of sx.  VS stable on arrival.  Past Medical History  Diagnosis Date  . Migraines   . Depression   . Seizures   . Chronic back pain    Past Surgical History  Procedure Laterality Date  . Knee surgery    . Back surgery     Family History  Problem Relation Age of Onset  . Diabetes Father    History  Substance Use Topics  . Smoking status: Former Smoker    Types: Cigarettes  . Smokeless tobacco: Never Used  . Alcohol Use: Yes     Comment: rarely    Review of Systems  Musculoskeletal: Positive for back pain.  All other systems reviewed and are negative.    Allergies  Review of patient's allergies indicates not on file.  Home Medications   Current Outpatient Rx  Name  Route  Sig  Dispense  Refill  . acetaminophen (TYLENOL) 500 MG tablet   Oral   Take 2,000 mg by mouth every 4  (four) hours as needed. For migraine         . cyclobenzaprine (FLEXERIL) 10 MG tablet   Oral   Take 1 tablet (10 mg total) by mouth 3 (three) times daily as needed for muscle spasms.   90 tablet   1   . diphenhydrAMINE (BENADRYL) 25 MG tablet   Oral   Take 25-50 mg by mouth every 6 (six) hours as needed. For sleep/allergies.         Marland Kitchen ibuprofen (ADVIL,MOTRIN) 200 MG tablet   Oral   Take 800 mg by mouth every 4 (four) hours as needed for headache. For pain.         Marland Kitchen omeprazole (PRILOSEC) 20 MG capsule   Oral   Take 20 mg by mouth daily.         Marland Kitchen oxyCODONE-acetaminophen (PERCOCET) 5-325 MG per tablet   Oral   Take 1 tablet by mouth every 6 (six) hours as needed. Must last 30 days   60 tablet   0     Please call patent for pick up   . predniSONE (DELTASONE) 10 MG tablet      Began taking 6 tablets daily, taper by one tablet every other day until off the medication.   42 tablet   0   . topiramate (TOPAMAX) 100 MG tablet   Oral   Take 1  tablet (100 mg total) by mouth daily.   90 tablet   0     Please Schedule Follow Up Appt    BP 143/82  Pulse 60  Temp(Src) 98.6 F (37 C) (Oral)  Resp 18  SpO2 99%  Physical Exam  Nursing note and vitals reviewed. Constitutional: He is oriented to person, place, and time. He appears well-developed and well-nourished. No distress.  HENT:  Head: Normocephalic and atraumatic.  Mouth/Throat: Oropharynx is clear and moist.  Eyes: Conjunctivae and EOM are normal. Pupils are equal, round, and reactive to light.  Neck: Normal range of motion. Neck supple.  Cardiovascular: Normal rate, regular rhythm and normal heart sounds.   Pulmonary/Chest: Effort normal and breath sounds normal. No respiratory distress. He has no wheezes.  Musculoskeletal:       Lumbar back: He exhibits tenderness, bony tenderness and pain. He exhibits no deformity and no spasm.       Back:  Lumbar spine with well healing midline surgical incision; TTP  along midline without step-off or gross deformity; full ROM maintained; LE sensation intact; ambulating unassisted without difficulty  Neurological: He is alert and oriented to person, place, and time. He has normal strength. He displays no tremor. No cranial nerve deficit or sensory deficit. He displays no seizure activity. Gait normal.  Skin: Skin is warm and dry. He is not diaphoretic.  Psychiatric: He has a normal mood and affect.    ED Course  Procedures (including critical care time) Labs Review Labs Reviewed - No data to display Imaging Review Dg Lumbar Spine Complete  10/15/2013   CLINICAL DATA:  Back pain.  EXAM: LUMBAR SPINE - COMPLETE 4+ VIEW  COMPARISON:  Lumbar spine radiograph 01/24/2013.  FINDINGS: Five views of the lumbar spine demonstrate no acute displaced fracture or definite compression type fracture. Alignment is anatomic. Mild multilevel degenerative disc disease, most pronounced at L1-L2 and L5-S1. No defects of the pars interarticularis are noted.  IMPRESSION: 1. No acute radiographic abnormality of the lumbar spine. 2. Mild multilevel degenerative disc disease.   Electronically Signed   By: Vinnie Langton M.D.   On: 10/15/2013 09:33    EKG Interpretation   None       MDM   Final diagnoses:  Back pain   Lumbar spine with midline tenderness status post fall last night. No gross deformity noted on physical exam. No signs or symptoms concerning for cauda equina. We'll obtain screening x-ray. Percocet given.  X-ray negative for acute fracture or subluxation. Patient states Percocet starting to relieve pain. Will send home with prescription for the same. Patient no longer has insurance and cannot follow with orthopedics, will give referral to wellness clinic. Patient advised modified activity until symptoms begin to improve. Discussed plan with patient, he acknowledged understanding and agreed with plan of care.  Larene Pickett, PA-C 10/15/13 1022

## 2013-10-31 ENCOUNTER — Telehealth: Payer: Self-pay | Admitting: Neurology

## 2013-10-31 ENCOUNTER — Emergency Department (HOSPITAL_COMMUNITY)
Admission: EM | Admit: 2013-10-31 | Discharge: 2013-10-31 | Disposition: A | Discharge status: Home / Self Care | Payer: Self-pay | Attending: Emergency Medicine | Admitting: Emergency Medicine

## 2013-10-31 ENCOUNTER — Emergency Department (HOSPITAL_COMMUNITY): Payer: Self-pay

## 2013-10-31 ENCOUNTER — Encounter (HOSPITAL_COMMUNITY): Payer: Self-pay | Admitting: Emergency Medicine

## 2013-10-31 DIAGNOSIS — Y929 Unspecified place or not applicable: Secondary | ICD-10-CM | POA: Insufficient documentation

## 2013-10-31 DIAGNOSIS — S139XXA Sprain of joints and ligaments of unspecified parts of neck, initial encounter: Secondary | ICD-10-CM | POA: Insufficient documentation

## 2013-10-31 DIAGNOSIS — G43909 Migraine, unspecified, not intractable, without status migrainosus: Secondary | ICD-10-CM | POA: Insufficient documentation

## 2013-10-31 DIAGNOSIS — W010XXA Fall on same level from slipping, tripping and stumbling without subsequent striking against object, initial encounter: Secondary | ICD-10-CM | POA: Insufficient documentation

## 2013-10-31 DIAGNOSIS — G8929 Other chronic pain: Secondary | ICD-10-CM | POA: Insufficient documentation

## 2013-10-31 DIAGNOSIS — S161XXA Strain of muscle, fascia and tendon at neck level, initial encounter: Secondary | ICD-10-CM

## 2013-10-31 DIAGNOSIS — Y939 Activity, unspecified: Secondary | ICD-10-CM | POA: Insufficient documentation

## 2013-10-31 DIAGNOSIS — Z79899 Other long term (current) drug therapy: Secondary | ICD-10-CM | POA: Insufficient documentation

## 2013-10-31 DIAGNOSIS — G40909 Epilepsy, unspecified, not intractable, without status epilepticus: Secondary | ICD-10-CM | POA: Insufficient documentation

## 2013-10-31 DIAGNOSIS — Z87891 Personal history of nicotine dependence: Secondary | ICD-10-CM | POA: Insufficient documentation

## 2013-10-31 DIAGNOSIS — Z8659 Personal history of other mental and behavioral disorders: Secondary | ICD-10-CM | POA: Insufficient documentation

## 2013-10-31 MED ORDER — HYDROCODONE-ACETAMINOPHEN 5-325 MG PO TABS: 1.0000 | ORAL_TABLET | Freq: Four times a day (QID) | ORAL | Status: DC | PRN

## 2013-10-31 NOTE — Telephone Encounter (Signed)
Patient is requesting a refill on Percocet.  We last prescribed this med on 01/27 for #60 noting it must last 30 days.  Patient got a Rx for #15 through ED on 02/11.  Okay to prescribe again at this time?  Please advise.  Thank you.

## 2013-10-31 NOTE — ED Notes (Signed)
Patient states he usually treats his neck/shoulder with acetaminophen.   He took 4 500 mg tylenol today at 1000am.

## 2013-10-31 NOTE — ED Provider Notes (Signed)
CSN: [id number]     Arrival date & time 10/31/13  1120 History  This chart was scribed for non-physician practitioner, Renold Genta, PA-C, working with Mervin Kung, MD by Ladene Artist, ED Scribe. This patient was seen in room TR04C/TR04C and the patient's care was started at 1:59 PM.   Chief Complaint  Patient presents with  . Neck Pain    The history is provided by the patient. No language interpreter was used.    HPI Comments: Micheal Roth is a 38 y.o. male with history of chronic neck and back pain who presents to the Emergency Department complaining of worsening posterior neck pain that began yesterday after he slipped on ice and fell. He states that he fell onto his side and back and his neck snapped during the descent. He currently rates pain as 10/10, but at baseline it is usually 6/10. He also has baseline left upper extremity weakness. He denies nausea, vomiting or loss of consciousness. He has taken Tylenol and ibuprofen with some relief. Patient has been seeing neurology at [facility] and has had several MRIs and CTs, but no specific cause for pain or extremity weakness has been identified. His doctors also recommend PT to try to improve strength in his left arm. His other medical history includes migraines and seizures.   Past Medical History  Diagnosis Date  . Migraines   . Depression   . Seizures   . Chronic back pain    Past Surgical History  Procedure Laterality Date  . Knee surgery    . Back surgery     Family History  Problem Relation Age of Onset  . Diabetes Father    History  Substance Use Topics  . Smoking status: Former Smoker    Types: Cigarettes  . Smokeless tobacco: Never Used  . Alcohol Use: Yes     Comment: rarely    Review of Systems  Gastrointestinal: Negative for nausea and vomiting.  Musculoskeletal: Positive for neck pain.  Neurological: Positive for weakness. Negative for syncope.      Allergies  Review of  patient's allergies indicates no known allergies.  Home Medications   Current Outpatient Rx  Name  Route  Sig  Dispense  Refill  . acetaminophen (TYLENOL) 500 MG tablet   Oral   Take 2,000 mg by mouth every 4 (four) hours as needed. For migraine         . ibuprofen (ADVIL,MOTRIN) 200 MG tablet   Oral   Take 800 mg by mouth every 4 (four) hours as needed for headache. For pain.         Marland Kitchen topiramate (TOPAMAX) 100 MG tablet   Oral   Take 100 mg by mouth daily.         Marland Kitchen oxyCODONE-acetaminophen (PERCOCET/ROXICET) 5-325 MG per tablet   Oral   Take 1 tablet by mouth every 4 (four) hours as needed. Must last 28 days.          Triage Vitals: BP 135/70  Pulse 70  Temp(Src) 97.9 F (36.6 C) (Oral)  Resp 18  SpO2 96% Physical Exam  Nursing note and vitals reviewed. Constitutional: He is oriented to person, place, and time. He appears well-developed and well-nourished. No distress.  HENT:  Head: Normocephalic and atraumatic.  Eyes: EOM are normal.  Neck: Neck supple. No tracheal deviation present.  Cardiovascular: Normal rate.   Pulmonary/Chest: Effort normal. No respiratory distress.  Musculoskeletal: Normal range of motion.  Midline cervical spine tenderness,  left trapezius tenderness. Patient has significant weakness to the left arm, unable to raise it past 90. He has decreased sensation to the soft and sharp touch over all dermatomes and entire left arm. His prescription strength is 4-5. Patient states is chronic  Neurological: He is alert and oriented to person, place, and time.  Skin: Skin is warm and dry.  Psychiatric: He has a normal mood and affect. His behavior is normal.    ED Course  Procedures  DIAGNOSTIC STUDIES: Oxygen Saturation is 96% on room air, adequate by my interpretation.    COORDINATION OF CARE: 2:19 PM- Patient informed of current plan for treatment and evaluation and agrees with plan at this time.    Labs Review Labs Reviewed - No data to  display   Imaging Review Dg Cervical Spine Complete  10/31/2013   CLINICAL DATA:  NECK PAIN  EXAM: CERVICAL SPINE  4+ VIEWS  COMPARISON:  DG CERVICAL SPINE COMPLETE dated 03/04/2013  FINDINGS: There is no evidence of cervical spine fracture or prevertebral soft tissue swelling. Alignment is normal. No other significant bone abnormalities are identified.  IMPRESSION: Negative cervical spine radiographs.   Electronically Signed   By: Margaree Mackintosh M.D.   On: 10/31/2013 14:39    EKG Interpretation  None  MDM   Final diagnoses:  Cervical strain   Patient after a mechanical fall after slipping on ice yesterday. He has history of chronic back pain and chronic weakness and numbness to left arm. He states that he is followed by [facility] who is unable to figure out the reason for his left arm weakness. Yesterday patient fell on ice and reinjured his neck. He did not hit his head on the ground. There is no head injury or any other injuries. He states pain feels the same as prior only worse. X-rays were obtained and are negative. Plan to followup with his doctors and [place], Norco and ibuprofen for pain at home.  Filed Vitals:   10/31/13 1124  BP: 135/70  Pulse: 70  Temp: 97.9 F (36.6 C)  TempSrc: Oral  Resp: 18  SpO2: 96%   I personally performed the services described in this documentation, which was scribed in my presence. The recorded information has been reviewed and is accurate.    Renold Genta, PA-C 10/31/13 [phone]

## 2013-10-31 NOTE — Telephone Encounter (Signed)
I will hold off on writing the prescription until next Monday.

## 2013-10-31 NOTE — ED Notes (Signed)
Per pt sts slipped on some ice yesterday and fell on his back and left side. sts now neck pain, shoulder pain and left arm pain. sts hx of the same but worse.

## 2013-10-31 NOTE — Discharge Instructions (Signed)
Continue pain medications as prescribed. Follow up with your doctor at [facility].    Cervical Sprain A cervical sprain is an injury in the neck in which the strong, fibrous tissues (ligaments) that connect your neck bones stretch or tear. Cervical sprains can range from mild to severe. Severe cervical sprains can cause the neck vertebrae to be unstable. This can lead to damage of the spinal cord and can result in serious nervous system problems. The amount of time it takes for a cervical sprain to get better depends on the cause and extent of the injury. Most cervical sprains heal in 1 to 3 weeks. CAUSES  Severe cervical sprains may be caused by:   Contact sport injuries (such as from football, rugby, wrestling, hockey, auto racing, gymnastics, diving, martial arts, or boxing).   Motor vehicle collisions.   Whiplash injuries. This is an injury from a sudden forward-and backward whipping movement of the head and neck.  Falls.  Mild cervical sprains may be caused by:   Being in an awkward position, such as while cradling a telephone between your ear and shoulder.   Sitting in a chair that does not offer proper support.   Working at a poorly [employer] station.   Looking up or down for long periods of time.  SYMPTOMS   Pain, soreness, stiffness, or a burning sensation in the front, back, or sides of the neck. This discomfort may develop immediately after the injury or slowly, 24 hours or more after the injury.   Pain or tenderness directly in the middle of the back of the neck.   Shoulder or upper back pain.   Limited ability to move the neck.   Headache.   Dizziness.   Weakness, numbness, or tingling in the hands or arms.   Muscle spasms.   Difficulty swallowing or chewing.   Tenderness and swelling of the neck.  DIAGNOSIS  Most of the time your health care provider can diagnose a cervical sprain by taking your history and doing a physical exam. Your  health care provider will ask about previous neck injuries and any known neck problems, such as arthritis in the neck. X-rays may be taken to find out if there are any other problems, such as with the bones of the neck. Other tests, such as a CT scan or MRI, may also be needed.  TREATMENT  Treatment depends on the severity of the cervical sprain. Mild sprains can be treated with rest, keeping the neck in place (immobilization), and pain medicines. Severe cervical sprains are immediately immobilized. Further treatment is done to help with pain, muscle spasms, and other symptoms and may include:  Medicines, such as pain relievers, numbing medicines, or muscle relaxants.   Physical therapy. This may involve stretching exercises, strengthening exercises, and posture training. Exercises and improved posture can help stabilize the neck, strengthen muscles, and help stop symptoms from returning.  HOME CARE INSTRUCTIONS   Put ice on the injured area.   Put ice in a plastic bag.   Place a towel between your skin and the bag.   Leave the ice on for 15 20 minutes, 3 4 times a day.   If your injury was severe, you may have been given a cervical collar to wear. A cervical collar is a two-piece collar designed to keep your neck from moving while it heals.  Do not remove the collar unless instructed by your health care provider.  If you have long hair, keep it outside of  the collar.  Ask your health care provider before making any adjustments to your collar. Minor adjustments may be required over time to improve comfort and reduce pressure on your chin or on the back of your head.  Ifyou are allowed to remove the collar for cleaning or bathing, follow your health care provider's instructions on how to do so safely.  Keep your collar clean by wiping it with mild soap and water and drying it completely. If the collar you have been given includes removable pads, remove them every 1 2 days and hand  wash them with soap and water. Allow them to air dry. They should be completely dry before you wear them in the collar.  If you are allowed to remove the collar for cleaning and bathing, wash and dry the skin of your neck. Check your skin for irritation or sores. If you see any, tell your health care provider.  Do not drive while wearing the collar.   Only take over-the-counter or prescription medicines for pain, discomfort, or fever as directed by your health care provider.   Keep all follow-up appointments as directed by your health care provider.   Keep all physical therapy appointments as directed by your health care provider.   Make any needed adjustments to your workstation to promote good posture.   Avoid positions and activities that make your symptoms worse.   Warm up and stretch before being active to help prevent problems.  SEEK MEDICAL CARE IF:   Your pain is not controlled with medicine.   You are unable to decrease your pain medicine over time as planned.   Your activity level is not improving as expected.  SEEK IMMEDIATE MEDICAL CARE IF:   You develop any bleeding.  You develop stomach upset.  You have signs of an allergic reaction to your medicine.   Your symptoms get worse.   You develop new, unexplained symptoms.   You have numbness, tingling, weakness, or paralysis in any part of your body.  MAKE SURE YOU:   Understand these instructions.  Will watch your condition.  Will get help right away if you are not doing well or get worse. Document Released: 06/18/2007 Document Revised: 06/11/2013 Document Reviewed: 02/26/2013 [facility] Patient Information 2014 [place].

## 2013-10-31 NOTE — Telephone Encounter (Signed)
Patient called to state that he needs a refill of his Percocet. Please call when it is ready for pick up.

## 2013-11-03 ENCOUNTER — Other Ambulatory Visit: Payer: Self-pay | Admitting: Neurology

## 2013-11-03 MED ORDER — OXYCODONE-ACETAMINOPHEN 5-325 MG PO TABS: 1.0000 | ORAL_TABLET | Freq: Four times a day (QID) | ORAL | Status: DC | PRN

## 2013-11-03 NOTE — Telephone Encounter (Signed)
I will renew the prescription for the opiate medication, oxycodone. This must last 28 days.

## 2013-11-03 NOTE — Telephone Encounter (Signed)
Called patient to inform him that his Rx was ready to be picked up at the front desk and if he has any other problems, questions or concerns to call the office. Patient verbalized understanding.

## 2013-11-03 NOTE — ED Provider Notes (Signed)
Medical screening examination/treatment/procedure(s) were performed by non-physician practitioner and as supervising physician I was immediately available for consultation/collaboration.   EKG Interpretation None        Mervin Kung, MD 11/03/13 1014

## 2013-12-01 ENCOUNTER — Other Ambulatory Visit: Payer: Self-pay | Admitting: Neurology

## 2013-12-01 MED ORDER — OXYCODONE-ACETAMINOPHEN 5-325 MG PO TABS: 1.0000 | ORAL_TABLET | Freq: Four times a day (QID) | ORAL | Status: DC | PRN

## 2013-12-01 NOTE — Telephone Encounter (Signed)
Patient calling for Percocet refill. He uses [facility].  Please call to advise.

## 2013-12-01 NOTE — Telephone Encounter (Signed)
Called pt to inform him that his Rx was ready to be picked up at the front desk and if he has any other problems, questions or concerns to call the office. Pt verbalized understanding. °

## 2013-12-29 ENCOUNTER — Other Ambulatory Visit: Payer: Self-pay | Admitting: Neurology

## 2013-12-29 MED ORDER — OXYCODONE-ACETAMINOPHEN 5-325 MG PO TABS: 1.0000 | ORAL_TABLET | Freq: Four times a day (QID) | ORAL | Status: DC | PRN

## 2013-12-29 MED ORDER — TOPIRAMATE 100 MG PO TABS: 100.0000 mg | ORAL_TABLET | Freq: Every day | ORAL | Status: DC

## 2013-12-29 NOTE — Telephone Encounter (Signed)
Called pt to inform him that his Rx was ready to be picked up at the front desk and if he has any other problems, questions or concerns to call the office. Pt verbalized understanding. °

## 2013-12-29 NOTE — Telephone Encounter (Signed)
Pt called for written prescription for oxyCODONE-acetaminophen (PERCOCET/ROXICET) 5-325 MG per tablet and his prescription on his topiramate (TOPAMAX) 100 MG tablet. Please call pt when ready for p/u and if he has problems getting his Topiramate. Pt is schedule for apt in Sept with Dr. Jannifer Franklin. Thanks

## 2013-12-29 NOTE — Telephone Encounter (Signed)
Request forwarded to provider for approval  

## 2014-01-28 ENCOUNTER — Other Ambulatory Visit: Payer: Self-pay | Admitting: Neurology

## 2014-01-28 MED ORDER — OXYCODONE-ACETAMINOPHEN 5-325 MG PO TABS: 1.0000 | ORAL_TABLET | Freq: Four times a day (QID) | ORAL | Status: DC | PRN

## 2014-01-28 NOTE — Telephone Encounter (Signed)
Patient requesting rx refill for oxyCODONE-acetaminophen (PERCOCET/ROXICET) 5-325 MG per tablet.  Please call when ready for pick up.  Thanks

## 2014-01-28 NOTE — Telephone Encounter (Signed)
Request forwarded to provider for approval  

## 2014-01-29 NOTE — Telephone Encounter (Signed)
Called pt and left message informing him that his Rx was ready to be picked up at the front desk and if he has any other problems, questions or concerns to call the office. °

## 2014-02-25 ENCOUNTER — Other Ambulatory Visit: Payer: Self-pay | Admitting: Neurology

## 2014-02-25 MED ORDER — OXYCODONE-ACETAMINOPHEN 5-325 MG PO TABS: 1.0000 | ORAL_TABLET | Freq: Four times a day (QID) | ORAL | Status: DC | PRN

## 2014-02-25 NOTE — Telephone Encounter (Signed)
Called pt and spoke with pt's mother Micheal Roth informing her that the pt's Rx was ready to be picked up at the front desk and if the pt has any other problems, questions or concerns to call the office. Mother verbalized understanding.

## 2014-02-25 NOTE — Telephone Encounter (Signed)
Pt called requesting written prescription oxyCODONE-acetaminophen (PERCOCET/ROXICET) 5-325 MG per tablet. Please call pt when ready for pick up. Thanks

## 2014-02-25 NOTE — Telephone Encounter (Signed)
Request forwarded to provider for approval  

## 2014-03-24 ENCOUNTER — Other Ambulatory Visit: Payer: Self-pay | Admitting: Neurology

## 2014-03-24 MED ORDER — OXYCODONE-ACETAMINOPHEN 5-325 MG PO TABS: 1.0000 | ORAL_TABLET | Freq: Four times a day (QID) | ORAL | Status: DC | PRN

## 2014-03-24 NOTE — Telephone Encounter (Signed)
Tomorrow will be the 28th day.  Request forwarded to provider for approval

## 2014-03-24 NOTE — Telephone Encounter (Signed)
Called pt and spoke with pt's mother Micheal Roth, informing her that the pt's Rx was ready to be picked up at the front desk and if the pt has any other problems, questions or concerns to call the office. Mother verbalized understanding.

## 2014-03-24 NOTE — Telephone Encounter (Signed)
Patient requesting Rx refill for oxyCODONE-acetaminophen (PERCOCET/ROXICET) 5-325 MG per tablet.  Requesting you forward to Pharmacy.  Please call and advise.  Thanks

## 2014-04-22 ENCOUNTER — Other Ambulatory Visit: Payer: Self-pay | Admitting: Neurology

## 2014-04-22 MED ORDER — OXYCODONE-ACETAMINOPHEN 5-325 MG PO TABS: 1.0000 | ORAL_TABLET | Freq: Four times a day (QID) | ORAL | Status: DC | PRN

## 2014-04-22 NOTE — Telephone Encounter (Signed)
Request forwarded to provider for approval  

## 2014-04-23 ENCOUNTER — Telehealth: Payer: Self-pay | Admitting: *Deleted

## 2014-04-23 NOTE — Telephone Encounter (Signed)
Staff message sent by Jannifer Franklin to schedule appointment with NP. Patient already has appointment with Dr. Jannifer Franklin on 05/15/14. He can move up to earlier appointment if he needs to with NP (CM) is who is has been seeing.

## 2014-04-23 NOTE — Telephone Encounter (Signed)
Called pt to inform him that his Rx was ready to be picked up at the front desk and if he has any other problems, questions or concerns to call the office. Pt verbalized understanding. °

## 2014-04-28 ENCOUNTER — Emergency Department (HOSPITAL_COMMUNITY): Payer: Self-pay

## 2014-04-28 ENCOUNTER — Encounter (HOSPITAL_COMMUNITY): Payer: Self-pay | Admitting: Emergency Medicine

## 2014-04-28 ENCOUNTER — Emergency Department (HOSPITAL_COMMUNITY)
Admission: EM | Admit: 2014-04-28 | Discharge: 2014-04-28 | Disposition: A | Discharge status: Home / Self Care | Payer: Self-pay | Attending: Emergency Medicine | Admitting: Emergency Medicine

## 2014-04-28 DIAGNOSIS — S59909A Unspecified injury of unspecified elbow, initial encounter: Secondary | ICD-10-CM | POA: Insufficient documentation

## 2014-04-28 DIAGNOSIS — S6990XA Unspecified injury of unspecified wrist, hand and finger(s), initial encounter: Principal | ICD-10-CM

## 2014-04-28 DIAGNOSIS — Z87891 Personal history of nicotine dependence: Secondary | ICD-10-CM | POA: Insufficient documentation

## 2014-04-28 DIAGNOSIS — X500XXA Overexertion from strenuous movement or load, initial encounter: Secondary | ICD-10-CM | POA: Insufficient documentation

## 2014-04-28 DIAGNOSIS — S59919A Unspecified injury of unspecified forearm, initial encounter: Principal | ICD-10-CM

## 2014-04-28 DIAGNOSIS — Y9289 Other specified places as the place of occurrence of the external cause: Secondary | ICD-10-CM | POA: Insufficient documentation

## 2014-04-28 DIAGNOSIS — G8929 Other chronic pain: Secondary | ICD-10-CM | POA: Insufficient documentation

## 2014-04-28 DIAGNOSIS — Z8659 Personal history of other mental and behavioral disorders: Secondary | ICD-10-CM | POA: Insufficient documentation

## 2014-04-28 DIAGNOSIS — Z8679 Personal history of other diseases of the circulatory system: Secondary | ICD-10-CM | POA: Insufficient documentation

## 2014-04-28 DIAGNOSIS — Y9389 Activity, other specified: Secondary | ICD-10-CM | POA: Insufficient documentation

## 2014-04-28 DIAGNOSIS — M2550 Pain in unspecified joint: Secondary | ICD-10-CM

## 2014-04-28 MED ORDER — IBUPROFEN 800 MG PO TABS: 800.0000 mg | ORAL_TABLET | Freq: Three times a day (TID) | ORAL | Status: DC

## 2014-04-28 NOTE — Discharge Instructions (Signed)

## 2014-04-28 NOTE — ED Provider Notes (Signed)
CSN: [id number]     Arrival date & time 04/28/14  0804 History   First MD Initiated Contact with Patient 04/28/14 0831     Chief Complaint  Patient presents with  . Elbow Pain     (Consider location/radiation/quality/duration/timing/severity/associated sxs/prior Treatment) HPI Comments: Patient presents to the ER for evaluation of right elbow pain. Patient reports that he injured his right elbow approximately a week ago. Patient reports that he was using large crimpers at work when the pain started. He was squeezing the 2 handles together and felt a pop in the right elbow. He reports that it hurt for several days and that got better. Yesterday doing the same motion he had onset of the pain once again. Pain is constant but worsens with movement. He reports that the pain is moderate to severe.   Past Medical History  Diagnosis Date  . Migraines   . Depression   . Seizures   . Chronic back pain    Past Surgical History  Procedure Laterality Date  . Knee surgery    . Back surgery     Family History  Problem Relation Age of Onset  . Diabetes Father    History  Substance Use Topics  . Smoking status: Former Smoker    Types: Cigarettes  . Smokeless tobacco: Never Used  . Alcohol Use: Yes     Comment: rarely    Review of Systems  Musculoskeletal: Positive for arthralgias.      Allergies  Review of patient's allergies indicates no known allergies.  Home Medications   Prior to Admission medications   Medication Sig Start Date End Date Taking? Authorizing Provider  acetaminophen (TYLENOL) 500 MG tablet Take 1,500-2,000 mg by mouth every 4 (four) hours as needed for mild pain. For migraine   Yes Historical Provider, MD  oxyCODONE-acetaminophen (PERCOCET/ROXICET) 5-325 MG per tablet Take 1 tablet by mouth every 6 (six) hours as needed. Must last 28 days. 04/22/14  Yes Kathrynn Ducking, MD   BP 133/76  Pulse 60  Temp(Src) 98 F (36.7 C) (Oral)  Resp 16  Wt 210 lb (95.255  kg)  SpO2 99% Physical Exam  Musculoskeletal:       Right elbow: He exhibits normal range of motion, no swelling and no deformity. Tenderness (diffuse anterirorly - distal bicep and proximal forearm) found.  Neurological: He is alert. He has normal strength. No cranial nerve deficit or sensory deficit. GCS eye subscore is 4. GCS verbal subscore is 5. GCS motor subscore is 6.  Skin: Skin is warm, dry and intact. No rash noted. No erythema.    ED Course  Procedures (including critical care time) Labs Review Labs Reviewed - No data to display  Imaging Review No results found.   EKG Interpretation None      MDM   Final diagnoses:  None   right elbow injury  Patient presents to the ER for evaluation of a right elbow pain. Patient had acute onset of pain while working with a total of one week ago. He reports he felt a pop and had pain for several days which then improved. He went back to work, started having pain once again. Pain is diffuse over the distal bicep region across the antecubital fossa and into the proximal forearm. There are no biceps muscle deficits. No direct tenderness over the radial head. X-ray was negative. He has normal range of motion, although there is pain with range of motion. This appears to be soft tissue in  nature, likely inflammatory. No overlying skin changes to suggest infection. No noted swelling or concern for effusion.  Patient does have a history of chronic pain for which he receives Percocet. He can continue the Percocet, will add high-dose ibuprofen. Sling for comfort, patient understands he is to range of motion exercises. At this point I cannot rule out a bicep injury, although I do not see any deficit. Patient will therefore be referred to orthopedics for further evaluation.    Orpah Greek, MD 04/28/14 [phone]

## 2014-04-28 NOTE — ED Notes (Signed)
Pt alert, arrives from home, c/o right elbow pain, onset was last week, states injured while working, states pain has returned today, resp even unlabored, skin pwd

## 2014-05-15 ENCOUNTER — Telehealth: Payer: Self-pay

## 2014-05-15 ENCOUNTER — Ambulatory Visit: Payer: Self-pay | Admitting: Neurology

## 2014-05-15 ENCOUNTER — Telehealth: Payer: Self-pay | Admitting: Neurology

## 2014-05-15 NOTE — Telephone Encounter (Signed)
Message copied by Yates Decamp on Fri May 15, 2014 10:15 AM ------      Message from: Margette Fast      Created: Fri May 15, 2014  9:49 AM       This patient did not show for a revisit today. Last revisit was July 2014. No more refills on Percocet until the patient is seen through this office please. ------

## 2014-05-15 NOTE — Telephone Encounter (Signed)
This patient did not show for a revisit appointment today. 

## 2014-05-15 NOTE — Telephone Encounter (Signed)
Per Dr Jannifer Franklin no refills on Percocet until the patient is seen.

## 2014-05-21 ENCOUNTER — Encounter: Payer: Self-pay | Admitting: Neurology

## 2014-06-22 ENCOUNTER — Emergency Department (HOSPITAL_COMMUNITY)
Admission: EM | Admit: 2014-06-22 | Discharge: 2014-06-22 | Disposition: A | Discharge status: Home / Self Care | Payer: Self-pay | Attending: Emergency Medicine | Admitting: Emergency Medicine

## 2014-06-22 ENCOUNTER — Encounter (HOSPITAL_COMMUNITY): Payer: Self-pay | Admitting: Emergency Medicine

## 2014-06-22 ENCOUNTER — Emergency Department (HOSPITAL_COMMUNITY): Payer: Self-pay

## 2014-06-22 DIAGNOSIS — G8929 Other chronic pain: Secondary | ICD-10-CM | POA: Insufficient documentation

## 2014-06-22 DIAGNOSIS — R0789 Other chest pain: Secondary | ICD-10-CM | POA: Insufficient documentation

## 2014-06-22 DIAGNOSIS — G43909 Migraine, unspecified, not intractable, without status migrainosus: Secondary | ICD-10-CM | POA: Insufficient documentation

## 2014-06-22 DIAGNOSIS — Z8659 Personal history of other mental and behavioral disorders: Secondary | ICD-10-CM | POA: Insufficient documentation

## 2014-06-22 DIAGNOSIS — Z87891 Personal history of nicotine dependence: Secondary | ICD-10-CM | POA: Insufficient documentation

## 2014-06-22 HISTORY — DX: Other chronic pain: G89.29

## 2014-06-22 HISTORY — DX: Cervicalgia: M54.2

## 2014-06-22 HISTORY — DX: Paresthesia of skin: R20.2

## 2014-06-22 HISTORY — DX: Pain in left shoulder: M25.512

## 2014-06-22 MED ORDER — NAPROXEN 250 MG PO TABS: 250.0000 mg | ORAL_TABLET | Freq: Two times a day (BID) | ORAL | Status: DC | PRN

## 2014-06-22 MED ORDER — OXYCODONE-ACETAMINOPHEN 5-325 MG PO TABS
2.0000 | ORAL_TABLET | Freq: Once | ORAL | Status: AC
  Administered 2014-06-22: 2 via ORAL
  Filled 2014-06-22: qty 2

## 2014-06-22 MED ORDER — IBUPROFEN 200 MG PO TABS
400.0000 mg | ORAL_TABLET | Freq: Once | ORAL | Status: AC
Start: 2014-06-22 — End: 2014-06-22
  Administered 2014-06-22: 400 mg via ORAL
  Filled 2014-06-22: qty 2

## 2014-06-22 MED ORDER — METHOCARBAMOL 500 MG PO TABS: 1000.0000 mg | ORAL_TABLET | Freq: Four times a day (QID) | ORAL | Status: DC | PRN

## 2014-06-22 MED ORDER — OXYCODONE-ACETAMINOPHEN 5-325 MG PO TABS: ORAL_TABLET | ORAL | Status: DC

## 2014-06-22 NOTE — ED Notes (Signed)
Patient states right sided rib area pain starting yesterday afternoon, patient denies any trauma and is unaware of any additional movements which may have caused pain/soreness, patient states some sob with exertion, describes pain as stabbing pain

## 2014-06-22 NOTE — Discharge Planning (Signed)
Gap Specialist  Spoke to patient regarding follow up care and primary care resources. Patient is in [facility] and would not qualify for the [facility] orange card. Resource guide and my contact information provided for any future questions or concerns. No other [facility] Specialist needs identified at this time.

## 2014-06-22 NOTE — ED Provider Notes (Signed)
CSN: [id number]     Arrival date & time 06/22/14  3086 History   First MD Initiated Contact with Patient 06/22/14 [phone]     Chief Complaint  Patient presents with  . Pleurisy    right sided rib pain       HPI Pt was seen at 0910. Per pt, c/o gradual onset and persistence of constant right sided chest wall "pain" that began yesterday morning. States the pain began "when I was getting dressed." Describes the pain as "sharp" and "sore." Denies direct injury, no abd pain, no N/V/D, no SOB/cough, no fevers, no rash.     Past Medical History  Diagnosis Date  . Migraines   . Depression   . Seizures   . Chronic back pain   . Chronic neck pain   . Paresthesia of left arm and leg     and weakness of left arm and leg  . Chronic left shoulder pain    Past Surgical History  Procedure Laterality Date  . Knee surgery    . Back surgery     Family History  Problem Relation Age of Onset  . Diabetes Father    History  Substance Use Topics  . Smoking status: Former Smoker    Types: Cigarettes  . Smokeless tobacco: Never Used  . Alcohol Use: No     Comment: rarely    Review of Systems ROS: Statement: All systems negative except as marked or noted in the HPI; Constitutional: Negative for fever and chills. ; ; Eyes: Negative for eye pain, redness and discharge. ; ; ENMT: Negative for ear pain, hoarseness, nasal congestion, sinus pressure and sore throat. ; ; Cardiovascular: Negative for chest pain, palpitations, diaphoresis, dyspnea and peripheral edema. ; ; Respiratory: Negative for cough, wheezing and stridor. ; ; Gastrointestinal: Negative for nausea, vomiting, diarrhea, abdominal pain, blood in stool, hematemesis, jaundice and rectal bleeding. ; ; Genitourinary: Negative for dysuria, flank pain and hematuria. ; ; Musculoskeletal: +right lateral ribs pain. Negative for back pain and neck pain. Negative for swelling and trauma.; ; Skin: Negative for pruritus, rash, abrasions, blisters,  bruising and skin lesion.; ; Neuro: Negative for headache, lightheadedness and neck stiffness. Negative for weakness, altered level of consciousness , altered mental status, extremity weakness, paresthesias, involuntary movement, seizure and syncope.      Allergies  Review of patient's allergies indicates no known allergies.  Home Medications   Prior to Admission medications   Medication Sig Start Date End Date Taking? Authorizing Provider  acetaminophen (TYLENOL) 500 MG tablet Take 1,500-2,000 mg by mouth every 4 (four) hours as needed for mild pain. For migraine   Yes Historical Provider, MD   BP 126/73  Pulse 69  Temp(Src) 97.7 F (36.5 C) (Oral)  Resp 22  Ht 6' (1.829 m)  Wt 200 lb (90.719 kg)  BMI 27.12 kg/m2  SpO2 100% Physical Exam 0915: Physical examination:  Nursing notes reviewed; Vital signs and O2 SAT reviewed;  Constitutional: Well developed, Well nourished, Well hydrated, In no acute distress; Head:  Normocephalic, atraumatic; Eyes: EOMI, PERRL, No scleral icterus; ENMT: Mouth and pharynx normal, Mucous membranes moist; Neck: Supple, Full range of motion, No lymphadenopathy; Cardiovascular: Regular rate and rhythm, No murmur, rub, or gallop; Respiratory: Breath sounds clear & equal bilaterally, No rales, rhonchi, wheezes.  Speaking full sentences with ease, Normal respiratory effort/excursion; Chest: +right lateral ribs tender to palp. No rash, no soft tissue crepitus, no deformity. Movement normal; Abdomen: Soft, Nontender, Nondistended, Normal bowel sounds;  Genitourinary: No CVA tenderness; Extremities: Pulses normal, No tenderness, No edema, No calf edema or asymmetry.; Neuro: AA&Ox3, Major CN grossly intact.  Speech clear. No gross focal motor or sensory deficits in extremities.; Skin: Color normal, Warm, Dry.   ED Course  Procedures    MDM  MDM Reviewed: previous chart, nursing note and vitals Interpretation: x-ray    Dg Ribs Unilateral W/chest  Right 06/22/2014   CLINICAL DATA:  Pleurisy.  Right sided rib pain.  EXAM: RIGHT RIBS AND CHEST - 3+ VIEW  COMPARISON:  Two-view chest x-ray 10/25/2007.  FINDINGS: The heart size is normal.  The lungs are clear.  Dedicated imaging of the right-sided ribs demonstrates no evidence for acute or healing fracture. There is no pneumothorax.  IMPRESSION: 1. No acute pulmonary disease. 2. No evidence for acute or healing rib fracture.   Electronically Signed   By: Lawrence Santiago M.D.   On: 06/22/2014 09:15    0935:   No acute findings on XR. Doubt PE as cause for symptoms with low risk [place]. Will tx symptomatically at this time. Dx and testing d/w pt and family.  Questions answered.  Verb understanding, agreeable to d/c home with outpt f/u.   Francine Graven, DO 06/24/14 1650

## 2014-06-22 NOTE — ED Notes (Signed)
Patient returning from xray at this time

## 2014-06-22 NOTE — ED Notes (Signed)
Patient to xray at this time

## 2014-06-22 NOTE — Discharge Instructions (Signed)
°Emergency Department Resource Guide °1) Find a Doctor and Pay Out of Pocket °Although you won't have to find out who is covered by your insurance plan, it is a good idea to ask around and get recommendations. You will then need to call the office and see if the doctor you have chosen will accept you as a new patient and what types of options they offer for patients who are self-pay. Some doctors offer discounts or will set up payment plans for their patients who do not have insurance, but you will need to ask so you aren't surprised when you get to your appointment. ° °2) Contact Your Local Health Department °Not all health departments have doctors that can see patients for sick visits, but many do, so it is worth a call to see if yours does. If you don't know where your local health department is, you can check in your phone book. The CDC also has a tool to help you locate your state's health department, and many state websites also have listings of all of their local health departments. ° °3) Find a Walk-in Clinic °If your illness is not likely to be very severe or complicated, you may want to try a walk in clinic. These are popping up all over the country in pharmacies, drugstores, and shopping centers. They're usually staffed by nurse practitioners or physician assistants that have been trained to treat common illnesses and complaints. They're usually fairly quick and inexpensive. However, if you have serious medical issues or chronic medical problems, these are probably not your best option. ° °No Primary Care Doctor: °- Call Health Connect at  832-8000 - they can help you locate a primary care doctor that  accepts your insurance, provides certain services, etc. °- Physician Referral Service- 1-800-533-3463 ° °Chronic Pain Problems: °Organization         Address  Phone   Notes  °[facility]  (336) 297-2271 Patients need to be referred by their primary care doctor.  ° °Medication  Assistance: °Organization         Address  Phone   Notes  °[place] [facility] [address] °[place], [place] [address] (336) 641-8030 --Must be a resident of [place] °-- Must have NO insurance coverage whatsoever (no Medicaid/ Medicare, etc.) °-- The pt. MUST have a primary care doctor that directs their care regularly and follows them in the community °  °[facility]  (866) 331-1348   °[employer]  (888) 892-1162   ° °Agencies that provide inexpensive medical care: °Organization         Address  Phone   Notes  °[facility]  (336) 832-8035   °[facility]    (336) 832-7272   °[facility] [address] °[place], [place] [address] (336) 832-4777   °[facility] [address], °[place] (336) 271-4999   °[facility]    (336) 373-0678   °[facility]    (336) 272-1050   °[facility] ° [address], [place] Phone:  (336) 832-4444, Fax:  (336) 832-4440 Hours of Operation:  9 am - 6 pm, M-F.  Also accepts Medicaid/Medicare and self-pay.  °[facility] ° [address],  Phone: (336) 832-3150, Fax: (336) 832-3151. Hours of Operation:  8:30 am - 5:30 pm, M-F.  Also accepts Medicaid and self-pay.  °[facility] [place] [address], [place] Phone: (336) 878-6027   °[facility] [address], [place], [place] (336)723-1848, Ext. 123 Mondays & Thursdays: 7-9 AM.  First 15 patients are seen on a first come, first serve basis. °  ° °Medicaid-accepting [place] Providers: ° °Organization         Address  Phone   Notes  °[facility] [address], [place] (336) 641-2100 Also accepts self-pay patients.  °[facility] [address], [place] ° (336) 856-9996   °[facility] [address], [place]  (336) 288-8857   °[facility] [address] [place] [address], [place] (336) 299-7000   °Veita Bland [address], [place]  ° (336) 373-1557 Only accepts [place] Access Medicaid patients after they have their name applied to their card.  ° °Self-Pay (no insurance) in [place]: ° °Organization         Address  Phone   Notes  °Sickle Cell Patients, [facility] [address], [place] (336) 832-1970   °[facility] [address], [place] (336) 832-4400   °[facility] ° [address], [place] (336) 992-4800   °[facility]/Dr. Osei-Bonsu ° [address] [place] [address], [place] or [address], [place] (336) 841-8500 Phone number for both [place] and [place] locations is the same.  °[facility] [address], [place] (336) 299-0000   °[facility] [address] [place] [address], [place] or [address] (336) 852-7530 °(336) 878-2260   °[facility] [address], [place] (336) 350-1642, phone; (336) 294-5005, fax Sees patients 1st and 3rd Saturday of every month.  Must not qualify for public or private insurance (i.e. Medicaid, Medicare, [place] Health Choice, Veterans' Benefits) • Household income should be no more than 200% of the poverty level •The clinic cannot treat you if you are pregnant or think you are pregnant • Sexually transmitted diseases are not treated at the clinic.  ° ° °Dental Care: °Organization         Address  Phone  Notes  °[place] [facility] [address], [place] (336) 641-6152 Accepts children up to age 21 who are enrolled in Medicaid or [place] Health Choice; pregnant women with a Medicaid card; and children who have applied for Medicaid or [place] Health Choice, but were declined, whose parents can pay a reduced fee at time of service.  °[place]  [facility] [place]  [address], [place] (336) 641-7733 Accepts children up to age 21 who are enrolled in Medicaid or [place] Health Choice; pregnant women with a Medicaid card; and children who have applied for Medicaid or [place] Health Choice, but were declined, whose parents can pay a reduced fee at time of service.  °[facility] ° [address], [place] (336) 641-4533 Patients are seen by appointment only. Walk-ins are not accepted. [facility] will see patients 18 years of age and older. °Monday - Tuesday (8am-5pm) °Most Wednesdays (8:30-5pm) °$30 per visit, cash only  °[facility] ° [address], [place] (336) 641-4533 Patients are seen by appointment only. Walk-ins are not accepted. [facility] will see patients 18 years of age and older. °One   Wednesday Evening (Monthly: Volunteer Based).  $30 per visit, cash only  °[facility]  (919) 537-3737 for adults; Children under age 4, call Graduate Pediatric Dentistry at (919) [phone]. Children aged 4-14, please call (919) 537-3737 to request a pediatric application. ° Dental services are provided in all areas of dental care including fillings, crowns and bridges, complete and partial dentures, implants, gum treatment, root canals, and extractions. Preventive care is also provided. Treatment is provided to both adults and children. °Patients are selected via a lottery and there is often a waiting list. °  °[facility] [address], °[place] ° (336) 763-8833 [email or web address] °  °[facility] [address], [place], [place] (336)723-1848, Ext. 123 Second and Fourth Thursday of each month, opens at 6:30 AM; Clinic ends at 9 AM.  Patients are seen on a first-come first-served basis, and a limited number are seen during each clinic.  ° °[facility] ° [address], [place], [place] (336) 723-7904    Eligibility Requirements °You must have lived in [place], [place], or [place] counties for at least the last three months. °  You cannot be eligible for state or federal sponsored healthcare insurance, including Veterans Administration, Medicaid, or Medicare. °  You generally cannot be eligible for healthcare insurance through your employer.  °  How to apply: °Eligibility screenings are held every Tuesday and Wednesday afternoon from 1:00 pm until 4:00 pm. You do not need an appointment for the interview!  °[facility] [address], [place], [place] 336-631-2330   °[facility]  336-342-8273   °[facility]  336-703-3100   °[facility]  336-570-6415   ° °Behavioral Health Resources in the Community: °Intensive Outpatient Programs °Organization         Address  Phone  Notes  °[facility] [address], [place], [place] 336-878-6098   °[facility] [address], [place], [place] 336-832-9800   °[facility] [address], [place], [place] ° 336-882-2125   °[facility] [address],  °[place], [place] 1-800-853-5163 or 336-641-4981   °Substance Abuse Resources °Organization         Address  Phone  Notes  °[facility]  336-882-2125   °[facility]  336-784-9470   °[facility]  336-285-9073   °[facility]  336-845-3988   °Residential & Outpatient Substance Abuse Program  1-800-659-3381   °Psychological Services °Organization         Address  Phone  Notes  °[facility]  336- 832-9600   °[facility]  336- 378-7881   °[facility] [address], [place] 1-800-853-5163 or 336-641-4981   ° °Mobile Crisis Teams °Organization         Address  Phone  Notes  °[facility], Mobile Crisis Care Unit  1-877-626-1772   °Assertive °[facility] ° [address]  [place], [place] 336-834-9664   °Sharon DeEsch [address] °[place] 336-554-5454   ° °Self-Help/Support Groups °Organization         Address  Phone             Notes  °[facility] - variety of support groups  336- 373-1402 Call for more information  °Narcotics Anonymous (NA), [facility] [address], °[place]  2 meetings at this location  ° °  Residential Treatment Programs °Organization         Address  Phone  Notes  °[facility] [address],    °[place]  1-866-801-8205   °[facility] ° [address], [place], [place] 704-293-8524   °[facility] [address], [place] 336-845-3988 Admissions: 8am-3pm M-F  °[facility] [address]    °[place], [place] 336-841-1104   °[facility] [address], [place], [place] 336-379-7146   °[facility] [address]  °[place], [place] 336-285-9073   °[facility] - Intensive Outpatient [address], [place], [place] 336-852-3033   °[facility] [address]  °[place], [place] 1-877-615-2722 or 336-784-9470   °[facility] [address] [place], [place] 336-227-7417 Accepts Medicaid  °[facility] [address]  °[place] 1-800-659-3381 Substance Abuse/Addiction Treatment  ° °[place] Behavioral Health Resources °Organization         Address  Phone  Notes  °[facility]  (888) 581-9988   °Julie Brannon, PhD [address] [place], [place]   (336) 349-5553 or (336) 951-0000   °[facility]   [address] °[place], [place] (336) 349-4454   °[facility] [address], [place], [place] (336) 342-8316 Insurance/Medicaid/sponsorship through [facility]  °[facility] [address]                                    [place], [place] (336) 342-8316 Therapy/tele-psych/case    °[facility] [address]  ° [place], [place] (336) 349-2233    °Dr. Arfeen  (336) 349-4544   °[facility] [place]  [employer] [place] [facility] 1) [address], [place] °2) [address], [place] °3)  [address], [place] (336) 349-3220 °(336) 342-7768 ° °(336) 342-8140   °[place] [facility] (336) 342-1394 or (336) 342-3537 (After Hours)    ° ° °Take the prescriptions as directed.  Apply moist heat or ice to the area(s) of discomfort, for 15 minutes at a time, several times per day for the next few days.  Do not fall asleep on a heating or ice pack.  Call your regular medical doctor today to schedule a follow up appointment in the next 2 days.  Return to the Emergency Department immediately if worsening. ° °

## 2014-08-02 ENCOUNTER — Emergency Department (HOSPITAL_COMMUNITY): Payer: Self-pay

## 2014-08-02 ENCOUNTER — Emergency Department (HOSPITAL_COMMUNITY)
Admission: EM | Admit: 2014-08-02 | Discharge: 2014-08-02 | Disposition: A | Discharge status: Home / Self Care | Payer: Self-pay | Attending: Emergency Medicine | Admitting: Emergency Medicine

## 2014-08-02 ENCOUNTER — Encounter (HOSPITAL_COMMUNITY): Payer: Self-pay | Admitting: *Deleted

## 2014-08-02 DIAGNOSIS — Z8659 Personal history of other mental and behavioral disorders: Secondary | ICD-10-CM | POA: Insufficient documentation

## 2014-08-02 DIAGNOSIS — W102XXA Fall (on)(from) incline, initial encounter: Secondary | ICD-10-CM

## 2014-08-02 DIAGNOSIS — Y9289 Other specified places as the place of occurrence of the external cause: Secondary | ICD-10-CM | POA: Insufficient documentation

## 2014-08-02 DIAGNOSIS — W11XXXA Fall on and from ladder, initial encounter: Secondary | ICD-10-CM | POA: Insufficient documentation

## 2014-08-02 DIAGNOSIS — S3992XA Unspecified injury of lower back, initial encounter: Secondary | ICD-10-CM | POA: Insufficient documentation

## 2014-08-02 DIAGNOSIS — G43909 Migraine, unspecified, not intractable, without status migrainosus: Secondary | ICD-10-CM | POA: Insufficient documentation

## 2014-08-02 DIAGNOSIS — Y9389 Activity, other specified: Secondary | ICD-10-CM | POA: Insufficient documentation

## 2014-08-02 DIAGNOSIS — M549 Dorsalgia, unspecified: Secondary | ICD-10-CM

## 2014-08-02 DIAGNOSIS — Z87891 Personal history of nicotine dependence: Secondary | ICD-10-CM | POA: Insufficient documentation

## 2014-08-02 DIAGNOSIS — Z9889 Other specified postprocedural states: Secondary | ICD-10-CM | POA: Insufficient documentation

## 2014-08-02 DIAGNOSIS — G8929 Other chronic pain: Secondary | ICD-10-CM | POA: Insufficient documentation

## 2014-08-02 DIAGNOSIS — Y998 Other external cause status: Secondary | ICD-10-CM | POA: Insufficient documentation

## 2014-08-02 MED ORDER — OXYCODONE-ACETAMINOPHEN 5-325 MG PO TABS
2.0000 | ORAL_TABLET | Freq: Once | ORAL | Status: AC
  Administered 2014-08-02: 2 via ORAL
  Filled 2014-08-02: qty 2

## 2014-08-02 MED ORDER — OXYCODONE-ACETAMINOPHEN 5-325 MG PO TABS: 1.0000 | ORAL_TABLET | ORAL | Status: DC | PRN

## 2014-08-02 NOTE — ED Provider Notes (Signed)
CSN: [id number]     Arrival date & time 08/02/14  1001 History   First MD Initiated Contact with Patient 08/02/14 1017     Chief Complaint  Patient presents with  . Fall  . Back Pain     (Consider location/radiation/quality/duration/timing/severity/associated sxs/prior Treatment) Patient is a 38 y.o. male presenting with fall and back pain. The history is provided by the patient and medical records.  Fall  Back Pain   This is a 38 y.o. M with PMH significant for migraine headaches, depression, seizure disorder, chronic back pain, paresthesias/weakness of left arm, presenting to the ED for back pain.  Patient reports he was approx 2 steps up on a ladder yesterday hanging christmas lights outside when he lost his balance and fell off, landing directly onto his buttocks.  He denies any head trauma or loss of consciousness. States since this time he has had increased back pain. He does have persistent paresthesias and mild weakness of his left leg which is unchanged from baseline. He has been ambulatory without difficulty. He denies any loss of bowel or bladder control. Patient does have history of back surgery in 2008 by Dr. Tonita Cong, no complications with this surgery.  No reported fever, chills, sweats.  Has been taking OTC meds without noted improvement.  VS stable on arrival.  Past Medical History  Diagnosis Date  . Migraines   . Depression   . Seizures   . Chronic back pain   . Chronic neck pain   . Paresthesia of left arm and leg     and weakness of left arm and leg  . Chronic left shoulder pain    Past Surgical History  Procedure Laterality Date  . Knee surgery    . Back surgery     Family History  Problem Relation Age of Onset  . Diabetes Father    History  Substance Use Topics  . Smoking status: Former Smoker    Types: Cigarettes  . Smokeless tobacco: Never Used  . Alcohol Use: No     Comment: rarely    Review of Systems  Musculoskeletal: Positive for back pain.   All other systems reviewed and are negative.     Allergies  Review of patient's allergies indicates no known allergies.  Home Medications   Prior to Admission medications   Medication Sig Start Date End Date Taking? Authorizing Provider  acetaminophen (TYLENOL) 500 MG tablet Take 1,500-2,000 mg by mouth every 4 (four) hours as needed for mild pain. For migraine    Historical Provider, MD  methocarbamol (ROBAXIN) 500 MG tablet Take 2 tablets (1,000 mg total) by mouth 4 (four) times daily as needed for muscle spasms (muscle spasm/pain). 06/22/14   Francine Graven, DO  naproxen (NAPROSYN) 250 MG tablet Take 1 tablet (250 mg total) by mouth 2 (two) times daily as needed for mild pain or moderate pain (take with food). 06/22/14   Francine Graven, DO  oxyCODONE-acetaminophen (PERCOCET/ROXICET) 5-325 MG per tablet 1 or 2 tabs PO q6h prn pain 06/22/14   Francine Graven, DO   BP 130/73 mmHg  Pulse 98  Temp(Src) 98.3 F (36.8 C) (Oral)  Resp 20  SpO2 99%   Physical Exam  Constitutional: He is oriented to person, place, and time. He appears well-developed and well-nourished. No distress.  HENT:  Head: Normocephalic and atraumatic.  Mouth/Throat: Oropharynx is clear and moist.  Eyes: Conjunctivae and EOM are normal. Pupils are equal, round, and reactive to light.  Neck: Normal range of  motion. Neck supple.  Cardiovascular: Normal rate, regular rhythm and normal heart sounds.   Pulmonary/Chest: Effort normal and breath sounds normal. No respiratory distress. He has no wheezes.  Abdominal: Soft. Bowel sounds are normal. There is no tenderness. There is no guarding.  Musculoskeletal: Normal range of motion.  Well-healed midline lumbar surgical incision, diffuse tenderness of lumbar spine, worse at L4-L5 region, no step-off or deformities noted, full range of motion maintained, normal strength and sensation of bilateral lower extremities, steady gait  Neurological: He is alert and oriented  to person, place, and time.  Skin: Skin is warm and dry. He is not diaphoretic.  Psychiatric: He has a normal mood and affect.  Nursing note and vitals reviewed.   ED Course  Procedures (including critical care time) Labs Review Labs Reviewed - No data to display  Imaging Review Dg Lumbar Spine Complete  08/02/2014   CLINICAL DATA:  Low back pain, fall from ladder  EXAM: LUMBAR SPINE - COMPLETE 4+ VIEW  COMPARISON:  12/20/2013  FINDINGS: Five lumbar type vertebral bodies.  Normal lumbar lordosis.  No evidence of fracture dislocation. Vertebral body heights and intervertebral disc spaces are maintained.  Visualized bony pelvis appears intact.  IMPRESSION: Normal lumbar spine radiographs.   Electronically Signed   By: Julian Hy M.D.   On: 08/02/2014 11:55     EKG Interpretation None      MDM   Final diagnoses:  Fall (on)(from) incline, initial encounter  Back pain, unspecified location   There is 11-year-old male with increased back pain after fall from a ladder. No head injury or loss of consciousness. On exam, patient has diffuse pain of his lumbar spine without noted deformities. No red-like symptoms or focal neurologic deficits. Imaging obtained given his prior back surgery, negative for acute fracture or subluxation. Patient given Percocet in the ED with improvement of his pain, will discharge home with the same. Encouraged close follow-up with PCP.  Discussed plan with patient, he/she acknowledged understanding and agreed with plan of care.  Return precautions given for new or worsening symptoms.  Larene Pickett, PA-C 08/02/14 [place], MD 08/02/14 [phone]

## 2014-08-02 NOTE — ED Notes (Signed)
Declined W/C at D/C and was escorted to lobby by RN. 

## 2014-08-02 NOTE — Discharge Instructions (Signed)
Take the prescribed medication as directed.  Use caution when driving. Follow-up with your primary care physician. Return to the ED for new or worsening symptoms.

## 2014-08-02 NOTE — ED Notes (Signed)
Pt reports falling approx 2 ft off a ladder yesterday and landed on his bottom, now having lower back pain but has hx of back surgery. Pt ambulatory, denies any incontinence.

## 2014-12-21 ENCOUNTER — Emergency Department (HOSPITAL_COMMUNITY): Payer: Self-pay

## 2014-12-21 ENCOUNTER — Emergency Department (HOSPITAL_COMMUNITY)
Admission: EM | Admit: 2014-12-21 | Discharge: 2014-12-21 | Disposition: A | Discharge status: Home / Self Care | Payer: Self-pay | Attending: Emergency Medicine | Admitting: Emergency Medicine

## 2014-12-21 ENCOUNTER — Encounter (HOSPITAL_COMMUNITY): Payer: Self-pay | Admitting: Emergency Medicine

## 2014-12-21 DIAGNOSIS — W1839XA Other fall on same level, initial encounter: Secondary | ICD-10-CM | POA: Insufficient documentation

## 2014-12-21 DIAGNOSIS — S3992XA Unspecified injury of lower back, initial encounter: Secondary | ICD-10-CM | POA: Insufficient documentation

## 2014-12-21 DIAGNOSIS — Y9389 Activity, other specified: Secondary | ICD-10-CM | POA: Insufficient documentation

## 2014-12-21 DIAGNOSIS — Y9289 Other specified places as the place of occurrence of the external cause: Secondary | ICD-10-CM | POA: Insufficient documentation

## 2014-12-21 DIAGNOSIS — Z8669 Personal history of other diseases of the nervous system and sense organs: Secondary | ICD-10-CM | POA: Insufficient documentation

## 2014-12-21 DIAGNOSIS — G8929 Other chronic pain: Secondary | ICD-10-CM | POA: Insufficient documentation

## 2014-12-21 DIAGNOSIS — Z8679 Personal history of other diseases of the circulatory system: Secondary | ICD-10-CM | POA: Insufficient documentation

## 2014-12-21 DIAGNOSIS — S79912A Unspecified injury of left hip, initial encounter: Secondary | ICD-10-CM | POA: Insufficient documentation

## 2014-12-21 DIAGNOSIS — Y998 Other external cause status: Secondary | ICD-10-CM | POA: Insufficient documentation

## 2014-12-21 DIAGNOSIS — Z87891 Personal history of nicotine dependence: Secondary | ICD-10-CM | POA: Insufficient documentation

## 2014-12-21 DIAGNOSIS — W19XXXA Unspecified fall, initial encounter: Secondary | ICD-10-CM

## 2014-12-21 DIAGNOSIS — Z8659 Personal history of other mental and behavioral disorders: Secondary | ICD-10-CM | POA: Insufficient documentation

## 2014-12-21 MED ORDER — HYDROCODONE-ACETAMINOPHEN 5-325 MG PO TABS: 1.0000 | ORAL_TABLET | Freq: Four times a day (QID) | ORAL | Status: DC | PRN

## 2014-12-21 MED ORDER — DIAZEPAM 5 MG PO TABS
5.0000 mg | ORAL_TABLET | Freq: Once | ORAL | Status: AC
  Administered 2014-12-21: 5 mg via ORAL
  Filled 2014-12-21: qty 1

## 2014-12-21 MED ORDER — KETOROLAC TROMETHAMINE 30 MG/ML IJ SOLN
30.0000 mg | Freq: Once | INTRAMUSCULAR | Status: AC
  Administered 2014-12-21: 30 mg via INTRAMUSCULAR
  Filled 2014-12-21: qty 1

## 2014-12-21 MED ORDER — HYDROCODONE-ACETAMINOPHEN 5-325 MG PO TABS
1.0000 | ORAL_TABLET | Freq: Once | ORAL | Status: AC
  Administered 2014-12-21: 1 via ORAL
  Filled 2014-12-21: qty 1

## 2014-12-21 MED ORDER — DIAZEPAM 5 MG PO TABS: 5.0000 mg | ORAL_TABLET | Freq: Two times a day (BID) | ORAL | Status: AC

## 2014-12-21 MED ORDER — IBUPROFEN 200 MG PO TABS: 600.0000 mg | ORAL_TABLET | Freq: Three times a day (TID) | ORAL | Status: AC

## 2014-12-21 NOTE — ED Notes (Signed)
Patient was educated not to drive, operate heavy machinery, or drink alcohol while taking narcotic medication.  

## 2014-12-21 NOTE — Progress Notes (Signed)
CM spoke with pt who confirms self pay [facility] resident with no pcp. CM discussed and provided written information for self pay pcps, importance of pcp for f/u care, www.needymeds.org, www.goodrx.com, discounted pharmacies and other [employer] such as [employer], affordable care act,  financial assistance, DSS and  health department  Reviewed resources for [employer] self pay pcps like [facility], family medicine at [address], [facility] family practice, general medical clinics, [facility] urgent care plus others, medication resources, [facility] out patient pharmacies and housing Pt voiced understanding and appreciation of resources provided   Provided [facility] contact information Pt agreed to referral. CM sent referral Pt states he does work out of town, requesting [facility] staff leave best time he can call back to set up appt

## 2014-12-21 NOTE — ED Notes (Signed)
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 Pt states this happened Friday before last and been having pain since that radiates to left lower back area.

## 2014-12-21 NOTE — ED Provider Notes (Signed)
CSN: [id number]     Arrival date & time 12/21/14  0160 History   First MD Initiated Contact with Patient 12/21/14 [phone]     Chief Complaint  Patient presents with  . Hip Pain  . Back Pain     HPI  Patient presents several days after a fall with persistent pain. The pain is focally in the left lateral hip, left paraspinal/parasacral area. Pain is sore, severe, worse with ambulation or weightbearing. Pain is minimally improved with ibuprofen or Tylenol. Prior to the fall the patient was well. He acknowledged a history of prior L5 procedure, but cannot specify which. He denies abdominal pain, incontinence, fever, chills, new dysesthesia in the left leg. He has baseline dysesthesia in the entirety of the left leg.   Past Medical History  Diagnosis Date  . Migraines   . Depression   . Seizures   . Chronic back pain   . Chronic neck pain   . Paresthesia of left arm and leg     and weakness of left arm and leg  . Chronic left shoulder pain    Past Surgical History  Procedure Laterality Date  . Knee surgery    . Back surgery     Family History  Problem Relation Age of Onset  . Diabetes Father    History  Substance Use Topics  . Smoking status: Former Smoker    Types: Cigarettes  . Smokeless tobacco: Never Used  . Alcohol Use: No     Comment: rarely    Review of Systems  Constitutional:       Per HPI, otherwise negative  HENT:       Per HPI, otherwise negative  Respiratory:       Per HPI, otherwise negative  Cardiovascular:       Per HPI, otherwise negative  Gastrointestinal: Negative for vomiting.  Endocrine:       Negative aside from HPI  Genitourinary:       Neg aside from HPI   Musculoskeletal:       Per HPI, otherwise negative  Skin: Negative.   Neurological: Negative for syncope.      Allergies  Review of patient's allergies indicates no known allergies.  Home Medications   Prior to Admission medications   Medication Sig Start Date End Date  Taking? Authorizing Provider  acetaminophen (TYLENOL) 500 MG tablet Take 1,500-2,000 mg by mouth every 4 (four) hours as needed for mild pain.   Yes Historical Provider, MD  ibuprofen (ADVIL,MOTRIN) 200 MG tablet Take 400-600 mg by mouth every 4 (four) hours as needed for fever or moderate pain.   Yes Historical Provider, MD  methocarbamol (ROBAXIN) 500 MG tablet Take 2 tablets (1,000 mg total) by mouth 4 (four) times daily as needed for muscle spasms (muscle spasm/pain). Patient not taking: Reported on 12/21/2014 06/22/14   Francine Graven, DO  naproxen (NAPROSYN) 250 MG tablet Take 1 tablet (250 mg total) by mouth 2 (two) times daily as needed for mild pain or moderate pain (take with food). Patient not taking: Reported on 12/21/2014 06/22/14   Francine Graven, DO  oxyCODONE-acetaminophen (PERCOCET/ROXICET) 5-325 MG per tablet Take 1 tablet by mouth every 4 (four) hours as needed. Patient not taking: Reported on 12/21/2014 08/02/14   Larene Pickett, PA-C   BP 141/81 mmHg  Pulse 68  Temp(Src) 98 F (36.7 C) (Oral)  Resp 16  Ht 6' (1.829 m)  Wt 188 lb (85.276 kg)  BMI 25.49 kg/m2  SpO2 100%  Physical Exam  Constitutional: He is oriented to person, place, and time. He appears well-developed. No distress.  HENT:  Head: Normocephalic and atraumatic.  Eyes: Conjunctivae and EOM are normal.  Cardiovascular: Normal rate and regular rhythm.   Pulmonary/Chest: Effort normal. No stridor. No respiratory distress.  Abdominal: He exhibits no distension.  Musculoskeletal: He exhibits no edema.       Right hip: Normal.       Left hip: He exhibits tenderness and bony tenderness. He exhibits normal range of motion, normal strength, no swelling, no crepitus, no deformity and no laceration.       Left knee: Normal.       Back:  Neurological: He is alert and oriented to person, place, and time.  Skin: Skin is warm and dry.  Psychiatric: He has a normal mood and affect.  Nursing note and vitals  reviewed.   ED Course  Procedures (including critical care time)  Imaging Review Dg Lumbar Spine Complete  12/21/2014   CLINICAL DATA:  Left-sided low back pain and left hip pain after a fall last week.  EXAM: LUMBAR SPINE - COMPLETE 4+ VIEW  COMPARISON:  08/02/2014  FINDINGS: There is no fracture or subluxation or facet arthritis. With slight narrowing of the L5-S1 disc space, slightly increased since 08/02/2014.  IMPRESSION: Degenerative disc disease at L5-S1.  No acute abnormalities.   Electronically Signed   By: Lorriane Shire M.D.   On: 12/21/2014 10:09   Dg Hip Unilat With Pelvis 2-3 Views Left  12/21/2014   CLINICAL DATA:  Left hip and low back pain since a fall last week.  EXAM: LEFT HIP (WITH PELVIS) 2-3 VIEWS  COMPARISON:  None.  FINDINGS: There is no evidence of hip fracture or dislocation. There is no evidence of arthropathy or other focal bone abnormality.  IMPRESSION: Normal exam.   Electronically Signed   By: Lorriane Shire M.D.   On: 12/21/2014 10:07    I reviewed the results (including imaging as performed), agree with the interpretation  On repeat exam the patient appears better.  We reviewed all findings.  MDM   Final diagnoses:  Fall    Patient presents several days after fall with ongoing pain. Patient is ambulatory, with no evidence for neurovascular compromise, nor systemic illness. Patient started on a course of analgesics, will follow up with orthopedics.     Carmin Muskrat, MD 12/21/14 [phone]

## 2014-12-21 NOTE — Discharge Instructions (Signed)
As discussed, it is normal to feel worse in the days immediately following a fall regardless of medication use.  However, please take all medication as directed, use ice packs liberally.  If you develop any new, or concerning changes in your condition, please return here for further evaluation and management.    Otherwise, please return followup with your physician, and our orthopedist.

## 2015-02-06 ENCOUNTER — Encounter (HOSPITAL_COMMUNITY): Payer: Self-pay

## 2015-02-06 ENCOUNTER — Emergency Department (HOSPITAL_COMMUNITY)
Admission: EM | Admit: 2015-02-06 | Discharge: 2015-02-06 | Disposition: A | Discharge status: Home / Self Care | Payer: Self-pay | Attending: Emergency Medicine | Admitting: Emergency Medicine

## 2015-02-06 DIAGNOSIS — Y9389 Activity, other specified: Secondary | ICD-10-CM | POA: Insufficient documentation

## 2015-02-06 DIAGNOSIS — T148XXA Other injury of unspecified body region, initial encounter: Secondary | ICD-10-CM

## 2015-02-06 DIAGNOSIS — G8929 Other chronic pain: Secondary | ICD-10-CM | POA: Insufficient documentation

## 2015-02-06 DIAGNOSIS — T148 Other injury of unspecified body region: Secondary | ICD-10-CM | POA: Insufficient documentation

## 2015-02-06 DIAGNOSIS — S199XXA Unspecified injury of neck, initial encounter: Secondary | ICD-10-CM | POA: Insufficient documentation

## 2015-02-06 DIAGNOSIS — Z8659 Personal history of other mental and behavioral disorders: Secondary | ICD-10-CM | POA: Insufficient documentation

## 2015-02-06 DIAGNOSIS — Z87891 Personal history of nicotine dependence: Secondary | ICD-10-CM | POA: Insufficient documentation

## 2015-02-06 DIAGNOSIS — Z8679 Personal history of other diseases of the circulatory system: Secondary | ICD-10-CM | POA: Insufficient documentation

## 2015-02-06 DIAGNOSIS — Y998 Other external cause status: Secondary | ICD-10-CM | POA: Insufficient documentation

## 2015-02-06 DIAGNOSIS — Y92410 Unspecified street and highway as the place of occurrence of the external cause: Secondary | ICD-10-CM | POA: Insufficient documentation

## 2015-02-06 DIAGNOSIS — M5416 Radiculopathy, lumbar region: Secondary | ICD-10-CM | POA: Insufficient documentation

## 2015-02-06 MED ORDER — NAPROXEN 500 MG PO TABS: 500.0000 mg | ORAL_TABLET | Freq: Two times a day (BID) | ORAL | Status: DC

## 2015-02-06 MED ORDER — HYDROCODONE-ACETAMINOPHEN 5-325 MG PO TABS
1.0000 | ORAL_TABLET | Freq: Once | ORAL | Status: AC
  Administered 2015-02-06: 1 via ORAL
  Filled 2015-02-06: qty 1

## 2015-02-06 MED ORDER — METHOCARBAMOL 500 MG PO TABS: 1000.0000 mg | ORAL_TABLET | Freq: Four times a day (QID) | ORAL | Status: DC

## 2015-02-06 MED ORDER — HYDROCODONE-ACETAMINOPHEN 5-325 MG PO TABS: ORAL_TABLET | ORAL | Status: DC
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
## 2015-02-06 NOTE — ED Notes (Signed)
Declined W/C at D/C and was escorted to lobby by RN. 
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
## 2015-03-02 ENCOUNTER — Encounter (HOSPITAL_COMMUNITY): Payer: Self-pay | Admitting: Emergency Medicine

## 2015-03-02 ENCOUNTER — Emergency Department (HOSPITAL_COMMUNITY): Payer: Self-pay

## 2015-03-02 ENCOUNTER — Emergency Department (HOSPITAL_COMMUNITY)
Admission: EM | Admit: 2015-03-02 | Discharge: 2015-03-02 | Disposition: A | Discharge status: Home / Self Care | Payer: Self-pay | Attending: Emergency Medicine | Admitting: Emergency Medicine

## 2015-03-02 DIAGNOSIS — M778 Other enthesopathies, not elsewhere classified: Secondary | ICD-10-CM | POA: Insufficient documentation

## 2015-03-02 DIAGNOSIS — Z8679 Personal history of other diseases of the circulatory system: Secondary | ICD-10-CM | POA: Insufficient documentation

## 2015-03-02 DIAGNOSIS — Z87891 Personal history of nicotine dependence: Secondary | ICD-10-CM | POA: Insufficient documentation

## 2015-03-02 DIAGNOSIS — Z791 Long term (current) use of non-steroidal anti-inflammatories (NSAID): Secondary | ICD-10-CM | POA: Insufficient documentation

## 2015-03-02 DIAGNOSIS — M25521 Pain in right elbow: Secondary | ICD-10-CM

## 2015-03-02 DIAGNOSIS — R200 Anesthesia of skin: Secondary | ICD-10-CM | POA: Insufficient documentation

## 2015-03-02 DIAGNOSIS — Z8659 Personal history of other mental and behavioral disorders: Secondary | ICD-10-CM | POA: Insufficient documentation

## 2015-03-02 DIAGNOSIS — G8929 Other chronic pain: Secondary | ICD-10-CM | POA: Insufficient documentation

## 2015-03-02 DIAGNOSIS — S59901A Unspecified injury of right elbow, initial encounter: Secondary | ICD-10-CM | POA: Insufficient documentation

## 2015-03-02 DIAGNOSIS — M779 Enthesopathy, unspecified: Secondary | ICD-10-CM

## 2015-03-02 DIAGNOSIS — Y998 Other external cause status: Secondary | ICD-10-CM | POA: Insufficient documentation

## 2015-03-02 DIAGNOSIS — X58XXXA Exposure to other specified factors, initial encounter: Secondary | ICD-10-CM | POA: Insufficient documentation

## 2015-03-02 DIAGNOSIS — Z79899 Other long term (current) drug therapy: Secondary | ICD-10-CM | POA: Insufficient documentation

## 2015-03-02 DIAGNOSIS — Y9289 Other specified places as the place of occurrence of the external cause: Secondary | ICD-10-CM | POA: Insufficient documentation

## 2015-03-02 DIAGNOSIS — Y93F2 Activity, caregiving, lifting: Secondary | ICD-10-CM | POA: Insufficient documentation

## 2015-03-02 MED ORDER — MELOXICAM 7.5 MG PO TABS: 7.5000 mg | ORAL_TABLET | Freq: Every day | ORAL | Status: DC | PRN

## 2015-03-02 NOTE — ED Provider Notes (Signed)
CSN: [id number]     Arrival date & time 03/02/15  2482 History  This chart was scribed for non-physician practitioner, Clayton Bibles, PA-C, working with Evelina Bucy, MD by Terressa Koyanagi, ED Scribe. This patient was seen in room TR07C/TR07C and the patient's care was started at 10:32 AM. Chief Complaint  Patient presents with  . Elbow Pain   The history is provided by the patient. No language interpreter was used.   PCP: No PCP Per Patient HPI Comments: Micheal Roth is a 39 y.o. male, with PMH noted below including injury to his right elbow 1 year ago resulting in chronic, intermittent dull right elbow pain with increased physical activity, who presents to the Emergency Department complaining of traumatic, chronic, 10/10, right elbow pain worsening over the past 2 days ago after pt lifted a 20lbs box and heard a "pop". Pt also complains of associated tingling of right digits 2-5. Pt reports taking 400-600mg  of ibuprofen and tylenol at home with minimal relief. Pt states the pain is worse when he picks up heavy objects. Pt further reports his occupation requires repetitive arm movements. Pt denies any Sx associated with the left arm. Pt denies f/u with ortho for his Sx.   Denies fevers, weakness or numbness of the arm, CP, SOB.  Denies other joint pain.   Past Medical History  Diagnosis Date  . Migraines   . Depression   . Seizures   . Chronic back pain   . Chronic neck pain   . Paresthesia of left arm and leg     and weakness of left arm and leg  . Chronic left shoulder pain    Past Surgical History  Procedure Laterality Date  . Knee surgery    . Back surgery     Family History  Problem Relation Age of Onset  . Diabetes Father    History  Substance Use Topics  . Smoking status: Former Smoker    Types: Cigarettes  . Smokeless tobacco: Never Used  . Alcohol Use: No    Review of Systems  Constitutional: Negative for fever and chills.  Respiratory: Negative for shortness of  breath.   Cardiovascular: Negative for chest pain.  Musculoskeletal:       Right elbow pain   Skin: Negative for color change and pallor.  Allergic/Immunologic: Negative for immunocompromised state.  Neurological: Positive for numbness (right digits 2-5). Negative for weakness.  Hematological: Does not bruise/bleed easily.  Psychiatric/Behavioral: Negative for self-injury.   Allergies  Review of patient's allergies indicates no known allergies.  Home Medications   Prior to Admission medications   Medication Sig Start Date End Date Taking? Authorizing Provider  HYDROcodone-acetaminophen (NORCO/VICODIN) 5-325 MG per tablet Take 1-2 tablets every 6 hours as needed for severe pain 02/06/15   Carlisle Cater, PA-C  methocarbamol (ROBAXIN) 500 MG tablet Take 2 tablets (1,000 mg total) by mouth 4 (four) times daily. 02/06/15   Carlisle Cater, PA-C  naproxen (NAPROSYN) 500 MG tablet Take 1 tablet (500 mg total) by mouth 2 (two) times daily. 02/06/15   Carlisle Cater, PA-C   Triage Vitals: BP 162/94 mmHg  Pulse 67  Temp(Src) 98.2 F (36.8 C) (Oral)  Resp 16  SpO2 99% Physical Exam  Constitutional: He appears well-developed and well-nourished. No distress.  HENT:  Head: Normocephalic and atraumatic.  Neck: Neck supple.  Pulmonary/Chest: Effort normal.  Musculoskeletal:       Right elbow: Tenderness (over lateral aspect of elbow, mild tenderness and swelling of proximal  forearms) found.  Right Elbow: No bony tenderness. Distal pulses intact, grip strength slightly decreased secondary to pain, cap refill less than 2 seconds, no other tenderness noted, pt reports tingling to the 2-5th fingertip  Neurological: He is alert.  Skin: He is not diaphoretic.  Nursing note and vitals reviewed.   ED Course  Procedures (including critical care time) DIAGNOSTIC STUDIES: Oxygen Saturation is 99% on RA, nl by my interpretation.    COORDINATION OF CARE: 10:37 AM-Discussed treatment plan which includes  RICE, hand specialist referral and meds with pt at bedside and pt agreed to plan.   Labs Review Labs Reviewed - No data to display  Imaging Review Dg Elbow Complete Right  03/02/2015   CLINICAL DATA:  Right elbow pain following lifting heavy object, initial encounter  EXAM: RIGHT ELBOW - COMPLETE 3+ VIEW  COMPARISON:  04/28/2014  FINDINGS: There is no evidence of fracture, dislocation, or joint effusion. There is no evidence of arthropathy or other focal bone abnormality. Soft tissues are unremarkable.  IMPRESSION: No acute abnormality noted.   Electronically Signed   By: Inez Catalina M.D.   On: 03/02/2015 10:25     EKG Interpretation None         MDM   Final diagnoses:  Right elbow pain  Tendonitis    Afebrile, nontoxic patient with injury to his right elbow with repetitive activity and heavy liftin.   Xray negative.  Neurovascularly intact.  Suspect tendonitis.   D/C home with mobic, hand surgery referral.   Discussed result, findings, treatment, and follow up  with patient.  Pt given return precautions.  Pt verbalizes understanding and agrees with plan.      I personally performed the services described in this documentation, which was scribed in my presence. The recorded information has been reviewed and is accurate.    Clayton Bibles, PA-C 03/02/15 1643  Evelina Bucy, MD 03/03/15 [phone]

## 2015-03-02 NOTE — ED Notes (Signed)
Pt c/o right elbow pain after "a pop" 2 days ago while lifting a 20 lb. Box. Hx of problems with right elbow 1 year ago. Pain worse with immobility of elbow.

## 2015-03-02 NOTE — ED Notes (Signed)
Patient returned from xray.

## 2015-03-02 NOTE — Discharge Instructions (Signed)
Read the information below.  Use the prescribed medication as directed.  Please discuss all new medications with your pharmacist.  You may return to the Emergency Department at any time for worsening condition or any new symptoms that concern you.   If you develop uncontrolled pain, weakness or numbness of the extremity, severe discoloration of the skin, or you are unable to use your right arm or move your right elbow, return to the ER for a recheck.

## 2016-01-26 ENCOUNTER — Encounter (HOSPITAL_COMMUNITY): Payer: Self-pay

## 2016-01-26 ENCOUNTER — Emergency Department (HOSPITAL_COMMUNITY)
Admission: EM | Admit: 2016-01-26 | Discharge: 2016-01-26 | Disposition: A | Discharge status: Home / Self Care | Payer: No Typology Code available for payment source | Attending: Emergency Medicine | Admitting: Emergency Medicine

## 2016-01-26 ENCOUNTER — Emergency Department (HOSPITAL_COMMUNITY): Payer: No Typology Code available for payment source

## 2016-01-26 DIAGNOSIS — Z87891 Personal history of nicotine dependence: Secondary | ICD-10-CM | POA: Insufficient documentation

## 2016-01-26 DIAGNOSIS — Y929 Unspecified place or not applicable: Secondary | ICD-10-CM | POA: Insufficient documentation

## 2016-01-26 DIAGNOSIS — Y939 Activity, unspecified: Secondary | ICD-10-CM | POA: Insufficient documentation

## 2016-01-26 DIAGNOSIS — F329 Major depressive disorder, single episode, unspecified: Secondary | ICD-10-CM | POA: Insufficient documentation

## 2016-01-26 DIAGNOSIS — S6992XA Unspecified injury of left wrist, hand and finger(s), initial encounter: Secondary | ICD-10-CM | POA: Insufficient documentation

## 2016-01-26 DIAGNOSIS — W230XXA Caught, crushed, jammed, or pinched between moving objects, initial encounter: Secondary | ICD-10-CM | POA: Insufficient documentation

## 2016-01-26 DIAGNOSIS — Z79899 Other long term (current) drug therapy: Secondary | ICD-10-CM | POA: Insufficient documentation

## 2016-01-26 DIAGNOSIS — Y990 Civilian activity done for income or pay: Secondary | ICD-10-CM | POA: Insufficient documentation

## 2016-01-26 NOTE — ED Notes (Signed)
Patient states he got his left hand slammed in an HVAC door 2 days ago. Patient has swelling and numbness of second , thrid, and fourth finger.

## 2016-01-26 NOTE — ED Provider Notes (Signed)
CSN: [id number]     Arrival date & time 01/26/16  0908 History   First MD Initiated Contact with Patient 01/26/16 [phone]     Chief Complaint  Patient presents with  . Hand Injury    Patient is a 40 y.o. male presenting with hand injury.  Hand Injury Patient presents with left hand injury. 2 days ago he was working in a [employer] and the door shut on his left hand. Continues to have pain in the hand. States the fingers are numb. Pain is continued. No other injury. He is right-handed.  Past Medical History  Diagnosis Date  . Migraines   . Depression   . Seizures ([place])   . Chronic back pain   . Chronic neck pain   . Paresthesia of left arm and leg     and weakness of left arm and leg  . Chronic left shoulder pain    Past Surgical History  Procedure Laterality Date  . Knee surgery    . Back surgery     Family History  Problem Relation Age of Onset  . Diabetes Father    Social History  Substance Use Topics  . Smoking status: Former Smoker    Types: Cigarettes  . Smokeless tobacco: Never Used  . Alcohol Use: No    Review of Systems  Constitutional: Negative for appetite change.  Gastrointestinal: Negative for abdominal pain.  Skin: Negative for wound.  Neurological: Positive for numbness. Negative for weakness.      Allergies  Review of patient's allergies indicates no known allergies.  Home Medications   Prior to Admission medications   Medication Sig Start Date End Date Taking? Authorizing Provider  acetaminophen (TYLENOL) 500 MG tablet Take 2,000 mg by mouth daily as needed for headache.    Yes Historical Provider, MD  ibuprofen (ADVIL,MOTRIN) 200 MG tablet Take 800 mg by mouth every 6 (six) hours as needed for moderate pain.   Yes Historical Provider, MD  HYDROcodone-acetaminophen (NORCO/VICODIN) 5-325 MG per tablet Take 1-2 tablets every 6 hours as needed for severe pain Patient not taking: Reported on 01/26/2016 02/06/15   Carlisle Cater, PA-C  meloxicam  (MOBIC) 7.5 MG tablet Take 1 tablet (7.5 mg total) by mouth daily as needed for pain. Patient not taking: Reported on 01/26/2016 03/02/15   Clayton Bibles, PA-C  methocarbamol (ROBAXIN) 500 MG tablet Take 2 tablets (1,000 mg total) by mouth 4 (four) times daily. Patient not taking: Reported on 01/26/2016 02/06/15   Carlisle Cater, PA-C   BP 137/81 mmHg  Pulse 67  Temp(Src) 98.2 F (36.8 C) (Oral)  Resp 16  Ht 6' (1.829 m)  Wt 200 lb (90.719 kg)  BMI 27.12 kg/m2  SpO2 100% Physical Exam  Constitutional: He appears well-developed.  HENT:  Head: Atraumatic.  Musculoskeletal: He exhibits tenderness.  Tenderness over IP joint area of fingers of left hand. Decreased sensation over distal phalanx of fourth and third finger. Small ecchymosis under the nail of left fourth finger. Good range of motion of fingers. Skin intact.  Skin: Skin is warm.    ED Course  Procedures (including critical care time) Labs Review Labs Reviewed - No data to display  Imaging Review Dg Hand Complete Left  01/26/2016  CLINICAL DATA:  Finger pain, crush injury on Monday, persistent pain middle and pinky finger EXAM: LEFT HAND - COMPLETE 3+ VIEW COMPARISON:  None. FINDINGS: Three views of the left hand submitted. No acute fracture or subluxation. No radiopaque foreign body. No periosteal  reaction or bony erosion. IMPRESSION: Negative. Electronically Signed   By: Lahoma Crocker M.D.   On: 01/26/2016 09:53   I have personally reviewed and evaluated these images and lab results as part of my medical decision-making.   EKG Interpretation None      MDM   Final diagnoses:  Hand injury, left, initial encounter   Patient with hand contusion. Negative x-ray. Asked for work note. Will discharge home.   Davonna Belling, MD 01/26/16 1051

## 2016-01-26 NOTE — ED Notes (Signed)
Patient transported to X-ray 

## 2016-03-28 ENCOUNTER — Emergency Department (HOSPITAL_COMMUNITY)
Admission: EM | Admit: 2016-03-28 | Discharge: 2016-03-28 | Disposition: A | Discharge status: Home / Self Care | Payer: Self-pay | Attending: Emergency Medicine | Admitting: Emergency Medicine

## 2016-03-28 ENCOUNTER — Encounter (HOSPITAL_COMMUNITY): Payer: Self-pay | Admitting: Vascular Surgery

## 2016-03-28 DIAGNOSIS — B029 Zoster without complications: Secondary | ICD-10-CM | POA: Insufficient documentation

## 2016-03-28 DIAGNOSIS — Z87891 Personal history of nicotine dependence: Secondary | ICD-10-CM | POA: Insufficient documentation

## 2016-03-28 MED ORDER — PREDNISONE 20 MG PO TABS: 40.0000 mg | ORAL_TABLET | Freq: Every day | ORAL | 0 refills | Status: DC

## 2016-03-28 MED ORDER — HYDROCODONE-ACETAMINOPHEN 5-325 MG PO TABS: 2.0000 | ORAL_TABLET | Freq: Four times a day (QID) | ORAL | 0 refills | Status: DC | PRN

## 2016-03-28 MED ORDER — ACYCLOVIR 400 MG PO TABS: 800.0000 mg | ORAL_TABLET | Freq: Every day | ORAL | 0 refills | Status: DC

## 2016-03-28 NOTE — ED Provider Notes (Signed)
[facility] DEPT Provider Note   CSN: [id number] Arrival date & time: 03/28/16  1034  First Provider Contact:  First MD initiated contact with patient 03/28/16 11:21 AM.    By signing my name below, I, Hansel Feinstein, attest that this documentation has been prepared under the direction and in the presence of Montine Circle, PA-C. Electronically Signed: Hansel Feinstein, ED Scribe. 03/28/16. 11:29 AM.   History   Chief Complaint Chief Complaint  Patient presents with  . Rash    HPI Micheal Roth is a 40 y.o. male who presents to the Emergency Department complaining of a gradually worsening painful rash to the right thigh onset 2 days ago. He also complains of generalized right-sided myalgias at this time. No new soaps, lotions or detergents. He reports that his pain is worsened with palpation. Pt reports he had herpes zoster as a child. Per pt, he experienced nausea, emesis and fever last week, which have currently resolved. He denies current fever. No h/o DM, HIV.   The history is provided by the patient. No language interpreter was used.    Past Medical History:  Diagnosis Date  . Chronic back pain   . Chronic left shoulder pain   . Chronic neck pain   . Depression   . Migraines   . Paresthesia of left arm and leg    and weakness of left arm and leg  . Seizures [facility])     Patient Active Problem List   Diagnosis Date Noted  . Neck pain on left side 03/17/2013  . Pain in joint, shoulder region 03/17/2013  . Acute confusional state 12/18/2012  . ADVERSE DRUG REACTION 03/18/2010  . TOBACCO USER 05/10/2009  . MIGRAINE HEADACHE 02/15/2009  . LOW BACK PAIN 07/10/2008  . DEPRESSIVE DISORDER 07/04/2007    Past Surgical History:  Procedure Laterality Date  . BACK SURGERY    . KNEE SURGERY         Home Medications    Prior to Admission medications   Medication Sig Start Date End Date Taking? Authorizing Provider  acetaminophen (TYLENOL) 500 MG tablet Take 2,000 mg  by mouth daily as needed for headache.     Historical Provider, MD  HYDROcodone-acetaminophen (NORCO/VICODIN) 5-325 MG per tablet Take 1-2 tablets every 6 hours as needed for severe pain Patient not taking: Reported on 01/26/2016 02/06/15   Carlisle Cater, PA-C  ibuprofen (ADVIL,MOTRIN) 200 MG tablet Take 800 mg by mouth every 6 (six) hours as needed for moderate pain.    Historical Provider, MD  meloxicam (MOBIC) 7.5 MG tablet Take 1 tablet (7.5 mg total) by mouth daily as needed for pain. Patient not taking: Reported on 01/26/2016 03/02/15   Clayton Bibles, PA-C  methocarbamol (ROBAXIN) 500 MG tablet Take 2 tablets (1,000 mg total) by mouth 4 (four) times daily. Patient not taking: Reported on 01/26/2016 02/06/15   Carlisle Cater, PA-C    Family History Family History  Problem Relation Age of Onset  . Diabetes Father     Social History Social History  Substance Use Topics  . Smoking status: Former Smoker    Types: Cigarettes  . Smokeless tobacco: Never Used  . Alcohol use No     Allergies   Review of patient's allergies indicates no known allergies.   Review of Systems Review of Systems  Constitutional: Negative for fever.  Musculoskeletal: Positive for myalgias (generalized, right-sided).  Skin: Positive for rash.     Physical Exam Updated Vital Signs BP 135/85 (BP Location: Left  Arm)   Pulse 97   Temp 98.6 F (37 C) (Oral)   Resp 16   SpO2 98%   Physical Exam Physical Exam  Constitutional: Pt appears well-developed and well-nourished. No distress.  Awake, alert, nontoxic appearance  HENT:  Head: Normocephalic and atraumatic.  Mouth/Throat: Oropharynx is clear and moist. No oropharyngeal exudate.  Eyes: Conjunctivae are normal. No scleral icterus.  Neck: Normal range of motion. Neck supple.  Cardiovascular: Normal rate, regular rhythm and intact distal pulses.   Pulmonary/Chest: Effort normal and breath sounds normal. No respiratory distress. Pt has no wheezes.  Equal  chest expansion  Abdominal: Soft. Bowel sounds are normal. Pt exhibits no mass. There is no tenderness. There is no rebound and no guarding.  Musculoskeletal: Normal range of motion. Pt exhibits no edema.  Neurological: Pt is alert.  Speech is clear and goal oriented Moves extremities without ataxia  Skin: Skin is warm and dry. Pt is not diaphoretic. Mild erythematous rash to right medial thigh following the L3 dermatome. No other rashes.  Psychiatric: Pt has a normal mood and affect.  Nursing note and vitals reviewed.    ED Treatments / Results   Procedures Procedures (including critical care time)  DIAGNOSTIC STUDIES: Oxygen Saturation is 98% on RA, normal by my interpretation.    COORDINATION OF CARE: 11:26 AM Discussed treatment plan with pt at bedside which includes Acyclovir, prednisone, pain management and pt agreed to plan.    Medications Ordered in ED Medications - No data to display   Initial Impression / Assessment and Plan / ED Course  I have reviewed the triage vital signs and the nursing notes.  Pertinent labs & imaging results that were available during my care of the patient were reviewed by me and considered in my medical decision making (see chart for details).  Clinical Course    Patient with dermatomal rash concerning for shingles.  Tx with acyclovir, prednisone, and pain meds.  Contact precautions given for at risk populations.  Final Clinical Impressions(s) / ED Diagnoses   Final diagnoses:  Shingles    New Prescriptions New Prescriptions   ACYCLOVIR (ZOVIRAX) 400 MG TABLET    Take 2 tablets (800 mg total) by mouth 5 (five) times daily.   HYDROCODONE-ACETAMINOPHEN (NORCO/VICODIN) 5-325 MG TABLET    Take 2 tablets by mouth every 6 (six) hours as needed.   PREDNISONE (DELTASONE) 20 MG TABLET    Take 2 tablets (40 mg total) by mouth daily.   I personally performed the services described in this documentation, which was scribed in my presence. The  recorded information has been reviewed and is accurate.      Montine Circle, PA-C 03/28/16 [place], MD 04/01/16 [phone]

## 2016-03-28 NOTE — ED Triage Notes (Signed)
Pt reports to the ED for eval of rash to right thigh that started on Saturday. Pt denies any new soaps, detergents, foods, or clothing. States after he developed right sided joint pain. Pt denies any fall or injury to the right side. Pt A&Ox4, resp e/u, and skin warm and dry.

## 2016-04-03 ENCOUNTER — Ambulatory Visit (HOSPITAL_COMMUNITY)
Admission: EM | Admit: 2016-04-03 | Discharge: 2016-04-03 | Disposition: A | Discharge status: Home / Self Care | Payer: Self-pay | Attending: Family Medicine | Admitting: Family Medicine

## 2016-04-03 ENCOUNTER — Encounter (HOSPITAL_COMMUNITY): Payer: Self-pay | Admitting: Emergency Medicine

## 2016-04-03 DIAGNOSIS — R609 Edema, unspecified: Secondary | ICD-10-CM

## 2016-04-03 DIAGNOSIS — B0229 Other postherpetic nervous system involvement: Secondary | ICD-10-CM

## 2016-04-03 MED ORDER — FUROSEMIDE 40 MG PO TABS: 40.0000 mg | ORAL_TABLET | Freq: Every day | ORAL | 1 refills | Status: DC

## 2016-04-03 MED ORDER — GABAPENTIN 300 MG PO CAPS: 300.0000 mg | ORAL_CAPSULE | Freq: Three times a day (TID) | ORAL | 1 refills | Status: DC

## 2016-04-03 MED ORDER — HYDROCODONE-ACETAMINOPHEN 5-325 MG PO TABS: 2.0000 | ORAL_TABLET | ORAL | 0 refills | Status: DC | PRN

## 2016-04-03 MED ORDER — FUROSEMIDE 40 MG PO TABS: 40.0000 mg | ORAL_TABLET | Freq: Every day | ORAL | 0 refills | Status: DC

## 2016-04-03 NOTE — ED Triage Notes (Signed)
The patient presented to the [facility] with a complaint of right leg pain secondary to being diagnosed with shingles at the [facility] ED on 03/28/2016. The patient stated that he has completed the course of acyclovir and prednisone and the swelling and pain has started to increase.

## 2016-04-03 NOTE — Discharge Instructions (Signed)
Follow up if there are new or worsening of symptoms.

## 2016-04-03 NOTE — ED Provider Notes (Signed)
CSN: [id number]     Arrival date & time 04/03/16  1703 History   First MD Initiated Contact with Patient 04/03/16 1821     Chief Complaint  Patient presents with  . Leg Pain   (Consider location/radiation/quality/duration/timing/severity/associated sxs/prior Treatment) HPI 40 Y/O MALE DX AND TX FOR SHINGLES AT THE [facility]. STATES HE COMPLETED HIS MEDICATION, BUT NOW HAS SIGNIFICANT PAIN IN THE AREA OF THE SHINGLES, AND DOWN THE LEG. OTC MEDS NOT HELPING. RX FOR NORCO, DID HELP A BIT.  Past Medical History:  Diagnosis Date  . Chronic back pain   . Chronic left shoulder pain   . Chronic neck pain   . Depression   . Migraines   . Paresthesia of left arm and leg    and weakness of left arm and leg  . Seizures ([place])    Past Surgical History:  Procedure Laterality Date  . BACK SURGERY    . KNEE SURGERY     Family History  Problem Relation Age of Onset  . Diabetes Father    Social History  Substance Use Topics  . Smoking status: Former Smoker    Types: Cigarettes  . Smokeless tobacco: Never Used  . Alcohol use No    Review of Systems  Denies: HEADACHE, NAUSEA, ABDOMINAL PAIN, CHEST PAIN, CONGESTION, DYSURIA, SHORTNESS OF BREATH  Allergies  Review of patient's allergies indicates no known allergies.  Home Medications   Prior to Admission medications   Medication Sig Start Date End Date Taking? Authorizing Provider  acyclovir (ZOVIRAX) 400 MG tablet Take 2 tablets (800 mg total) by mouth 5 (five) times daily. 03/28/16  Yes Montine Circle, PA-C  HYDROcodone-acetaminophen (NORCO/VICODIN) 5-325 MG tablet Take 2 tablets by mouth every 6 (six) hours as needed. 03/28/16  Yes Montine Circle, PA-C  predniSONE (DELTASONE) 20 MG tablet Take 2 tablets (40 mg total) by mouth daily. 03/28/16  Yes Montine Circle, PA-C  acetaminophen (TYLENOL) 500 MG tablet Take 2,000 mg by mouth daily as needed for headache.     Historical Provider, MD  furosemide (LASIX) 40 MG tablet Take 1 tablet (40 mg  total) by mouth daily. 04/03/16   Konrad Felix, PA  furosemide (LASIX) 40 MG tablet Take 1 tablet (40 mg total) by mouth daily. 04/03/16   Konrad Felix, PA  gabapentin (NEURONTIN) 300 MG capsule Take 1 capsule (300 mg total) by mouth 3 (three) times daily. 04/03/16   Konrad Felix, PA  HYDROcodone-acetaminophen (NORCO/VICODIN) 5-325 MG tablet Take 2 tablets by mouth every 4 (four) hours as needed. 04/03/16   Konrad Felix, PA  ibuprofen (ADVIL,MOTRIN) 200 MG tablet Take 800 mg by mouth every 6 (six) hours as needed for moderate pain.    Historical Provider, MD  meloxicam (MOBIC) 7.5 MG tablet Take 1 tablet (7.5 mg total) by mouth daily as needed for pain. Patient not taking: Reported on 01/26/2016 03/02/15   Clayton Bibles, PA-C  methocarbamol (ROBAXIN) 500 MG tablet Take 2 tablets (1,000 mg total) by mouth 4 (four) times daily. Patient not taking: Reported on 01/26/2016 02/06/15   Carlisle Cater, PA-C   Meds Ordered and Administered this Visit  Medications - No data to display  BP 136/86 (BP Location: Left Arm)   Pulse (!) 58   Temp 98.7 F (37.1 C) (Oral)   Resp 18   SpO2 99%  No data found.   Physical Exam NURSES NOTES AND VITAL SIGNS REVIEWED. CONSTITUTIONAL: Well developed, well nourished, no acute distress HEENT: normocephalic, atraumatic  EYES: Conjunctiva normal NECK:normal ROM, supple, no adenopathy PULMONARY:No respiratory distress, normal effort ABDOMINAL: Soft, ND, NT BS+, No CVAT MUSCULOSKELETAL: Normal ROM of all extremities,  SKIN: warm and dry without rash PSYCHIATRIC: Mood and affect, behavior are normal  Urgent Care Course   Clinical Course    Procedures (including critical care time)  Labs Review Labs Reviewed - No data to display  Imaging Review No results found.   Visual Acuity Review  Right Eye Distance:   Left Eye Distance:   Bilateral Distance:    Right Eye Near:   Left Eye Near:    Bilateral Near:       POST HERPETIC NEURALGIA TX WITH  GABAPENTIN RX FOR HYDROCODONE ALSO GIVEN.   MDM   1. Peripheral edema   2. Neuralgia, post-herpetic     Patient is reassured that there are no issues that require transfer to higher level of care at this time or additional tests. Patient is advised to continue home symptomatic treatment. Patient is advised that if there are new or worsening symptoms to attend the emergency department, contact primary care provider, or return to UC. Instructions of care provided discharged home in stable condition.    THIS NOTE WAS GENERATED USING A VOICE RECOGNITION SOFTWARE PROGRAM. ALL REASONABLE EFFORTS  WERE MADE TO PROOFREAD THIS DOCUMENT FOR ACCURACY.  I have verbally reviewed the discharge instructions with the patient. A printed AVS was given to the patient.  All questions were answered prior to discharge.      Konrad Felix, [place] 04/03/16 2039

## 2016-04-24 ENCOUNTER — Emergency Department (HOSPITAL_COMMUNITY)
Admission: EM | Admit: 2016-04-24 | Discharge: 2016-04-24 | Disposition: A | Discharge status: Home / Self Care | Payer: Self-pay | Attending: Emergency Medicine | Admitting: Emergency Medicine

## 2016-04-24 ENCOUNTER — Encounter (HOSPITAL_COMMUNITY): Payer: Self-pay

## 2016-04-24 ENCOUNTER — Encounter (HOSPITAL_COMMUNITY): Payer: Self-pay | Admitting: Family Medicine

## 2016-04-24 ENCOUNTER — Ambulatory Visit (HOSPITAL_COMMUNITY)
Admission: EM | Admit: 2016-04-24 | Discharge: 2016-04-24 | Disposition: A | Discharge status: Nursing Home (Includes ICF) | Payer: Self-pay | Attending: Emergency Medicine | Admitting: Emergency Medicine

## 2016-04-24 ENCOUNTER — Ambulatory Visit (INDEPENDENT_AMBULATORY_CARE_PROVIDER_SITE_OTHER): Payer: Self-pay

## 2016-04-24 DIAGNOSIS — M79604 Pain in right leg: Secondary | ICD-10-CM

## 2016-04-24 DIAGNOSIS — R609 Edema, unspecified: Secondary | ICD-10-CM

## 2016-04-24 DIAGNOSIS — M7989 Other specified soft tissue disorders: Secondary | ICD-10-CM | POA: Insufficient documentation

## 2016-04-24 DIAGNOSIS — Z87891 Personal history of nicotine dependence: Secondary | ICD-10-CM | POA: Insufficient documentation

## 2016-04-24 MED ORDER — ENOXAPARIN SODIUM 150 MG/ML ~~LOC~~ SOLN
1.5000 mg/kg | Freq: Once | SUBCUTANEOUS | Status: AC
  Administered 2016-04-24: 140 mg via SUBCUTANEOUS
  Filled 2016-04-24: qty 0.94

## 2016-04-24 MED ORDER — GABAPENTIN 300 MG PO CAPS: 300.0000 mg | ORAL_CAPSULE | Freq: Three times a day (TID) | ORAL | 1 refills | Status: DC

## 2016-04-24 MED ORDER — TRAMADOL HCL 50 MG PO TABS: 50.0000 mg | ORAL_TABLET | Freq: Four times a day (QID) | ORAL | 0 refills | Status: DC | PRN

## 2016-04-24 MED ORDER — OXYCODONE-ACETAMINOPHEN 5-325 MG PO TABS
1.0000 | ORAL_TABLET | Freq: Once | ORAL | Status: AC
  Administered 2016-04-24: 1 via ORAL
  Filled 2016-04-24: qty 1

## 2016-04-24 NOTE — ED Triage Notes (Signed)
Pt here for ongoing shingles rash that has been coming and going. sts continuing to have right leg pain and swelling with foot. sts he finished all the medication and not better. sts joint pain.

## 2016-04-24 NOTE — ED Provider Notes (Signed)
CSN: [id number]     Arrival date & time 04/24/16  1346 History   First MD Initiated Contact with Patient 04/24/16 1442     Chief Complaint  Patient presents with  . Foot Pain  . Leg Swelling   (Consider location/radiation/quality/duration/timing/severity/associated sxs/prior Treatment) 40 year old male presents to the ED with complaints of increasing pain and swelling to the right lower extremity below the knee this started approximately one month ago.. One month ago he went to the emergency department and diagnosed with what appeared to be shingles located to the right thigh and a portion of the proximal medial tib-fib. This rash has since abated. The pain in his right lower extremity has increased over the past couple of days. He has pain with weightbearing and there is increasing edema.  Patient remembered that just prior to the swelling of his right leg. He had to travel about 3 times a week several hours during the day to go to work in [place]. He also flew to [place] and back during that time.      Past Medical History:  Diagnosis Date  . Chronic back pain   . Chronic left shoulder pain   . Chronic neck pain   . Depression   . Migraines   . Paresthesia of left arm and leg    and weakness of left arm and leg  . Seizures ([place])    Past Surgical History:  Procedure Laterality Date  . BACK SURGERY    . KNEE SURGERY     Family History  Problem Relation Age of Onset  . Diabetes Father    Social History  Substance Use Topics  . Smoking status: Former Smoker    Types: Cigarettes  . Smokeless tobacco: Never Used  . Alcohol use No    Review of Systems  Constitutional: Negative for activity change and fever.  HENT: Negative.   Respiratory: Negative.   Gastrointestinal: Negative.   Skin: Negative.   Neurological: Negative.   All other systems reviewed and are negative.   Allergies  Review of patient's allergies indicates no known allergies.  Home Medications    Prior to Admission medications   Medication Sig Start Date End Date Taking? Authorizing Provider  acyclovir (ZOVIRAX) 400 MG tablet Take 2 tablets (800 mg total) by mouth 5 (five) times daily. 03/28/16   Montine Circle, PA-C  gabapentin (NEURONTIN) 300 MG capsule Take 1 capsule (300 mg total) by mouth 3 (three) times daily. 04/03/16   Konrad Felix, PA  HYDROcodone-acetaminophen (NORCO/VICODIN) 5-325 MG tablet Take 2 tablets by mouth every 6 (six) hours as needed. 03/28/16   Montine Circle, PA-C  HYDROcodone-acetaminophen (NORCO/VICODIN) 5-325 MG tablet Take 2 tablets by mouth every 4 (four) hours as needed. 04/03/16   Konrad Felix, PA  ibuprofen (ADVIL,MOTRIN) 200 MG tablet Take 800 mg by mouth every 6 (six) hours as needed for moderate pain.    Historical Provider, MD  meloxicam (MOBIC) 7.5 MG tablet Take 1 tablet (7.5 mg total) by mouth daily as needed for pain. Patient not taking: Reported on 01/26/2016 03/02/15   Clayton Bibles, PA-C  methocarbamol (ROBAXIN) 500 MG tablet Take 2 tablets (1,000 mg total) by mouth 4 (four) times daily. Patient not taking: Reported on 01/26/2016 02/06/15   Carlisle Cater, PA-C  predniSONE (DELTASONE) 20 MG tablet Take 2 tablets (40 mg total) by mouth daily. 03/28/16   Montine Circle, PA-C   Meds Ordered and Administered this Visit  Medications - No data to display  BP (!) 146/8   Pulse 89   Temp 98.8 F (37.1 C)   Resp 18   SpO2 100%  No data found.   Physical Exam  Constitutional: He is oriented to person, place, and time. He appears well-developed and well-nourished.  HENT:  Head: Normocephalic and atraumatic.  Eyes: EOM are normal. Left eye exhibits no discharge.  Neck: Normal range of motion. Neck supple.  Cardiovascular: Normal rate.   Pulmonary/Chest: Effort normal.  Musculoskeletal: He exhibits edema and tenderness. He exhibits no deformity.  Right lower extremity with tenderness and 2+ pitting edema below the knee including the ankle and  foot. Tenderness to most all of these areas with swelling. No deformity. Unable to place foot down to bear weight due to pain. No evidence of rash or other skin changes. No skin sensitivity. No discoloration or evidence of infection. Distal vascular intact. Pedal pulses 1+, difficult to palpate with the edema.  Neurological: He is alert and oriented to person, place, and time. No cranial nerve deficit.  Skin: Skin is warm and dry.  Psychiatric: He has a normal mood and affect.  Nursing note and vitals reviewed.   Urgent Care Course   Clinical Course    Procedures (including critical care time)  Labs Review Labs Reviewed - No data to display  Imaging Review Dg Tibia/fibula Right  Result Date: 04/24/2016 CLINICAL DATA:  Right leg and foot swelling for 1 month, no known injury EXAM: RIGHT TIBIA AND FIBULA - 2 VIEW COMPARISON:  01/24/2013 FINDINGS: Two views of the right tibia-fibula submitted. No acute fracture or subluxation. No radiopaque foreign body. IMPRESSION: Negative. Electronically Signed   By: Lahoma Crocker M.D.   On: 04/24/2016 15:23     Visual Acuity Review  Right Eye Distance:   Left Eye Distance:   Bilateral Distance:    Right Eye Near:   Left Eye Near:    Bilateral Near:         MDM   1. Peripheral edema   2. Pain of right lower extremity    Patient with unilateral edema and pain to the right lower extremity below the knee. He notes that prior to the onset of symptoms he had been traveling 3 times a week over long distances to go to work. He is also traveled to [place] and back in an airplane. X-ray performed here shows a normal tib-fib x-ray. Transferring to the emergency department for evaluation of possible DVT.    Janne Napoleon, NP 04/24/16 [phone]

## 2016-04-24 NOTE — ED Notes (Signed)
Pt d/c home via wheelchair.

## 2016-04-24 NOTE — ED Notes (Signed)
Electronic signature is not working at this time.

## 2016-04-24 NOTE — ED Provider Notes (Signed)
[facility] DEPT Provider Note   CSN: [id number] Arrival date & time: 04/24/16  1623     History   Chief Complaint Chief Complaint  Patient presents with  . Leg Swelling    HPI Micheal Roth is a 40 y.o. male.  HPI   Patient sent to the ER by the Urgent Care for evaluation of right lower leg swelling. An xray was done at the Urgent Care and it was normal. He had been traveling before the swelling started. He has not had any SOB or CP. He is not tachycardic or tachypnea. He denies hx of DVT in the past. He also has hx of surgery to this leg as well as shingles 6-7 weeks ago to the same leg.   Past Medical History:  Diagnosis Date  . Chronic back pain   . Chronic left shoulder pain   . Chronic neck pain   . Depression   . Migraines   . Paresthesia of left arm and leg    and weakness of left arm and leg  . Seizures [facility])     Patient Active Problem List   Diagnosis Date Noted  . Neck pain on left side 03/17/2013  . Pain in joint, shoulder region 03/17/2013  . Acute confusional state 12/18/2012  . ADVERSE DRUG REACTION 03/18/2010  . TOBACCO USER 05/10/2009  . MIGRAINE HEADACHE 02/15/2009  . LOW BACK PAIN 07/10/2008  . DEPRESSIVE DISORDER 07/04/2007    Past Surgical History:  Procedure Laterality Date  . BACK SURGERY    . KNEE SURGERY         Home Medications    Prior to Admission medications   Medication Sig Start Date End Date Taking? Authorizing Provider  acyclovir (ZOVIRAX) 400 MG tablet Take 2 tablets (800 mg total) by mouth 5 (five) times daily. 03/28/16   Montine Circle, PA-C  gabapentin (NEURONTIN) 300 MG capsule Take 1 capsule (300 mg total) by mouth 3 (three) times daily. 04/24/16   Delos Haring, PA-C  HYDROcodone-acetaminophen (NORCO/VICODIN) 5-325 MG tablet Take 2 tablets by mouth every 6 (six) hours as needed. 03/28/16   Montine Circle, PA-C  HYDROcodone-acetaminophen (NORCO/VICODIN) 5-325 MG tablet Take 2 tablets by mouth every 4 (four)  hours as needed. 04/03/16   Konrad Felix, PA  ibuprofen (ADVIL,MOTRIN) 200 MG tablet Take 800 mg by mouth every 6 (six) hours as needed for moderate pain.    Historical Provider, MD  meloxicam (MOBIC) 7.5 MG tablet Take 1 tablet (7.5 mg total) by mouth daily as needed for pain. Patient not taking: Reported on 01/26/2016 03/02/15   Clayton Bibles, PA-C  methocarbamol (ROBAXIN) 500 MG tablet Take 2 tablets (1,000 mg total) by mouth 4 (four) times daily. Patient not taking: Reported on 01/26/2016 02/06/15   Carlisle Cater, PA-C  predniSONE (DELTASONE) 20 MG tablet Take 2 tablets (40 mg total) by mouth daily. 03/28/16   Montine Circle, PA-C  traMADol (ULTRAM) 50 MG tablet Take 1 tablet (50 mg total) by mouth every 6 (six) hours as needed. 04/24/16   Delos Haring, PA-C    Family History Family History  Problem Relation Age of Onset  . Diabetes Father     Social History Social History  Substance Use Topics  . Smoking status: Former Smoker    Types: Cigarettes  . Smokeless tobacco: Never Used  . Alcohol use No     Allergies   Review of patient's allergies indicates no known allergies.   Review of Systems Review of Systems Review  of Systems All other systems negative except as documented in the HPI. All pertinent positives and negatives as reviewed in the HPI.   Physical Exam Updated Vital Signs BP 151/82 (BP Location: Right Arm)   Pulse 65   Temp 98.6 F (37 C) (Oral)   Resp 16   Ht 6' (1.829 m)   Wt 94.3 kg   SpO2 98%   BMI 28.21 kg/m   Physical Exam  Constitutional: He appears well-developed and well-nourished. No distress.  HENT:  Head: Normocephalic and atraumatic.  Right Ear: Tympanic membrane and ear canal normal.  Left Ear: Tympanic membrane and ear canal normal.  Nose: Nose normal.  Mouth/Throat: Uvula is midline, oropharynx is clear and moist and mucous membranes are normal.  Eyes: Pupils are equal, round, and reactive to light.  Neck: Normal range of motion. Neck  supple.  Cardiovascular: Normal rate and regular rhythm.   Pulmonary/Chest: Effort normal.  Abdominal: Soft.  No signs of abdominal distention  Musculoskeletal:  RLE with tenderness and 2+ edema below the knee including the ankle and foot. No deformity. No rash. Strong pulses palpable  Neurological: He is alert.  Acting at baseline  Skin: Skin is warm and dry. No rash noted.  Nursing note and vitals reviewed.    ED Treatments / Results  Labs (all labs ordered are listed, but only abnormal results are displayed) Labs Reviewed - No data to display  EKG  EKG Interpretation None       Radiology Dg Tibia/fibula Right  Result Date: 04/24/2016 CLINICAL DATA:  Right leg and foot swelling for 1 month, no known injury EXAM: RIGHT TIBIA AND FIBULA - 2 VIEW COMPARISON:  01/24/2013 FINDINGS: Two views of the right tibia-fibula submitted. No acute fracture or subluxation. No radiopaque foreign body. IMPRESSION: Negative. Electronically Signed   By: Lahoma Crocker M.D.   On: 04/24/2016 15:23    Procedures Procedures (including critical care time)  Medications Ordered in ED Medications  oxyCODONE-acetaminophen (PERCOCET/ROXICET) 5-325 MG per tablet 1 tablet (not administered)  enoxaparin (LOVENOX) injection 140 mg (not administered)     Initial Impression / Assessment and Plan / ED Course  I have reviewed the triage vital signs and the nursing notes.  Pertinent labs & imaging results that were available during my care of the patient were reviewed by me and considered in my medical decision making (see chart for details).  Clinical Course    Dose of Lovenox given in ED, pt will come back at 8 am for LE [place]. It is possible that he is having neuropathic pain as he has hx of the same in the left but without the swelling. Will tx Gapapentin for and referral to Ortho if his DVT test is negative. Discussed if DVT test is positive he will need to check back into the hospital for  treatment.  Clinically no s/sx consistent with PE.   Final Clinical Impressions(s) / ED Diagnoses   Final diagnoses:  Right leg swelling    New Prescriptions New Prescriptions   TRAMADOL (ULTRAM) 50 MG TABLET    Take 1 tablet (50 mg total) by mouth every 6 (six) hours as needed.     Delos Haring, PA-C 04/24/16 [id number]    Margette Fast, MD 04/24/16 [phone]

## 2016-04-24 NOTE — ED Triage Notes (Signed)
Pt states ongoing swelling in R foot/leg x 1 month. Pt states new onset pain today, unable to bear weight. Pt seen at [facility] today. Sent here to rule out blood clot.

## 2016-04-25 ENCOUNTER — Other Ambulatory Visit (HOSPITAL_COMMUNITY): Payer: Self-pay | Admitting: Emergency Medicine

## 2016-04-25 ENCOUNTER — Ambulatory Visit (HOSPITAL_COMMUNITY)
Admission: RE | Admit: 2016-04-25 | Discharge: 2016-04-25 | Disposition: A | Discharge status: Home / Self Care | Payer: Self-pay | Source: Ambulatory Visit | Attending: Emergency Medicine | Admitting: Emergency Medicine

## 2016-04-25 DIAGNOSIS — M25474 Effusion, right foot: Secondary | ICD-10-CM | POA: Insufficient documentation

## 2016-04-25 DIAGNOSIS — M25571 Pain in right ankle and joints of right foot: Secondary | ICD-10-CM | POA: Insufficient documentation

## 2016-04-25 DIAGNOSIS — M25471 Effusion, right ankle: Secondary | ICD-10-CM | POA: Insufficient documentation

## 2016-04-25 NOTE — Progress Notes (Signed)
*  PRELIMINARY RESULTS* Vascular Ultrasound Right lower extremity venous duplex has been completed.  Preliminary findings: No evidence of DVT or baker's cyst.  Landry Mellow, RDMS, RVT  04/25/2016, 9:13 AM

## 2016-04-26 ENCOUNTER — Encounter: Payer: Self-pay | Admitting: Family Medicine

## 2016-04-26 ENCOUNTER — Ambulatory Visit (INDEPENDENT_AMBULATORY_CARE_PROVIDER_SITE_OTHER): Payer: Self-pay | Admitting: Family Medicine

## 2016-04-26 VITALS — BP 130/79 | HR 73 | Ht 69.75 in | Wt 210.5 lb

## 2016-04-26 DIAGNOSIS — G8929 Other chronic pain: Secondary | ICD-10-CM | POA: Insufficient documentation

## 2016-04-26 DIAGNOSIS — F1910 Other psychoactive substance abuse, uncomplicated: Secondary | ICD-10-CM

## 2016-04-26 DIAGNOSIS — R600 Localized edema: Secondary | ICD-10-CM

## 2016-04-26 DIAGNOSIS — M542 Cervicalgia: Secondary | ICD-10-CM

## 2016-04-26 DIAGNOSIS — S060XAA Concussion with loss of consciousness status unknown, initial encounter: Secondary | ICD-10-CM | POA: Insufficient documentation

## 2016-04-26 DIAGNOSIS — S060X9A Concussion with loss of consciousness of unspecified duration, initial encounter: Secondary | ICD-10-CM | POA: Insufficient documentation

## 2016-04-26 DIAGNOSIS — Z765 Malingerer [conscious simulation]: Secondary | ICD-10-CM

## 2016-04-26 DIAGNOSIS — R569 Unspecified convulsions: Secondary | ICD-10-CM | POA: Insufficient documentation

## 2016-04-26 DIAGNOSIS — B0229 Other postherpetic nervous system involvement: Secondary | ICD-10-CM | POA: Insufficient documentation

## 2016-04-26 DIAGNOSIS — Z8619 Personal history of other infectious and parasitic diseases: Secondary | ICD-10-CM

## 2016-04-26 MED ORDER — GABAPENTIN 300 MG PO CAPS: 300.0000 mg | ORAL_CAPSULE | Freq: Three times a day (TID) | ORAL | 0 refills | Status: DC

## 2016-04-26 MED ORDER — AMITRIPTYLINE HCL 25 MG PO TABS: ORAL_TABLET | ORAL | 0 refills | Status: DC

## 2016-04-26 NOTE — Assessment & Plan Note (Signed)
Last sz- was 4 yrs ago.  Seen / txed by Dr Jannifer Franklin- Neuro.  Pt was stable- told to f/up as needed per pt.

## 2016-04-26 NOTE — Patient Instructions (Addendum)
Beware that Neurontin can be sedating.  Since she tolerated it in the past she should've no problems this time. What I wanted to do his every week increase your dose. Always increase the night dose first  AM   Afternoon   PM 1   1    2- for one wk then  1   2    2  for one week  then  2   2    2     --->   Use ted hose/ Tight stockings on b/l feet/ legs daily.    Pt understands he may need to go for vasc studies in future.

## 2016-04-26 NOTE — Assessment & Plan Note (Signed)
Not current issue for pt--rarely gets them now

## 2016-04-26 NOTE — Progress Notes (Signed)
New patient office visit note:  Impression and Recommendations:    1. Edema of right lower extremity   2. History of shingles   3. Neuralgia, postherpetic- R L Ext   4. ?able Drug-seeking behavior     Unclear etiology as to cause of chronic right lower extremity edema. Wear TED hose daily  Patient will continue to work as tolerated. He is on his feet 12 hours a day or more.  Explained to patient I do not treat neuropathic pain with narcotic pain medicines.  Please see medications prescribed and orders below.  Also see AVS for details of further patient instruction and my further recommendations    h/o Seizures ([place]) Last sz- was 4 yrs ago.  Seen / txed by Dr Jannifer Franklin- Neuro.  Pt was stable- told to f/up as needed per pt.     h/o TOBACCO USER Quit 2 yrs ago.   Only smoked tobacco.   55yrs at 0.75 ppd.  Adjustment disorder with mixed anxiety and depressed mood Not a current issue for pt.  Can handle through lifestyle changes/ stress management techniques  h/o Migraine Not current issue for pt--rarely gets them now  h/o chronic LOW BACK PAIN Dr Tonita Cong- [facility]-  Told he needed lumbar fusion.   Had disc "shaving" in past.   History of many Head concussions Patient recalls many had concussions in the past when he was a kid and teenager. Denies any memory issues.   - Neurontin restarted on patient as he has not been on it for at least 2 weeks. Also started patient on Elavil.  The patient was counseled, risk factors were discussed, anticipatory guidance given.  Gross side effects, risk and benefits, and alternatives of medications discussed with patient.  Patient is aware that all medications have potential side effects and we are unable to predict every side effect or drug-drug interaction that may occur.  Expresses verbal understanding and consents to current therapy plan and treatment regimen.   Patient's Medications  New Prescriptions   AMITRIPTYLINE  (ELAVIL) 25 MG TABLET    1-2 as needed at night for pain  Previous Medications   ACETAMINOPHEN (TYLENOL) 500 MG TABLET    Take 2,000 mg by mouth every 4 (four) hours as needed for mild pain.    IBUPROFEN (ADVIL,MOTRIN) 200 MG TABLET    Take 800 mg by mouth every 6 (six) hours as needed for moderate pain.   OMEPRAZOLE (PRILOSEC) 10 MG CAPSULE    Take 10 mg by mouth daily as needed (for heartburn or acid reflux).   Modified Medications   Modified Medication Previous Medication   GABAPENTIN (NEURONTIN) 300 MG CAPSULE gabapentin (NEURONTIN) 300 MG capsule      Take 1 capsule (300 mg total) by mouth 3 (three) times daily. After 1 wk- take 1in am, 1 in afternoon and 2 PM. After another week- take 1 in am, 2 afternoon and 2 in pm's    Take 1 capsule (300 mg total) by mouth 3 (three) times daily.  Discontinued Medications   TRAMADOL (ULTRAM) 50 MG TABLET    Take 1 tablet (50 mg total) by mouth every 6 (six) hours as needed.    Return in about 4 weeks (around 05/24/2016).  Please see AVS handed out to patient at the end of our visit for further patient instructions/ counseling done pertaining to today's office visit.    Note: This document was prepared using Systems analyst and may  include unintentional dictation errors.  ----------------------------------------------------------------------------------------------------------------------    Subjective:    Chief Complaint  Patient presents with  . Establish Care    HPI: Micheal Roth is a pleasant 40 y.o. male who presents to [place] [facility] today to review their medical history with me and establish care.    Extensive past medical history regarding his trips to the emergency room especially since [phone] were reviewed. Patient is primarily here today because of lower extremity peripheral edema and postherpetic neuralgia along with extreme pain of the right lower extremity.  Patient has  extensive history of various painful joints and traumas. He has had several trips to the emergency room for pain medications and evaluation.   Patient states he did not have a job prior and was unable to afford medical insurance so the ER was his only option for medical care over the past several years.   Given tramadol last night by ER- no help per patient.  Had -Doppler of right lower extremity- negative recently.    Says he can't tolerate tramadol-makes him feel weird and spacey    When he steps on right foot---> pain described as a sharp intense pain and feels like his foot/ankle is going to crush, hot knife like pain- lasts 5-32min.  pain comes and goes all throughout the day though.   Some pain coming up leg.  Patient has hyperalgesia to the right inner thigh from essentially groin to below the knee on the medial side as well as in a sock-like distribution of his lower extremity.  Pain is W- first thing in AM and when it gets dark out.   Hurts at night--> sheets rubbing on his R leg bother him.  - Stopped Neurontin 2 wks ago.   Ran out of meds but patient thinks it worked well.   Patient was diagnosed with a Shingles rash - 1 mo ago- R LExt-  From upper leg--inner aspect to about 4 inches below knee.  Stayed on Inner aspect leg.  Rash lasted a week.   The day following his onset of his painful rash, he was evaluated in the emergency room and given valtrex.   Patient has a history of depression in the remote past and tried Cymbalta but it did not work for him. He can't recall other medicines he was on at this time.   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   Lives with GF, son- 37 yo.      Wt Readings from Last 3 Encounters:  04/26/16 210 lb 8 oz (95.5 kg)  04/24/16 208 lb (94.3 kg)  01/26/16 200 lb (90.7 kg)   BP Readings from Last 3 Encounters:  04/26/16 130/79  04/24/16 148/83  04/24/16 (!) 146/8   Pulse  Readings from Last 3 Encounters:  04/26/16 73  04/24/16 81  04/24/16 89     Patient Active Problem List   Diagnosis Date Noted  . h/o Seizures ([place]) 04/26/2016  . History of many Head concussions 04/26/2016  . h/o Chronic neck pain 04/26/2016  . History of shingles 04/26/2016  . Neuralgia, postherpetic- R L Ext 04/26/2016  . ?able Drug-seeking behavior 04/26/2016  . Edema of right lower extremity 04/26/2016  . h/o Weakness of left arm 07/14/2013  . h/o Paresthesia of left arm 07/14/2013  . h/o Neck pain on left side 03/17/2013  . h/o Pain in joint, R shoulder  region 03/17/2013  . Acute confusional state 12/18/2012  . h/o TOBACCO USER 05/10/2009  . h/o Migraine 02/15/2009  . h/o chronic LOW BACK PAIN 07/10/2008  . Adjustment disorder with mixed anxiety and depressed mood 07/04/2007     Past Medical History:  Diagnosis Date  . Chronic back pain   . Chronic left shoulder pain   . Chronic neck pain   . Depression   . Migraines   . Paresthesia of left arm and leg    and weakness of left arm and leg  . Seizures ([place])      Past Surgical History:  Procedure Laterality Date  . BACK SURGERY    . KNEE SURGERY       Family History  Problem Relation Age of Onset  . Diabetes Father   . Diabetes Maternal Grandmother   . Heart disease Maternal Grandmother     CHF  . Heart attack Maternal Grandmother      History  Drug Use No    History  Alcohol Use No    History  Smoking Status  . Former Smoker  . Types: Cigarettes  . Quit date: 09/04/2013  Smokeless Tobacco  . Never Used    Patient's Medications  New Prescriptions   AMITRIPTYLINE (ELAVIL) 25 MG TABLET    1-2 as needed at night for pain  Previous Medications   ACETAMINOPHEN (TYLENOL) 500 MG TABLET    Take 2,000 mg by mouth every 4 (four) hours as needed for mild pain.    IBUPROFEN (ADVIL,MOTRIN) 200 MG TABLET    Take 800 mg by mouth every 6 (six) hours as needed for moderate pain.   OMEPRAZOLE (PRILOSEC)  10 MG CAPSULE    Take 10 mg by mouth daily as needed (for heartburn or acid reflux).   Modified Medications   Modified Medication Previous Medication   GABAPENTIN (NEURONTIN) 300 MG CAPSULE gabapentin (NEURONTIN) 300 MG capsule      Take 1 capsule (300 mg total) by mouth 3 (three) times daily. After 1 wk- take 1in am, 1 in afternoon and 2 PM. After another week- take 1 in am, 2 afternoon and 2 in pm's    Take 1 capsule (300 mg total) by mouth 3 (three) times daily.  Discontinued Medications   TRAMADOL (ULTRAM) 50 MG TABLET    Take 1 tablet (50 mg total) by mouth every 6 (six) hours as needed.    Allergies: Review of patient's allergies indicates no known allergies.  Review of Systems:   ( Completed via Adult Medical History Intake form today ) General:  Denies fever, chills, appetite changes, unexplained weight loss.  Optho/Auditory:   Denies visual changes, blurred vision/LOV, ringing in ears/ diff hearing Respiratory:   Denies SOB, DOE, cough, wheezing.  Cardiovascular:   Denies chest pain, palpitations, new onset peripheral edema  Gastrointestinal:   Denies nausea, vomiting, diarrhea.  Genitourinary:    Denies dysuria, increased frequency, flank pain.  Endocrine:     Denies hot or cold intolerance, polyuria, polydipsia. Musculoskeletal:  Denies unexplained myalgias, joint swelling, arthralgias, gait problems.  Skin:  Denies rash, suspicious lesions or new/ changes in moles Neurological:    Denies dizziness, syncope, unexplained weakness, lightheadedness, numbness  Psychiatric/Behavioral:   Denies mood changes, suicidal or homicidal ideations, hallucinations    Objective:   Blood pressure 130/79, pulse 73, height 5' 9.75" (1.772 m), weight 210 lb 8 oz (95.5 kg). Body mass index is 30.42 kg/m.   General: Well Developed, well nourished, and in  no acute distress.  Neuro: Alert and oriented x3, extra-ocular muscles intact, sensation grossly intact; mild hyperalgesia of right foot to  light touch  HEENT: Normocephalic, atraumatic, pupils equal round reactive to light, neck supple, no gross masses, no carotid bruits, no JVD apprec Skin: no gross suspicious lesions or rashes  Cardiac: Regular rate and rhythm, no murmurs rubs or gallops.  Respiratory: Essentially clear to auscultation bilaterally. Not using accessory muscles, speaking in full sentences.  Abdominal: Soft, not grossly distended Musculoskeletal: Ambulates w/o diff, FROM * 4 ext. no bony tenderness to palpation right lower extremity including surrounding ankle, foot, knee, hip.  No laxity of knee, endpoints to all ligamentous testing are clean.  Vasc: less 2 sec cap RF, warm and pink; right lower extremity 2-3+ edema versus left 1+ Psych:  No HI/SI, judgement and insight good.

## 2016-04-26 NOTE — Assessment & Plan Note (Signed)
Quit 2 yrs ago.   Only smoked tobacco.   26yrs at 0.75 ppd.

## 2016-04-26 NOTE — Assessment & Plan Note (Addendum)
Not a current issue for pt.  Can handle through lifestyle changes/ stress management techniques

## 2016-04-26 NOTE — Assessment & Plan Note (Signed)
Dr Tonita Cong- [facility]-  Told he needed lumbar fusion.   Had disc "shaving" in past.

## 2016-04-26 NOTE — Assessment & Plan Note (Signed)
Patient recalls many had concussions in the past when he was a kid and teenager. Denies any memory issues.

## 2016-05-29 ENCOUNTER — Ambulatory Visit (INDEPENDENT_AMBULATORY_CARE_PROVIDER_SITE_OTHER): Payer: Self-pay | Admitting: Family Medicine

## 2016-05-29 NOTE — Progress Notes (Deleted)
Pt was a no show

## 2016-06-12 ENCOUNTER — Emergency Department (HOSPITAL_COMMUNITY)
Admission: EM | Admit: 2016-06-12 | Discharge: 2016-06-12 | Disposition: A | Discharge status: Home / Self Care | Payer: Self-pay | Attending: Emergency Medicine | Admitting: Emergency Medicine

## 2016-06-12 ENCOUNTER — Emergency Department (HOSPITAL_COMMUNITY): Payer: Self-pay

## 2016-06-12 ENCOUNTER — Encounter (HOSPITAL_COMMUNITY): Payer: Self-pay | Admitting: Emergency Medicine

## 2016-06-12 DIAGNOSIS — Z79899 Other long term (current) drug therapy: Secondary | ICD-10-CM | POA: Insufficient documentation

## 2016-06-12 DIAGNOSIS — M545 Low back pain: Secondary | ICD-10-CM | POA: Insufficient documentation

## 2016-06-12 DIAGNOSIS — R202 Paresthesia of skin: Secondary | ICD-10-CM | POA: Insufficient documentation

## 2016-06-12 DIAGNOSIS — Z87891 Personal history of nicotine dependence: Secondary | ICD-10-CM | POA: Insufficient documentation

## 2016-06-12 MED ORDER — IBUPROFEN 600 MG PO TABS: 600.0000 mg | ORAL_TABLET | Freq: Three times a day (TID) | ORAL | 0 refills | Status: AC

## 2016-06-12 MED ORDER — KETOROLAC TROMETHAMINE 30 MG/ML IJ SOLN
30.0000 mg | Freq: Once | INTRAMUSCULAR | Status: AC
  Administered 2016-06-12: 30 mg via INTRAMUSCULAR
  Filled 2016-06-12: qty 1

## 2016-06-12 MED ORDER — LORAZEPAM 1 MG PO TABS
1.0000 mg | ORAL_TABLET | Freq: Once | ORAL | Status: AC
Start: 2016-06-12 — End: 2016-06-12
  Administered 2016-06-12: 1 mg via ORAL
  Filled 2016-06-12: qty 1

## 2016-06-12 NOTE — ED Triage Notes (Signed)
Patient c/o lower back pain since Friday. Patient had surgery years ago and states that he has been doing some lifting recently and believes flared up the pain.  Patient unable to sit, move, anything to relieve the pain.  Patient states has numbness in left leg and cant bear any weight on it.

## 2016-06-12 NOTE — ED Notes (Signed)
Patient transported to MRI 

## 2016-06-12 NOTE — Discharge Instructions (Signed)
As discussed, it is important that he follow up with her orthopedist for ongoing management of your back issues. In addition to this appointment, he may consider additional therapy, chiropractic and physiotherapy.  Below is the interpretation of today's MRI scan.  FINDINGS: Segmentation: The lowest lumbar type non-rib-bearing vertebra is labeled as L5.  Alignment:  No vertebral subluxation is observed.  Vertebrae: Type 2 degenerative endplate findings at 075-GRM with loss of disc height and disc desiccation at this level. Mild disc desiccation at L4-5.  Conus medullaris: Extends to the L1 level and appears normal.  Paraspinal and other soft tissues: Unremarkable  Disc levels:  L1-2: Unremarkable.  L2-3:  Unremarkable.  L3-4:  Unremarkable  L4-5: Mild bilateral subarticular lateral recess stenosis with borderline right foraminal stenosis and borderline central narrowing of the thecal sac due to central disc protrusion and disc bulge.  L5-S1: Mild left subarticular lateral recess stenosis and mild central narrowing of the thecal sac due to a central disc extrusion which extends cephalad and abuts the left S1 nerve in the lateral recess. There is some epidural fibrosis in the left lateral recess with prior left laminectomy. Underlying disc bulge noted.  IMPRESSION: 1. Degenerative disc disease resulting in mild impingement at the L4-5 and L5-S1 levels as detailed above.

## 2016-06-12 NOTE — ED Notes (Signed)
Family at bedside. 

## 2016-06-12 NOTE — ED Provider Notes (Signed)
[facility] DEPT Provider Note   CSN: [id number] Arrival date & time: 06/12/16  0913     History   Chief Complaint Chief Complaint  Patient presents with  . Back Pain    HPI Micheal Roth is a 40 y.o. male.  HPI  Patient presents with concern of back pain. Patient has a known history of disc disease, had one operative procedure 10 years ago, but had been doing generally well until 2 days ago. He has baseline mild neuropathy like symptoms in the left leg. However, patient states that since that event 2 days ago, during which he had audible pop, while moving something heavy, he has had worsening paresthesia, weakness in the left leg. Symptoms are worse with attempts to bear weight, walk. No relief with ibuprofen, Tylenol. No new incontinence, abdominal pain, fever, chills, vomiting, diarrhea. Patient has not seen his neurosurgeon in some time.   Past Medical History:  Diagnosis Date  . Chronic back pain   . Chronic left shoulder pain   . Chronic neck pain   . Depression   . Migraines   . Paresthesia of left arm and leg    and weakness of left arm and leg  . Seizures [facility])     Patient Active Problem List   Diagnosis Date Noted  . h/o Seizures ([place]) 04/26/2016  . History of many Head concussions 04/26/2016  . h/o Chronic neck pain 04/26/2016  . History of shingles 04/26/2016  . Neuralgia, postherpetic- R L Ext 04/26/2016  . ?able Drug-seeking behavior 04/26/2016  . Edema of right lower extremity 04/26/2016  . h/o Weakness of left arm 07/14/2013  . h/o Paresthesia of left arm 07/14/2013  . h/o Neck pain on left side 03/17/2013  . h/o Pain in joint, R shoulder region 03/17/2013  . Acute confusional state 12/18/2012  . h/o TOBACCO USER 05/10/2009  . h/o Migraine 02/15/2009  . h/o chronic LOW BACK PAIN 07/10/2008  . Adjustment disorder with mixed anxiety and depressed mood 07/04/2007    Past Surgical History:  Procedure Laterality Date  . BACK SURGERY     . KNEE SURGERY         Home Medications    Prior to Admission medications   Medication Sig Start Date End Date Taking? Authorizing Provider  acetaminophen (TYLENOL) 500 MG tablet Take 2,000 mg by mouth every 4 (four) hours as needed for mild pain.     Historical Provider, MD  amitriptyline (ELAVIL) 25 MG tablet 1-2 as needed at night for pain 04/26/16   Mellody Dance, DO  gabapentin (NEURONTIN) 300 MG capsule Take 1 capsule (300 mg total) by mouth 3 (three) times daily. After 1 wk- take 1in am, 1 in afternoon and 2 PM. After another week- take 1 in am, 2 afternoon and 2 in pm's 04/26/16   Mellody Dance, DO  ibuprofen (ADVIL,MOTRIN) 200 MG tablet Take 800 mg by mouth every 6 (six) hours as needed for moderate pain.    Historical Provider, MD  omeprazole (PRILOSEC) 10 MG capsule Take 10 mg by mouth daily as needed (for heartburn or acid reflux).     Historical Provider, MD    Family History Family History  Problem Relation Age of Onset  . Diabetes Father   . Diabetes Maternal Grandmother   . Heart disease Maternal Grandmother     CHF  . Heart attack Maternal Grandmother     Social History Social History  Substance Use Topics  . Smoking status: Former Smoker  Types: Cigarettes    Quit date: 09/04/2013  . Smokeless tobacco: Never Used  . Alcohol use No     Allergies   Review of patient's allergies indicates no known allergies.   Review of Systems Review of Systems  Constitutional:       Per HPI, otherwise negative  HENT:       Per HPI, otherwise negative  Respiratory:       Per HPI, otherwise negative  Cardiovascular:       Per HPI, otherwise negative  Gastrointestinal: Negative for vomiting.  Endocrine:       Negative aside from HPI  Genitourinary:       Neg aside from HPI   Musculoskeletal:       Per HPI, otherwise negative  Skin: Negative.   Neurological: Positive for weakness and numbness. Negative for syncope.     Physical Exam Updated Vital  Signs BP 136/72 (BP Location: Left Arm)   Pulse 84   Temp 97.7 F (36.5 C) (Oral)   Resp 18   Ht 6' (1.829 m)   Wt 208 lb (94.3 kg)   SpO2 99%   BMI 28.21 kg/m   Physical Exam  Constitutional: He is oriented to person, place, and time. He appears well-developed. No distress.  HENT:  Head: Normocephalic and atraumatic.  Eyes: Conjunctivae and EOM are normal.  Cardiovascular: Normal rate and regular rhythm.   Pulmonary/Chest: Effort normal. No stridor. No respiratory distress.  Abdominal: He exhibits no distension.  Musculoskeletal: He exhibits no edema.  Patient unwilling to flex the left hip secondary to pain Pain in the left low back with passive leg elevation bilaterally. Distal range of motion, ankles appropriate. Patient unwilling to flex the knee secondary to pain.   Neurological: He is alert and oriented to person, place, and time.  Sensation intact throughout both legs, motor strength exam limited secondary to pain in the proximal left lower leg, but ankle strength 5/5 bilaterally  Skin: Skin is warm and dry.  Psychiatric: He has a normal mood and affect.  Nursing note and vitals reviewed.    ED Treatments / Results   Radiology Mr Lumbar Spine Wo Contrast  Result Date: 06/12/2016 CLINICAL DATA:  Low back pain with numbness in the left leg, symptoms started on Friday. EXAM: MRI LUMBAR SPINE WITHOUT CONTRAST TECHNIQUE: Multiplanar, multisequence MR imaging of the lumbar spine was performed. No intravenous contrast was administered. COMPARISON:  12/21/2014 FINDINGS: Segmentation: The lowest lumbar type non-rib-bearing vertebra is labeled as L5. Alignment:  No vertebral subluxation is observed. Vertebrae: Type 2 degenerative endplate findings at 075-GRM with loss of disc height and disc desiccation at this level. Mild disc desiccation at L4-5. Conus medullaris: Extends to the L1 level and appears normal. Paraspinal and other soft tissues: Unremarkable Disc levels: L1-2:  Unremarkable. L2-3:  Unremarkable. L3-4:  Unremarkable L4-5: Mild bilateral subarticular lateral recess stenosis with borderline right foraminal stenosis and borderline central narrowing of the thecal sac due to central disc protrusion and disc bulge. L5-S1: Mild left subarticular lateral recess stenosis and mild central narrowing of the thecal sac due to a central disc extrusion which extends cephalad and abuts the left S1 nerve in the lateral recess. There is some epidural fibrosis in the left lateral recess with prior left laminectomy. Underlying disc bulge noted. IMPRESSION: 1. Degenerative disc disease resulting in mild impingement at the L4-5 and L5-S1 levels as detailed above. Electronically Signed   By: Van Clines M.D.   On: 06/12/2016 12:02  Procedures Procedures (including critical care time)  Medications Ordered in ED Medications  ketorolac (TORADOL) 30 MG/ML injection 30 mg (not administered)  LORazepam (ATIVAN) tablet 1 mg (not administered)  Chart review notable for prior arthritic evaluation, operative procedure 10 years ago. Initial Impression / Assessment and Plan / ED Course  I have reviewed the triage vital signs and the nursing notes.  Pertinent labs & imaging results that were available during my care of the patient were reviewed by me and considered in my medical decision making (see chart for details).  Clinical Course    12:13 PM Following provision of analgesia, the patient is ambulatory, though with an uncomfortable appearing gait. I reviewed all findings with the patient and his companion. Patient will follow up with orthopedics. Discharge with anti-inflammatories   Patient with known history back disease presents with new left lower extremity paresthesia, worsening pain, refractory to home medication. Given his description of inability to walk, patient had MRI performed. This did demonstrate lumbar spine disease. Patient had reduction in symptoms  with analgesia here, was discharged with stable condition to follow-up with his orthopedist.  Final Clinical Impressions(s) / ED Diagnoses   Final diagnoses:  Paresthesia of left lower extremity  Low back pain    Carmin Muskrat, MD 06/12/16 1214

## 2016-06-12 NOTE — ED Notes (Signed)
MD at bedside. EDP [place] PRESENT 

## 2016-11-20 ENCOUNTER — Encounter (HOSPITAL_COMMUNITY): Payer: Self-pay | Admitting: Emergency Medicine

## 2016-11-20 ENCOUNTER — Emergency Department (HOSPITAL_COMMUNITY): Payer: Self-pay

## 2016-11-20 ENCOUNTER — Emergency Department (HOSPITAL_COMMUNITY)
Admission: EM | Admit: 2016-11-20 | Discharge: 2016-11-20 | Disposition: A | Discharge status: Home / Self Care | Payer: Self-pay | Attending: Emergency Medicine | Admitting: Emergency Medicine

## 2016-11-20 DIAGNOSIS — Y939 Activity, unspecified: Secondary | ICD-10-CM | POA: Insufficient documentation

## 2016-11-20 DIAGNOSIS — Z87891 Personal history of nicotine dependence: Secondary | ICD-10-CM | POA: Insufficient documentation

## 2016-11-20 DIAGNOSIS — S60011A Contusion of right thumb without damage to nail, initial encounter: Secondary | ICD-10-CM | POA: Insufficient documentation

## 2016-11-20 DIAGNOSIS — M79644 Pain in right finger(s): Secondary | ICD-10-CM

## 2016-11-20 DIAGNOSIS — Z79899 Other long term (current) drug therapy: Secondary | ICD-10-CM | POA: Insufficient documentation

## 2016-11-20 DIAGNOSIS — W228XXA Striking against or struck by other objects, initial encounter: Secondary | ICD-10-CM | POA: Insufficient documentation

## 2016-11-20 DIAGNOSIS — Y999 Unspecified external cause status: Secondary | ICD-10-CM | POA: Insufficient documentation

## 2016-11-20 DIAGNOSIS — Y9289 Other specified places as the place of occurrence of the external cause: Secondary | ICD-10-CM | POA: Insufficient documentation

## 2016-11-20 NOTE — ED Triage Notes (Signed)
Pt hit R base of thumb on doorway a few days ago. Since then has had pain and swelling at base of thumb

## 2016-11-20 NOTE — Discharge Instructions (Signed)
Please read attached information. If you experience any new or worsening signs or symptoms please return to the emergency room for evaluation. Please follow-up with your primary care provider or specialist as discussed.  °

## 2016-11-20 NOTE — ED Notes (Signed)
Ortho Tech paged for thumb spica application.

## 2016-11-20 NOTE — ED Provider Notes (Signed)
[facility] DEPT Provider Note   CSN: [id number] Arrival date & time: 11/20/16  1006  By signing my name below, I, [name], attest that this documentation has been prepared under the direction and in the presence of [employer], PA-C. Electronically Signed: [name], Scribe. 11/20/16. 2:26 PM.  History   Chief Complaint Chief Complaint  Patient presents with  . Hand Pain    The history is provided by the patient. No language interpreter was used.     HPI Comments: Micheal Roth is a 41 y.o. male who presents to the Emergency Department complaining of constant, unchanged right hand pain that began a few days ago. Patient states he was attempting to close a door when his hand accidentally struck the door frame, injuring him near the base of the right thumb. He reports associated swelling that is gradually improving but now he notices bruising.     Past Medical History:  Diagnosis Date  . Chronic back pain   . Chronic left shoulder pain   . Chronic neck pain   . Depression   . Migraines   . Paresthesia of left arm and leg    and weakness of left arm and leg  . Seizures [facility])     Patient Active Problem List   Diagnosis Date Noted  . h/o Seizures ([place]) 04/26/2016  . History of many Head concussions 04/26/2016  . h/o Chronic neck pain 04/26/2016  . History of shingles 04/26/2016  . Neuralgia, postherpetic- R L Ext 04/26/2016  . ?able Drug-seeking behavior 04/26/2016  . Edema of right lower extremity 04/26/2016  . h/o Weakness of left arm 07/14/2013  . h/o Paresthesia of left arm 07/14/2013  . h/o Neck pain on left side 03/17/2013  . h/o Pain in joint, R shoulder region 03/17/2013  . Acute confusional state 12/18/2012  . h/o TOBACCO USER 05/10/2009  . h/o Migraine 02/15/2009  . h/o chronic LOW BACK PAIN 07/10/2008  . Adjustment disorder with mixed anxiety and depressed mood 07/04/2007    Past Surgical History:  Procedure Laterality Date  . BACK  SURGERY    . KNEE SURGERY         Home Medications    Prior to Admission medications   Medication Sig Start Date End Date Taking? Authorizing Provider  omeprazole (PRILOSEC) 10 MG capsule Take 10 mg by mouth daily as needed (for heartburn or acid reflux).     Historical Provider, MD  ranitidine (ZANTAC) 150 MG tablet Take 150 mg by mouth 2 (two) times daily as needed for heartburn.    Historical Provider, MD    Family History Family History  Problem Relation Age of Onset  . Diabetes Father   . Diabetes Maternal Grandmother   . Heart disease Maternal Grandmother     CHF  . Heart attack Maternal Grandmother     Social History Social History  Substance Use Topics  . Smoking status: Former Smoker    Types: Cigarettes    Quit date: 09/04/2013  . Smokeless tobacco: Never Used  . Alcohol use No     Allergies   Patient has no known allergies.   Review of Systems Review of Systems  Musculoskeletal: Positive for arthralgias.  Skin: Negative for rash.  Neurological: Negative for numbness.     Physical Exam Updated Vital Signs BP 118/72 (BP Location: Right Arm)   Pulse (!) 108   Temp 98.4 F (36.9 C) (Oral)   Ht 6' (1.829 m)   Wt 99.8 kg  SpO2 98%   BMI 29.84 kg/m   Physical Exam  Constitutional: He is oriented to person, place, and time. He appears well-developed and well-nourished.  HENT:  Head: Normocephalic and atraumatic.  Cardiovascular: Normal rate.   Pulmonary/Chest: Effort normal.  Musculoskeletal:  Small superficial bruise to the right lateral thumb.  Full active range of motion no swelling or edema  Neurological: He is alert and oriented to person, place, and time.  Skin: Skin is warm and dry.  Psychiatric: He has a normal mood and affect.  Nursing note and vitals reviewed.    ED Treatments / Results  DIAGNOSTIC STUDIES: Oxygen Saturation is 98% on RA, normal by my interpretation.    COORDINATION OF CARE: 2:26 PM Discussed treatment plan  with pt at bedside and pt agreed to plan.   Labs (all labs ordered are listed, but only abnormal results are displayed) Labs Reviewed - No data to display  EKG  EKG Interpretation None       Radiology Dg Hand Complete Right  Result Date: 11/20/2016 CLINICAL DATA:  Hand injury several days ago. EXAM: RIGHT HAND - COMPLETE 3+ VIEW COMPARISON:  None. FINDINGS: There is no evidence of fracture or dislocation. There is no evidence of arthropathy or other focal bone abnormality. Soft tissues are unremarkable. IMPRESSION: Negative. Electronically Signed   By: Franchot Gallo M.D.   On: 11/20/2016 11:58    Procedures Procedures (including critical care time)  Medications Ordered in ED Medications - No data to display   Initial Impression / Assessment and Plan / ED Course  I have reviewed the triage vital signs and the nursing notes.  Pertinent labs & imaging results that were available during my care of the patient were reviewed by me and considered in my medical decision making (see chart for details).     41 year old male presents today with thumb pain.  He has very minimal signs of trauma, full active range of motion no significant findings on plain films.  Patient placed in a thumb spica and discharged with symptomatic care instructions  Final Clinical Impressions(s) / ED Diagnoses   Final diagnoses:  Pain of right thumb   I personally performed the services described in this documentation, which was scribed in my presence. The recorded information has been reviewed and is accurate.  New Prescriptions Discharge Medication List as of 11/20/2016 12:26 PM     I personally performed the services described in this documentation, which was scribed in my presence. The recorded information has been reviewed and is accurate.    [name], PA-C 11/20/16 [place], PA-C 11/20/16 1426    Tanna Furry, MD 11/22/16 [id number]

## 2017-02-14 LAB — CBC AND DIFFERENTIAL
HCT: 46 (ref 41–53)
Hemoglobin: 15.4 (ref 13.5–17.5)
PLATELETS: 253 (ref 150–399)
WBC: 8.2

## 2017-02-14 LAB — BASIC METABOLIC PANEL
BUN: 18 (ref 4–21)
CREATININE: 1 (ref 0.6–1.3)
GLUCOSE: 114
Potassium: 4.2 (ref 3.4–5.3)
Sodium: 138 (ref 137–147)

## 2017-02-14 LAB — HEPATIC FUNCTION PANEL
ALT: 45 — AB (ref 10–40)
AST: 40 (ref 14–40)
Alkaline Phosphatase: 53 (ref 25–125)
Bilirubin, Total: 0.7

## 2017-02-15 LAB — HEPATIC FUNCTION PANEL
ALT: 47 — AB (ref 10–40)
AST: 36 (ref 14–40)
Alkaline Phosphatase: 48 (ref 25–125)
BILIRUBIN, TOTAL: 0.9

## 2017-02-15 LAB — CBC AND DIFFERENTIAL
HEMATOCRIT: 45 (ref 41–53)
PLATELETS: 243 (ref 150–399)
WBC: 6.9

## 2017-02-15 LAB — BASIC METABOLIC PANEL
BUN: 17 (ref 4–21)
Creatinine: 1.1 (ref 0.6–1.3)
GLUCOSE: 98
Potassium: 4.1 (ref 3.4–5.3)
SODIUM: 137 (ref 137–147)

## 2017-02-22 ENCOUNTER — Ambulatory Visit (INDEPENDENT_AMBULATORY_CARE_PROVIDER_SITE_OTHER): Payer: Self-pay | Admitting: Family Medicine

## 2017-02-22 ENCOUNTER — Encounter: Payer: Self-pay | Admitting: Family Medicine

## 2017-02-22 VITALS — BP 130/87 | HR 67 | Ht 69.75 in | Wt 222.6 lb

## 2017-02-22 DIAGNOSIS — R1013 Epigastric pain: Secondary | ICD-10-CM

## 2017-02-22 DIAGNOSIS — E669 Obesity, unspecified: Secondary | ICD-10-CM

## 2017-02-22 DIAGNOSIS — F4323 Adjustment disorder with mixed anxiety and depressed mood: Secondary | ICD-10-CM

## 2017-02-22 DIAGNOSIS — R12 Heartburn: Secondary | ICD-10-CM

## 2017-02-22 DIAGNOSIS — E66811 Obesity, class 1: Secondary | ICD-10-CM | POA: Insufficient documentation

## 2017-02-22 DIAGNOSIS — F1210 Cannabis abuse, uncomplicated: Secondary | ICD-10-CM

## 2017-02-22 NOTE — Progress Notes (Signed)
Pt here for an acute care OV today   Impression and Recommendations:      1. Epigastric abdominal pain   2. Chronic heartburn   3. Adjustment disorder with mixed anxiety and depressed mood   4. Obesity, Class I, BMI 30-34.9   5. Drug abuse, marijuana     Patient is self-pay; so I discussed with him he might want to look into seeking out cheaper cash price clinics in the area.  Adjustment disorder with mixed anxiety and depressed mood Patient will follow up in near future for fasting blood work as well as to discuss his mood/ depression and recent increase in stressful life events.  No SI     The patient was counseled, risk factors were discussed, anticipatory guidance given.  New Prescriptions   No medications on file    Discontinued Medications   RANITIDINE (ZANTAC) 150 MG TABLET    Take 150 mg by mouth 2 (two) times daily as needed for heartburn.     No orders of the defined types were placed in this encounter.    Gross side effects, risk and benefits, and alternatives of medications and treatment plan in general discussed with patient.  Patient is aware that all medications have potential side effects and we are unable to predict every side effect or drug-drug interaction that may occur.   Patient will call with any questions prior to using medication if they have concerns.  Expresses verbal understanding and consents to current therapy and treatment regimen.  No barriers to understanding were identified.  Red flag symptoms and signs discussed in detail.  Patient expressed understanding regarding what to do in case of emergency\urgent symptoms  Please see AVS handed out to patient at the end of our visit for further patient instructions/ counseling done pertaining to today's office visit.   Return for near future for fasting bldwrk and then f/up with me 3-4 wks.     Note: This document was prepared using Dragon voice recognition software and may include  unintentional dictation errors.  Masae Lukacs 11:32 AM --------------------------------------------------------------------------------------------------------------------------------------------------------------------------------------------------------------------------------------------    Subjective:    CC:  Chief Complaint  Patient presents with  . Abdominal Pain    HPI: Micheal Roth is a 41 y.o. male who presents to [place] [facility] today for issues as discussed below.  Long standing abd pain- comes and goes.  And never lasts long but recently- had episode  Almost 1 week ago.   Awoke him from sleep- sharp electrution type pain in upper middle abd--> right under sternum.  Made him even vomit due to pain.  Nausea has gone away by now, but still a constant burning pain since last week- sharp episodes go on 4-5 times per day and last seconds to maybe a minute.    Not related to empty versus full stomach- not W or better with food.  NO W or better laying down versus not.   Was so bad went to ED in [place] stayed overnight--> last Wed- the morning after it started at 4am that morning.  No ABN at hosp--> ct scan abd, bldwrk, UA, MRI or neck and thoracic spine- N.  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   Gave pt viscous lidocaine, morphine, and sent home with hydrocodone, flexeril and anti-N meds.  Pain- No improvement.     Also patient has been taking 2 ibuprofen every  morning for general aches and pains - Neck and back aches to help him get through his work day, and 2-3 days per week he additionally takes doses at night. No more than 4 per day.   A lot of stress in life lately- GF and he have been together 14 yrs and car broke down, hates his job, getting into arguments at work and waiting to be fired.   Quit smoking 2 yrs ago - smoked for 20 yrs at 1.5ppd at most.   30 yr hx.  Smokes marijuana  daily --> for 30-40 yrs.       Problem  Obesity, Class I, Bmi 30-34.9  Drug Abuse, Marijuana  Chronic Heartburn  Epigastric Abdominal Pain  Adjustment Disorder With Mixed Anxiety and Depressed Mood   Qualifier: Diagnosis of  By: Darylene Price MD, Lane Hacker Readings from Last 3 Encounters:  02/22/17 222 lb 9.6 oz (101 kg)  11/20/16 220 lb (99.8 kg)  06/12/16 208 lb (94.3 kg)   BP Readings from Last 3 Encounters:  02/22/17 130/87  11/20/16 118/72  06/12/16 133/73   BMI Readings from Last 3 Encounters:  02/22/17 32.17 kg/m  11/20/16 29.84 kg/m  06/12/16 28.21 kg/m     Patient Care Team    Relationship Specialty Notifications Start End  Mellody Dance, DO PCP - General Family Medicine  04/26/16      Patient Active Problem List   Diagnosis Date Noted  . Obesity, Class I, BMI 30-34.9 02/22/2017  . Drug abuse, marijuana 02/22/2017  . Chronic heartburn 02/22/2017  . Epigastric abdominal pain 02/22/2017  . h/o Seizures ([place]) 04/26/2016  . History of many Head concussions 04/26/2016  . h/o Chronic neck pain 04/26/2016  . History of shingles 04/26/2016  . Neuralgia, postherpetic- R L Ext 04/26/2016  . ?able Drug-seeking behavior 04/26/2016  . Edema of right lower extremity 04/26/2016  . h/o Weakness of left arm 07/14/2013  . h/o Paresthesia of left arm 07/14/2013  . h/o Neck pain on left side 03/17/2013  . h/o Pain in joint, R shoulder region 03/17/2013  . Acute confusional state 12/18/2012  . h/o TOBACCO USER 05/10/2009  . h/o Migraine 02/15/2009  . h/o chronic LOW BACK PAIN 07/10/2008  . Adjustment disorder with mixed anxiety and depressed mood 07/04/2007    Past Medical history, Surgical history, Family history, Social history, Allergies and Medications have been entered into the medical record, reviewed and changed as needed.    Current Meds  Medication Sig  . cyclobenzaprine (FLEXERIL) 10 MG tablet Take 10 mg by mouth 3 (three) times daily as needed  for muscle spasms.  Marland Kitchen HYDROcodone-acetaminophen (NORCO/VICODIN) 5-325 MG tablet Take 1 tablet by mouth every 6 (six) hours as needed for moderate pain.  Marland Kitchen omeprazole (PRILOSEC) 10 MG capsule Take 10 mg by mouth daily as needed (for heartburn or acid reflux).   . ondansetron (ZOFRAN-ODT) 4 MG disintegrating tablet Take 4 mg by mouth every 8 (eight) hours as needed for nausea or vomiting.  . [DISCONTINUED] ranitidine (ZANTAC) 150 MG tablet Take 150 mg by mouth 2 (two) times daily as needed for heartburn.    Allergies:  No Known Allergies   Review of Systems: General:   Denies fever, chills, unexplained weight loss.  Optho/Auditory:   Denies visual changes, blurred vision/LOV Respiratory:   Denies wheeze, DOE more than baseline levels.  Cardiovascular:   Denies chest pain, palpitations, new onset peripheral edema  Gastrointestinal:   Denies nausea,  vomiting, diarrhea, abd pain.  Genitourinary: Denies dysuria, freq/ urgency, flank pain or discharge from genitals.  Endocrine:     Denies hot or cold intolerance, polyuria, polydipsia. Musculoskeletal:   Denies unexplained myalgias, joint swelling, unexplained arthralgias, gait problems.  Skin:  Denies new onset rash, suspicious lesions Neurological:     Denies dizziness, unexplained weakness, numbness  Psychiatric/Behavioral:   Denies mood changes, suicidal or homicidal ideations, hallucinations    Objective:   Blood pressure 130/87, pulse 67, height 5' 9.75" (1.772 m), weight 222 lb 9.6 oz (101 kg). Body mass index is 32.17 kg/m. General:  Well Developed, well nourished, appropriate for stated age.  Neuro:  Alert and oriented,  extra-ocular muscles intact  HEENT:  Normocephalic, atraumatic, neck supple Skin:  no gross rash, warm, pink. Cardiac:  RRR, S1 S2 Respiratory:  ECTA B/L and A/P, Not using accessory muscles, speaking in full sentences- unlabored. ABd: obese, NT, ND, No G,R,R Vascular:  Ext warm, no cyanosis apprec.; cap RF  less 2 sec. Psych:  No HI/SI, judgement and insight good, Euthymic mood. Full Affect.

## 2017-02-22 NOTE — Patient Instructions (Addendum)
DO not TAKE ANY advil / ibuprofen or alleve/ naprosyn at all.   ONly use tylenol as needed pain.  GI referral sent  INC omprazole to 20mg  twice daily;  In addition take ranitidine twice daily as well if paion to improved with increased dose of omeprazole.   Follow a gerd- prudent diet.  Also make 2 appointments when you leave today.  One for fasting blood work which is lab only, then a follow-up with me.    Food Choices for Gastroesophageal Reflux Disease, Adult When you have gastroesophageal reflux disease (GERD), the foods you eat and your eating habits are very important. Choosing the right foods can help ease the discomfort of GERD. Consider working with a diet and nutrition specialist (dietitian) to help you make healthy food choices. What general guidelines should I follow? Eating plan  Choose healthy foods low in fat, such as fruits, vegetables, whole grains, low-fat dairy products, and lean meat, fish, and poultry.  Eat frequent, small meals instead of three large meals each day. Eat your meals slowly, in a relaxed setting. Avoid bending over or lying down until 2-3 hours after eating.  Limit high-fat foods such as fatty meats or fried foods.  Limit your intake of oils, butter, and shortening to less than 8 teaspoons each day.  Avoid the following: ? Foods that cause symptoms. These may be different for different people. Keep a food diary to keep track of foods that cause symptoms. ? Alcohol. ? Drinking large amounts of liquid with meals. ? Eating meals during the 2-3 hours before bed.  Cook foods using methods other than frying. This may include baking, grilling, or broiling. Lifestyle   Maintain a healthy weight. Ask your health care provider what weight is healthy for you. If you need to lose weight, work with your health care provider to do so safely.  Exercise for at least 30 minutes on 5 or more days each week, or as told by your health care provider.  Avoid  wearing clothes that fit tightly around your waist and chest.  Do not use any products that contain nicotine or tobacco, such as cigarettes and e-cigarettes. If you need help quitting, ask your health care provider.  Sleep with the head of your bed raised. Use a wedge under the mattress or blocks under the bed frame to raise the head of the bed. What foods are not recommended? The items listed may not be a complete list. Talk with your dietitian about what dietary choices are best for you. Grains Pastries or quick breads with added fat. [place] toast. Vegetables Deep fried vegetables. [place] fries. Any vegetables prepared with added fat. Any vegetables that cause symptoms. For some people this may include tomatoes and tomato products, chili peppers, onions and garlic, and horseradish. Fruits Any fruits prepared with added fat. Any fruits that cause symptoms. For some people this may include citrus fruits, such as oranges, grapefruit, pineapple, and lemons. Meats and other protein foods High-fat meats, such as fatty beef or pork, hot dogs, ribs, ham, sausage, salami and bacon. Fried meat or protein, including fried fish and fried chicken. Nuts and nut butters. Dairy Whole milk and chocolate milk. Sour cream. Cream. Ice cream. Cream cheese. Milk shakes. Beverages Coffee and tea, with or without caffeine. Carbonated beverages. Sodas. Energy drinks. Fruit juice made with acidic fruits (such as orange or grapefruit). Tomato juice. Alcoholic drinks. Fats and oils Butter. Margarine. Shortening. Ghee. Sweets and desserts Chocolate and cocoa. Donuts. Seasoning and other  foods Pepper. Peppermint and spearmint. Any condiments, herbs, or seasonings that cause symptoms. For some people, this may include curry, hot sauce, or vinegar-based salad dressings. Summary  When you have gastroesophageal reflux disease (GERD), food and lifestyle choices are very important to help ease the discomfort of  GERD.  Eat frequent, small meals instead of three large meals each day. Eat your meals slowly, in a relaxed setting. Avoid bending over or lying down until 2-3 hours after eating.  Limit high-fat foods such as fatty meat or fried foods. This information is not intended to replace advice given to you by your health care provider. Make sure you discuss any questions you have with your health care provider. Document Released: 08/21/2005 Document Revised: 08/22/2016 Document Reviewed: 08/22/2016 Elsevier Interactive Patient Education  2017 [employer]      Peptic Ulcer A peptic ulcer is a sore in the lining of the esophagus (esophageal ulcer), the stomach (gastric ulcer), or the first part of the small intestine (duodenal ulcer). The ulcer causes gradual wearing away (erosion) into the deeper tissue. What are the causes? Normally, the lining of the stomach and the small intestine protects itself from the acid that digests food. The protective lining can be damaged by:  An infection caused by a germ (bacterium) called Helicobacter pylori or H. pylori.  Regular use of NSAIDs, such as ibuprofen or aspirin.  Rare tumors in the stomach, small intestine, or pancreas (Zollinger-Ellison syndrome).  What increases the risk? The following factors may make you more likely to develop this condition:  Smoking.  Having a family history of ulcer disease.  What are the signs or symptoms? Symptoms of this condition include:  Burning pain or gnawing in the area between the chest and the belly button. The pain may be worse on an empty stomach and at night.  Heartburn.  Nausea and vomiting.  Bloating.  If the ulcer results in bleeding, it can cause:  Black, tarry stools.  Vomiting of bright red blood.  Vomiting of material that looks like coffee grounds.  How is this diagnosed? This condition may be diagnosed based on:  Medical history and physical exam.  Various tests or  procedures, such as: ? Blood tests, stool tests, or breath tests to check for the H. pylori bacterium. ? An X-ray exam (upper gastrointestinal series) of the esophagus, stomach, and small intestine. ? Upper endoscopy. The health care provider examines the esophagus, stomach, and small intestine using a small flexible tube that has a video camera at the end. ? Biopsy. A tissue sample is removed to be examined under a microscope.  How is this treated? Treatment for this condition may include:  Eliminating the cause of the ulcer, such as smoking or the use of NSAIDs or alcohol.  Medicines to reduce the amount of acid in your digestive tract.  Antibiotic medicines, if the ulcer is caused by the H. pylori bacterium.  An upper endoscopy to treat a bleeding ulcer.  Surgery, if the bleeding is severe or if the ulcer created a hole somewhere in the digestive system.  Follow these instructions at home:  Avoid alcohol and caffeine.  Do not use any tobacco products, such as cigarettes, chewing tobacco, and e-cigarettes. If you need help quitting, ask your health care provider.  Take over-the-counter and prescription medicines only as told by your health care provider. Do not use over-the-counter medicines in place of prescription medicines unless your health care provider approves.  Keep all follow-up visits as told  by your health care provider. This is important. Contact a health care provider if:  Your symptoms do not improve within 7 days of starting treatment.  You have ongoing indigestion or heartburn. Get help right away if:  You have sudden, sharp, or persistent pain in your abdomen.  You have bloody or dark black, tarry stools.  You vomit blood or material that looks like coffee grounds.  You become light-headed or you feel faint.  You become weak.  You become sweaty or clammy. This information is not intended to replace advice given to you by your health care provider. Make  sure you discuss any questions you have with your health care provider. Document Released: 08/18/2000 Document Revised: 01/24/2016 Document Reviewed: 05/22/2015 Elsevier Interactive Patient Education  [employer].

## 2017-02-22 NOTE — Assessment & Plan Note (Signed)
Patient will follow up in near future for fasting blood work as well as to discuss his mood/ depression and recent increase in stressful life events.  No SI

## 2017-02-23 ENCOUNTER — Other Ambulatory Visit (INDEPENDENT_AMBULATORY_CARE_PROVIDER_SITE_OTHER): Payer: Self-pay

## 2017-02-23 DIAGNOSIS — E669 Obesity, unspecified: Secondary | ICD-10-CM

## 2017-02-23 DIAGNOSIS — Z131 Encounter for screening for diabetes mellitus: Secondary | ICD-10-CM

## 2017-02-23 DIAGNOSIS — Z13220 Encounter for screening for lipoid disorders: Secondary | ICD-10-CM

## 2017-02-23 DIAGNOSIS — E66811 Obesity, class 1: Secondary | ICD-10-CM

## 2017-02-24 LAB — LIPID PANEL
CHOL/HDL RATIO: 4.8 ratio (ref 0.0–5.0)
Cholesterol, Total: 177 mg/dL (ref 100–199)
HDL: 37 mg/dL — AB (ref >39)
LDL Calculated: 105 mg/dL — ABNORMAL HIGH (ref 0–99)
TRIGLYCERIDES: 175 mg/dL — AB (ref 0–149)
VLDL CHOLESTEROL CAL: 35 mg/dL (ref 5–40)

## 2017-02-24 LAB — HEMOGLOBIN A1C
Est. average glucose Bld gHb Est-mCnc: 108 mg/dL
HEMOGLOBIN A1C: 5.4 % (ref 4.8–5.6)

## 2017-03-22 ENCOUNTER — Ambulatory Visit (INDEPENDENT_AMBULATORY_CARE_PROVIDER_SITE_OTHER): Payer: Self-pay | Admitting: Family Medicine

## 2017-03-22 ENCOUNTER — Encounter: Payer: Self-pay | Admitting: Family Medicine

## 2017-03-22 VITALS — BP 123/85 | HR 82 | Ht 69.75 in | Wt 222.8 lb

## 2017-03-22 DIAGNOSIS — M791 Myalgia, unspecified site: Secondary | ICD-10-CM | POA: Insufficient documentation

## 2017-03-22 DIAGNOSIS — E782 Mixed hyperlipidemia: Secondary | ICD-10-CM | POA: Insufficient documentation

## 2017-03-22 DIAGNOSIS — R12 Heartburn: Secondary | ICD-10-CM

## 2017-03-22 DIAGNOSIS — F1210 Cannabis abuse, uncomplicated: Secondary | ICD-10-CM

## 2017-03-22 DIAGNOSIS — M62838 Other muscle spasm: Secondary | ICD-10-CM

## 2017-03-22 DIAGNOSIS — E781 Pure hyperglyceridemia: Secondary | ICD-10-CM

## 2017-03-22 DIAGNOSIS — E669 Obesity, unspecified: Secondary | ICD-10-CM

## 2017-03-22 DIAGNOSIS — R748 Abnormal levels of other serum enzymes: Secondary | ICD-10-CM

## 2017-03-22 DIAGNOSIS — R1013 Epigastric pain: Secondary | ICD-10-CM

## 2017-03-22 DIAGNOSIS — E786 Lipoprotein deficiency: Secondary | ICD-10-CM

## 2017-03-22 MED ORDER — CYCLOBENZAPRINE HCL 10 MG PO TABS: 10.0000 mg | ORAL_TABLET | Freq: Three times a day (TID) | ORAL | 0 refills | Status: DC | PRN

## 2017-03-22 NOTE — Patient Instructions (Addendum)
Take 2 of the 20mg  omeprazole daily---> one in the am and one in the pm daily for 8wks.  Then go to once daily.  You may add a cimetidine or ranitidine to that daily regimen as well if your symptoms are completely resolved.     Please realize, EXERCISE IS MEDICINE!  -  American Heart Association [facility]) guidelines for exercise : If you are in good health, without any medical conditions, you should engage in 150 minutes of moderate intensity aerobic activity per week.  This means you should be huffing and puffing throughout your workout.   Engaging in regular exercise will improve brain function and memory, as well as improve mood, boost immune system and help with weight management.  As well as the other, more well-known effects of exercise such as decreasing blood sugar levels, decreasing blood pressure,  and decreasing bad cholesterol levels/ increasing good cholesterol levels.     -  The AHA strongly endorses consumption of a diet that contains a variety of foods from all the food categories with an emphasis on fruits and vegetables; fat-free and low-fat dairy products; cereal and grain products; legumes and nuts; and fish, poultry, and/or extra lean meats.    Excessive food intake, especially of foods high in saturated and trans fats, sugar, and salt, should be avoided.    Adequate water intake of roughly 1/2 of your weight in pounds, should equal the ounces of water per day you should drink.  So for instance, if you're 200 pounds, that would be 100 ounces of water per day.  You should be drinking at least 7 bottles of water per day and if you're outside sweating it should be way more than that     Guidelines for a Low Cholesterol, Low Saturated Fat Diet   Fats - Limit total intake of fats and oils. - Avoid butter, stick margarine, shortening, lard, palm and coconut oils. - Limit mayonnaise, salad dressings, gravies and sauces, unless they are homemade with low-fat ingredients. - Limit  chocolate. - Choose low-fat and nonfat products, such as low-fat mayonnaise, low-fat or non-hydrogenated peanut butter, low-fat or fat-free salad dressings and nonfat gravy. - Use vegetable oil, such as canola or olive oil. - Look for margarine that does not contain trans fatty acids. - Use nuts in moderate amounts. - Read ingredient labels carefully to determine both amount and type of fat present in foods. Limit saturated and trans fats! - Avoid high-fat processed and convenience foods.  Meats and Meat Alternatives - Choose fish, chicken, [place] and lean meats. - Use dried beans, peas, lentils and tofu. - Limit egg yolks to three to four per week. - If you eat red meat, limit to no more than three servings per week and choose loin or round cuts. - Avoid fatty meats, such as bacon, sausage, franks, luncheon meats and ribs. - Avoid all organ meats, including liver.  Dairy - Choose nonfat or low-fat milk, yogurt and cottage cheese. - Most cheeses are high in fat. Choose cheeses made from non-fat milk, such as mozzarella and ricotta cheese. - Choose light or fat-free cream cheese and sour cream. - Avoid cream and sauces made with cream.  Fruits and Vegetables - Eat a wide variety of fruits and vegetables. - Use lemon juice, vinegar or "mist" olive oil on vegetables. - Avoid adding sauces, fat or oil to vegetables.  Breads, Cereals and Grains - Choose whole-grain breads, cereals, pastas and rice. - Avoid high-fat snack foods, such  as granola, cookies, pies, pastries, doughnuts and croissants.  Cooking Tips - Avoid deep fried foods. - Trim visible fat off meats and remove skin from poultry before cooking. - Bake, broil, boil, poach or roast poultry, fish and lean meats. - Drain and discard fat that drains out of meat as you cook it. - Add little or no fat to foods. - Use vegetable oil sprays to grease pans for cooking or baking. - Steam vegetables. - Use herbs or no-oil marinades  to flavor foods.  The quick and dirty--> lower triglyceride levels more through...  1) - Beware of bad fats: Cutting back on saturated fat (in red meat and full-fat dairy foods) and trans fats (in restaurant fried foods and commercially prepared baked goods) can lower triglycerides.  2) - Go for good carbs: Easily digested carbohydrates (such as white bread, white rice, cornflakes, and sugary sodas) give triglycerides a definite boost.   3) - Eating whole grains and cutting back on soda can help control triglycerides.  4) - Check your alcohol use. In some people, alcohol dramatically boosts triglycerides. The only way to know if this is true for you is to avoid alcohol for a few weeks and have your triglycerides tested again.  5) - Go fish. Omega-3 fats in salmon, tuna, sardines, and other fatty fish can lower triglycerides. Having fish twice a week is fine.  6) - Aim for a healthy weight. If you are overweight, losing just 5% to 10% of your weight can help drive down triglycerides.  7) - Get moving. Exercise lowers triglycerides and boosts heart-healthy HDL cholesterol.  8) - quit smoking if you do  --> for more information, see below; or go to  www.heart.org  and do a search for desired topics   For those diagnosed with high triglycerides, it's important to take action to lower your levels and improve your heart health.  Triglyceride is just a fancy word for fat - the fat in our bodies is stored in the form of triglycerides. Triglycerides are found in foods and manufactured in our bodies.  Normal triglyceride levels are defined as less than 150 mg/dL; 150 to 199 is considered borderline high; 200 to 499 is high; and 500 or higher is officially called very high. To me, anything over 150 is a red flag indicating my patient needs to take immediate steps to get the situation under control.   What is the significance of high triglycerides? High triglyceride levels make blood thicker and  stickier, which means that it is more likely to form clots. Studies have shown that triglyceride levels are associated with increased risks of cardiovascular disease and stroke - in both men and women - alone or in combination with other risk factors (high triglycerides combined with high LDL cholesterol can be a particularly deadly combination). For example, in one ground-breaking study, high triglycerides alone increased the risk of cardiovascular disease by 14 percent in men, and by 34 percent in women. But when the test subjects also had low HDL cholesterol (that's the good cholesterol) and other risk factors, high triglycerides increased the risk of disease by 32 percent in men and 76 percent in women.   Fortunately, triglycerides can sometimes be controlled with several diet and lifestyle changes.    What Factors Can Increase Triglycerides? As with cholesterol, eating too much of the wrong kinds of fats will raise your blood triglycerides.  Therefore, it's important to restrict the amounts of saturated fats and trans fats you allow  into your diet.  Triglyceride levels can also shoot up after eating foods that are high in carbohydrates or after drinking alcohol.  That's why triglyceride blood tests require an overnight fast.  If you have elevated triglycerides, it's especially important to avoid sugary and refined carbohydrates, including sugar, honey, and other sweeteners, soda and other sugary drinks, candy, baked goods, and anything made with white (refined or enriched) flour, including white bread, rolls, cereals, buns, pastries, regular pasta, and white rice.  You'll also want to limit dried fruit and fruit juice since they're dense in simple sugar.  All of these low-quality carbs cause a sudden rise in insulin, which may lead to a spike in triglycerides.  Triglycerides can also become elevated as a reaction to having diabetes, hypothyroidism, or kidney disease. As with most other heart-related  factors, being overweight and inactive also contribute to abnormal triglycerides. And unfortunately, some people have a genetic predisposition that causes them to manufacture way too much triglycerides on their own, no matter how carefully they eat.     How Can You Lower Your Triglyceride Levels? If you are diagnosed with high triglycerides, it's important to take action. There are several things you can do to help lower your triglyceride levels and improve your heart health:  --> Lose weight if you are overweight.  There is a clear correlation between obesity and high triglycerides - the heavier people are, the higher their triglyceride levels are likely to be. The good news is that losing weight can significantly lower triglycerides. In a large study of individuals with type 2 diabetes, those assigned to the "lifestyle intervention group" - which involved counseling, a low-calorie meal plan, and customized exercise program - lost 8.6% of their body weight and lowered their triglyceride levels by more than 16%. If you're overweight, find a weight loss plan that works for you and commit to shedding the pounds and getting healthier.  --> Reduce the amount of saturated fat and trans fat in your diet.  Start by avoiding or dramatically limiting butter, cream cheese, lard, sour cream, doughnuts, cakes, cookies, candy bars, regular ice cream, fried foods, pizza, cheese sauce, cream-based sauces and salad dressings, high-fat meats (including fatty hamburgers, bologna, pepperoni, sausage, bacon, salami, pastrami, spareribs, and hot dogs), high-fat cuts of beef and pork, and whole-milk dairy products.   Other ways to cut back: Choose lean meats only (including skinless chicken and [place], lean beef, lean pork), fish, and reduced-fat or fat-free dairy products.   Experiment with adding whole soy foods to your diet. Although soy itself may not reduce risk of heart disease, it replaces hazardous animal fats  with healthier proteins. Choose high-quality soy foods, such as tofu, tempeh, soy milk, and edamame (whole soybeans).  Always remove skin from poultry.  Prepare foods by baking, roasting, broiling, boiling, poaching, steaming, grilling, or stir-frying in vegetable oil.  Most stick margarines contain trans fats, and trans fats are also found in some packaged baked goods, potato chips, snack foods, fried foods, and fast food that use or create hydrogenated oils.    (All food labels must now list the amount of trans fats, right after the amount of saturated fats - good news for consumers. As a result, many food companies have now reformulated their products to be trans fat free.many, but not all! So it's still just as important to read labels and make sure the packaged foods you buy don't contain trans fats.)     If you use margarine, purchase soft-tub  margarine spreads that contain 0 grams trans fats and don't list any partially hydrogenated oils in the ingredients list. By substituting olive oil or vegetable oil for trans fats in just 2 percent of your daily calories, you can reduce your risk of heart disease by 53 percent.   There is no safe amount of trans fats, so try to keep them as far from your plate as possible.  -->  Avoid foods that are concentrated in sugar (even dried fruit and fruit juice). Sugary foods can elevate triglyceride levels in the blood, so keep them to a bare minimum.  --> Swap out refined carbohydrates for whole grains.  Refined carbohydrates - like white rice, regular pasta, and anything made with white or "enriched" flour (including white bread, rolls, cereals, buns, and crackers) - raise blood sugar and insulin levels more than fiber-rich whole grains. Higher insulin levels, in turn, can lead to a higher rise in triglycerides after a meal. So, make the switch to whole wheat bread, whole grain pasta, brown or wild rice, and whole grain versions of cereals, crackers, and other  bread products. However, it's important to know that individuals with high triglycerides should moderate even their intake of high-quality starches (since all starches raise blood sugar) - I recommend 1 to 2 servings per meal.  --> Cut way back on alcohol.  If you have high triglycerides, alcohol should be considered a rare treat - if you indulge at all, since even small amounts of alcohol can dramatically increase triglyceride levels.  --> Incorporate omega-3 fats.  Heart-healthy fish oils are especially rich in omega-3 fatty acids. In multiple studies over the past two decades, people who ate diets high in omega-3s had 30 to 40 percent reductions in heart disease. Although we don't yet know why fish oil works so well, there are several possibilities. Omega-3s seem to reduce inflammation, reduce high blood pressure, decrease triglycerides, raise HDL cholesterol, and make blood thinner and less sticky so it is less likely to clot. It's as close to a food prescription for heart health as it gets. If you have high triglycerides, I recommend eating at least three servings of one of the omega-3-rich fish every week (fatty fish is the most concentrated food form of omega three fats). If you cannot manage to eat that much fish, speak with your physician about taking fish oil capsules, which offer similar benefits.The best foods for omega-3 fatty acids include wild salmon (fresh, canned), herring, mackerel (not king), sardines, anchovies, rainbow trout, and [place] oysters. Non-fish sources of omega-3 fats include omega-3-fortified eggs, ground flaxseed, chia seeds, walnuts, butternuts (white walnuts), seaweed, walnut oil, canola oil, and soybeans.  --> Quit smoking.  Smoking causes inflammation, not just in your lungs, but throughout your body. Inflammation can contribute to atherosclerosis, blood clots, and risk of heart attack. Smoking makes all heart health indicators worse. If you have high cholesterol, high  triglycerides, or high blood pressure, smoking magnifies the danger.  --> Become more physically active.  Even moderate exercise can help improve cholesterol, triglycerides, and blood pressure. Aerobic exercise seems to be able to stop the sharp rise of triglycerides after eating, perhaps because of a decrease in the amount of triglyceride released by the liver, or because active muscle clears triglycerides out of the blood stream more quickly than inactive muscle. If you haven't exercised regularly (or at all) for years, I recommend starting slowly, by walking at an easy pace for 15 minutes a day. Then, as you feel  more comfortable, increase the amount. Your ultimate goal should be at least 30 minutes of moderate physical activity, at least five days a week.

## 2017-03-22 NOTE — Progress Notes (Signed)
Assessment and plan:  1. Epigastric abdominal pain   2. Chronic heartburn   3. Drug abuse, marijuana   4. Obesity, Class I, BMI 30-34.9   5. Elevated triglycerides with high cholesterol   6. Low level of high density lipoprotein (HDL)   7. Hypertriglyceridemia   8. Low serum HDL   9. Myalgia   10. Muscle spasm    - Take 2 of the 20mg  omeprazole daily---> one in the am and one in the pm daily for 8wks.  Then go to once daily.  You may add a cimetidine or ranitidine to that daily regimen as well if your symptoms are completely resolved.  - Told patient try to identify trigger foods and avoid them.  Reminded patient no eating or drinking within 3 hours of lying down at night etc.etc.  - Encouraged weight loss. - Education discussion regarding triglycerides and elevated VLDL and LDL as well as low HDL.  Handouts provided. - Refill Flexeril given for his muscle spasms. - Since patient is obtaining health insurance in approximately 3 months at his work, we will hold off on GI referral until then, as well as hold off on updating his tetanus until next office visit once his insurance kicks in    Meds ordered this encounter  Medications  . DISCONTD: cyclobenzaprine (FLEXERIL) 10 MG tablet    Sig: Take 1 tablet (10 mg total) by mouth 3 (three) times daily as needed for muscle spasms.    Dispense:  30 tablet    Refill:  0  . omeprazole (PRILOSEC) 20 MG capsule    Sig: Take 20 mg by mouth daily.  . cyclobenzaprine (FLEXERIL) 10 MG tablet    Sig: Take 1 tablet (10 mg total) by mouth 3 (three) times daily as needed for muscle spasms.    Dispense:  30 tablet    Refill:  0     Discontinued Medications   OMEPRAZOLE (PRILOSEC) 10 MG CAPSULE    Take 10 mg by mouth daily as needed (for heartburn or acid reflux).       No orders of the defined types were placed in this encounter.    Return in about 4 months (around  07/23/2017) for Try to work on [employer] and living healthier lifestyle.  Anticipatory guidance and routine counseling done re: condition, txmnt options and need for follow up. All questions of patient's were answered.   Gross side effects, risk and benefits, and alternatives of medications discussed with patient.  Patient is aware that all medications have potential side effects and we are unable to predict every sideeffect or drug-drug interaction that may occur.  Expresses verbal understanding and consents to current therapy plan and treatment regiment.  Please see AVS handed out to patient at the end of our visit for additional patient instructions/ counseling done pertaining to today's office visit.  Note: This document was prepared using Dragon voice recognition software and may include unintentional dictation errors.   ----------------------------------------------------------------------------------------------------------------------  Subjective:   CC:   Micheal Roth is a 41 y.o. male who presents to [place] at [facility] today for review and discussion of recent bloodwork that was done.  1. All recent blood work that we ordered was reviewed with patient today.  Patient was counseled on all abnormalities and we discussed dietary and lifestyle changes that could help those values (also medications when appropriate).  Extensive health counseling performed and all patient's concerns/  questions were addressed.  2. Patient recently got a new job, the job is the morning for 6 months.  He had a talk with his girlfriend of over 14 years and they're doing much better.  He actually got a new car which his mother helped him by.  Things are looking up and his mood is much improved versus last time.  Stress levels in his life have significantly gone down.  Patient will be getting full medical and dental insurance after 90 days of starting his job which she is  doing the 24th of this month. 3. Still with epigastric pain- twin brother with Chrohn's Dx.  He has totally cut out soda--> only drinking water now.  more energetic.   Cutting out fried foods, more veggies/ fruit.  Trying to work on getting healthier.    Wt Readings from Last 3 Encounters:  03/22/17 222 lb 12 oz (101 kg)  02/22/17 222 lb 9.6 oz (101 kg)  11/20/16 220 lb (99.8 kg)   BP Readings from Last 3 Encounters:  03/22/17 123/85  02/22/17 130/87  11/20/16 118/72   Pulse Readings from Last 3 Encounters:  03/22/17 82  02/22/17 67  11/20/16 (!) 108   BMI Readings from Last 3 Encounters:  03/22/17 32.19 kg/m  02/22/17 32.17 kg/m  11/20/16 29.84 kg/m     Patient Care Team    Relationship Specialty Notifications Start End  Mellody Dance, DO PCP - General Family Medicine  04/26/16     Full medical history updated and reviewed in the office today  Patient Active Problem List   Diagnosis Date Noted  . Obesity, Class I, BMI 30-34.9 02/22/2017    Priority: High  . Drug abuse, marijuana 02/22/2017    Priority: High  . Chronic heartburn 02/22/2017    Priority: High  . Adjustment disorder with mixed anxiety and depressed mood 07/04/2007    Priority: High  . h/o chronic LOW BACK PAIN 07/10/2008    Priority: Medium  . Epigastric abdominal pain 02/22/2017    Priority: Low  . Elevated triglycerides with high cholesterol 03/22/2017  . Low serum HDL 03/22/2017  . Myalgia 03/22/2017  . h/o Seizures ([place]) 04/26/2016  . History of many Head concussions 04/26/2016  . h/o Chronic neck pain 04/26/2016  . History of shingles 04/26/2016  . Neuralgia, postherpetic- R L Ext 04/26/2016  . ?able Drug-seeking behavior 04/26/2016  . Edema of right lower extremity 04/26/2016  . h/o Weakness of left arm 07/14/2013  . h/o Paresthesia of left arm 07/14/2013  . h/o Neck pain on left side 03/17/2013  . h/o Pain in joint, R shoulder region 03/17/2013  . Acute confusional state  12/18/2012  . h/o TOBACCO USER 05/10/2009  . h/o Migraine 02/15/2009    Past Medical History:  Diagnosis Date  . Chronic back pain   . Chronic left shoulder pain   . Chronic neck pain   . Depression   . Migraines   . Paresthesia of left arm and leg    and weakness of left arm and leg  . Seizures ([place])     Past Surgical History:  Procedure Laterality Date  . BACK SURGERY    . KNEE SURGERY      Social History  Substance Use Topics  . Smoking status: Former Smoker    Types: Cigarettes    Quit date: 09/04/2013  . Smokeless tobacco: Never Used  . Alcohol use No    Family Hx: Family History  Problem Relation Age of  Onset  . Diabetes Father   . Diabetes Maternal Grandmother   . Heart disease Maternal Grandmother        CHF  . Heart attack Maternal Grandmother      Medications: Current Outpatient Prescriptions  Medication Sig Dispense Refill  . cyclobenzaprine (FLEXERIL) 10 MG tablet Take 1 tablet (10 mg total) by mouth 3 (three) times daily as needed for muscle spasms. 30 tablet 0  . HYDROcodone-acetaminophen (NORCO/VICODIN) 5-325 MG tablet Take 1 tablet by mouth every 6 (six) hours as needed for moderate pain.    Marland Kitchen omeprazole (PRILOSEC) 20 MG capsule Take 20 mg by mouth daily.    . ondansetron (ZOFRAN-ODT) 4 MG disintegrating tablet Take 4 mg by mouth every 8 (eight) hours as needed for nausea or vomiting.     No current facility-administered medications for this visit.     Allergies:  No Known Allergies   Review of Systems: General:   No F/C, wt loss Pulm:   No DIB, SOB, pleuritic chest pain Card:  No CP, palpitations Abd:  No n/v/d or pain Ext:  No inc edema from baseline  Objective:  Blood pressure 123/85, pulse 82, height 5' 9.75" (1.772 m), weight 222 lb 12 oz (101 kg). Body mass index is 32.19 kg/m. Gen:   Well NAD, A and O *3 HEENT:    [place]/AT, EOMI,  MMM Lungs:   Normal work of breathing. CTA B/L, no Wh, rhonchi Heart:   RRR, S1, S2 WNL's, no  MRG Abd:   No gross distention Exts:    warm, pink,  Brisk capillary refill, warm and well perfused.  Psych:    No HI/SI, judgement and insight good, Euthymic mood. Full Affect.   Recent Results (from the past 2160 hour(s))  CBC and differential     Status: None   Collection Time: 02/14/17 12:00 AM  Result Value Ref Range   Hemoglobin 15.4 13.5 - 17.5   HCT 46 41 - 53   Platelets 253 150 - 399   WBC 8.2   Basic metabolic panel     Status: None   Collection Time: 02/14/17 12:00 AM  Result Value Ref Range   Glucose 114    BUN 18 4 - 21   Creatinine 1.0 0.6 - 1.3   Potassium 4.2 3.4 - 5.3   Sodium 138 137 - 147  Hepatic function panel     Status: Abnormal   Collection Time: 02/14/17 12:00 AM  Result Value Ref Range   Alkaline Phosphatase 53 25 - 125   ALT 45 (A) 10 - 40   AST 40 14 - 40   Bilirubin, Total 0.7   CBC and differential     Status: None   Collection Time: 02/15/17 12:00 AM  Result Value Ref Range   HCT 45 41 - 53   Platelets 243 150 - 399   WBC 6.9   Basic metabolic panel     Status: None   Collection Time: 02/15/17 12:00 AM  Result Value Ref Range   Glucose 98    BUN 17 4 - 21   Creatinine 1.1 0.6 - 1.3   Potassium 4.1 3.4 - 5.3   Sodium 137 137 - 147  Hepatic function panel     Status: Abnormal   Collection Time: 02/15/17 12:00 AM  Result Value Ref Range   Alkaline Phosphatase 48 25 - 125   ALT 47 (A) 10 - 40   AST 36 14 - 40   Bilirubin,  Total 0.9   Lipid panel     Status: Abnormal   Collection Time: 02/23/17  8:42 AM  Result Value Ref Range   Cholesterol, Total 177 100 - 199 mg/dL   Triglycerides 175 (H) 0 - 149 mg/dL   HDL 37 (L) >39 mg/dL   VLDL Cholesterol Cal 35 5 - 40 mg/dL   LDL Calculated 105 (H) 0 - 99 mg/dL   Chol/HDL Ratio 4.8 0.0 - 5.0 ratio    Comment:                                   T. Chol/HDL Ratio                                             Men  Women                               1/2 Avg.Risk  3.4    3.3                                    Avg.Risk  5.0    4.4                                2X Avg.Risk  9.6    7.1                                3X Avg.Risk 23.4   11.0   Hemoglobin A1c     Status: None   Collection Time: 02/23/17  8:42 AM  Result Value Ref Range   Hgb A1c MFr Bld 5.4 4.8 - 5.6 %    Comment:          Pre-diabetes: 5.7 - 6.4          Diabetes: >6.4          Glycemic control for adults with diabetes: <7.0    Est. average glucose Bld gHb Est-mCnc 108 mg/dL

## 2017-04-09 ENCOUNTER — Ambulatory Visit (INDEPENDENT_AMBULATORY_CARE_PROVIDER_SITE_OTHER): Payer: Self-pay | Admitting: Family Medicine

## 2017-04-09 ENCOUNTER — Encounter: Payer: Self-pay | Admitting: Family Medicine

## 2017-04-09 ENCOUNTER — Encounter: Payer: Self-pay | Admitting: Gastroenterology

## 2017-04-09 VITALS — BP 138/86 | HR 86 | Temp 98.3°F | Ht 69.75 in | Wt 218.1 lb

## 2017-04-09 DIAGNOSIS — E66811 Obesity, class 1: Secondary | ICD-10-CM

## 2017-04-09 DIAGNOSIS — F4323 Adjustment disorder with mixed anxiety and depressed mood: Secondary | ICD-10-CM

## 2017-04-09 DIAGNOSIS — R110 Nausea: Secondary | ICD-10-CM

## 2017-04-09 DIAGNOSIS — F1210 Cannabis abuse, uncomplicated: Secondary | ICD-10-CM

## 2017-04-09 DIAGNOSIS — F17200 Nicotine dependence, unspecified, uncomplicated: Secondary | ICD-10-CM

## 2017-04-09 DIAGNOSIS — R1013 Epigastric pain: Secondary | ICD-10-CM

## 2017-04-09 DIAGNOSIS — R112 Nausea with vomiting, unspecified: Secondary | ICD-10-CM

## 2017-04-09 DIAGNOSIS — E669 Obesity, unspecified: Secondary | ICD-10-CM

## 2017-04-09 HISTORY — DX: Nausea with vomiting, unspecified: R11.2

## 2017-04-09 MED ORDER — ALUM & MAG HYDROXIDE-SIMETH 400-400-40 MG/5ML PO SUSP: 10.0000 mL | Freq: Four times a day (QID) | ORAL | 0 refills | Status: DC | PRN

## 2017-04-09 MED ORDER — RANITIDINE HCL 300 MG PO TABS: 300.0000 mg | ORAL_TABLET | Freq: Two times a day (BID) | ORAL | 0 refills | Status: DC

## 2017-04-09 MED ORDER — OMEPRAZOLE 40 MG PO CPDR: 40.0000 mg | DELAYED_RELEASE_CAPSULE | Freq: Two times a day (BID) | ORAL | 0 refills | Status: DC

## 2017-04-09 MED ORDER — ONDANSETRON 8 MG PO TBDP: 8.0000 mg | ORAL_TABLET | Freq: Three times a day (TID) | ORAL | 0 refills | Status: DC | PRN

## 2017-04-09 NOTE — Patient Instructions (Addendum)
Told pt to double up on his H2-bl and PPI for just 1-2 wks, then go to once daily- and lowest dose - once sx under good control.    If your pain doesn't improve over the next 48 hours, then I want you to come in and get acute labs to include LFTs, pancreatic enzymes, cbc etc etc.  --> Please give him Monday and Tuesday excuse his from work.  Should be able to return Wednesday.  If not he will return to clinic for lab work.    - avoid Tylenol, Advil or Aleve.    Must avoid all acidic foods and follow reflux prudent diet that we discussed in the past.    Told to stop smoking.   Nothing eat drink less than 3 hrs within lying down at night.     Patient understands that since he's been on a PPI daily /chronically that our tests, urea breath test or stool for H. pylori will not be effective/ accurate so we will forego this.     Peptic Ulcer A peptic ulcer is a sore in the lining of the esophagus (esophageal ulcer), the stomach (gastric ulcer), or the first part of the small intestine (duodenal ulcer). The ulcer causes gradual wearing away (erosion) into the deeper tissue. What are the causes? Normally, the lining of the stomach and the small intestine protects itself from the acid that digests food. The protective lining can be damaged by:  An infection caused by a germ (bacterium) called Helicobacter pylori or H. pylori.  Regular use of NSAIDs, such as ibuprofen or aspirin.  Rare tumors in the stomach, small intestine, or pancreas (Zollinger-Ellison syndrome).  What increases the risk? The following factors may make you more likely to develop this condition:  Smoking.  Having a family history of ulcer disease.  What are the signs or symptoms? Symptoms of this condition include:  Burning pain or gnawing in the area between the chest and the belly button. The pain may be worse on an empty stomach and at night.  Heartburn.  Nausea and vomiting.  Bloating.  If the ulcer  results in bleeding, it can cause:  Black, tarry stools.  Vomiting of bright red blood.  Vomiting of material that looks like coffee grounds.  How is this diagnosed? This condition may be diagnosed based on:  Medical history and physical exam.  Various tests or procedures, such as: ? Blood tests, stool tests, or breath tests to check for the H. pylori bacterium. ? An X-ray exam (upper gastrointestinal series) of the esophagus, stomach, and small intestine. ? Upper endoscopy. The health care provider examines the esophagus, stomach, and small intestine using a small flexible tube that has a video camera at the end. ? Biopsy. A tissue sample is removed to be examined under a microscope.  How is this treated? Treatment for this condition may include:  Eliminating the cause of the ulcer, such as smoking or the use of NSAIDs or alcohol.  Medicines to reduce the amount of acid in your digestive tract.  Antibiotic medicines, if the ulcer is caused by the H. pylori bacterium.  An upper endoscopy to treat a bleeding ulcer.  Surgery, if the bleeding is severe or if the ulcer created a hole somewhere in the digestive system.  Follow these instructions at home:  Avoid alcohol and caffeine.  Do not use any tobacco products, such as cigarettes, chewing tobacco, and e-cigarettes. If you need help quitting, ask your health care provider.  Take over-the-counter and prescription medicines only as told by your health care provider. Do not use over-the-counter medicines in place of prescription medicines unless your health care provider approves.  Keep all follow-up visits as told by your health care provider. This is important. Contact a health care provider if:  Your symptoms do not improve within 7 days of starting treatment.  You have ongoing indigestion or heartburn. Get help right away if:  You have sudden, sharp, or persistent pain in your abdomen.  You have bloody or dark black,  tarry stools.  You vomit blood or material that looks like coffee grounds.  You become light-headed or you feel faint.  You become weak.  You become sweaty or clammy. This information is not intended to replace advice given to you by your health care provider. Make sure you discuss any questions you have with your health care provider. Document Released: 08/18/2000 Document Revised: 01/24/2016 Document Reviewed: 05/22/2015 Elsevier Interactive Patient Education  [employer].

## 2017-04-09 NOTE — Progress Notes (Signed)
Pt here for an acute care OV today   Impression and Recommendations:    1. Epigastric abdominal pain   2. Nausea   3. Nausea and vomiting, intractability of vomiting not specified, unspecified vomiting type   4. Drug abuse, smokes marijuana qd   5. Obesity, Class I, BMI 30-34.9   6. h/o TOBACCO USER   7. Adjustment disorder with mixed anxiety and depressed mood     -Told pt to double up on his H2-bl and PPI for just 1-2 wks, then go to once daily- and lowest dose - once sx under better control.  Maalox prn severe pains. Lifestyle mod is essential. Stop smoking!  - GI referral - eval and tx  If your pain doesn't improve over the next 48 hours, then I want you to come in and get acute labs to include LFTs, pancreatic enzymes, cbc etc etc.  --> Please give him Monday and Tuesday excuse his from work.  Should be able to return Wednesday.  If not he will return to clinic for lab work.    - avoid Tylenol, Advil or Aleve.    - Must avoid all acidic foods and follow reflux prudent diet that we discussed in the past.    - Told to stop smoking.   -  Nothing eat drink less than 3 hrs within lying down at night.   - Patient understands that since he's been on a PPI daily /chronically that our OP tests for H. Pylori, urea breath test or stool will not be effective/ accurate so we will forego this, esp since self-pay-  pt not willing to incur cost if won't be accurate.       The patient was counseled, risk factors were discussed, anticipatory guidance given.  New Prescriptions   ALUM & MAG HYDROXIDE-SIMETH (MAALOX ADVANCED MAX ST) 400-400-40 MG/5ML SUSPENSION    Take 10 mLs by mouth every 6 (six) hours as needed for indigestion.   RANITIDINE (ZANTAC) 300 MG TABLET    Take 1 tablet (300 mg total) by mouth 2 (two) times daily.    Discontinued Medications   No medications on file    Modified Medications   Modified Medication Previous Medication   OMEPRAZOLE (PRILOSEC) 40 MG  CAPSULE omeprazole (PRILOSEC) 20 MG capsule      Take 1 capsule (40 mg total) by mouth 2 (two) times daily before a meal.    Take 20 mg by mouth daily.   ONDANSETRON (ZOFRAN-ODT) 8 MG DISINTEGRATING TABLET ondansetron (ZOFRAN-ODT) 4 MG disintegrating tablet      Take 1 tablet (8 mg total) by mouth every 8 (eight) hours as needed for nausea or vomiting.    Take 4 mg by mouth every 8 (eight) hours as needed for nausea or vomiting.    Orders Placed This Encounter  Procedures  . Ambulatory referral to Gastroenterology     Gross side effects, risk and benefits, and alternatives of medications and treatment plan in general discussed with patient.  Patient is aware that all medications have potential side effects and we are unable to predict every side effect or drug-drug interaction that may occur.   Patient will call with any questions prior to using medication if they have concerns.  Expresses verbal understanding and consents to current therapy and treatment regimen.  No barriers to understanding were identified.  Red flag symptoms and signs discussed in detail.  Patient expressed understanding regarding what to do in case of emergency\urgent symptoms  Please see  AVS handed out to patient at the end of our visit for further patient instructions/ counseling done pertaining to today's office visit.   Return if symptoms worsen or fail to improve, for f/up with GI- VERY important.     Note: This document was prepared using Dragon voice recognition software and may include unintentional dictation errors.  Mellody Dance 12:31 PM --------------------------------------------------------------------------------------------------------------------------------------------------------------------------------------------------------------------------------------------    Subjective:    CC:  Chief Complaint  Patient presents with  . Abdominal Pain    HPI: Micheal Roth is a 41 y.o. male  who presents to [facility] today for issues as discussed below.   TODAY:  Increased epigastric pain started yesterday around noon-->  Didn't eat yet that day, But had tacos and [place] food the night before for dinner.  Pain lasted throughout the day- then 1:30 am today--> was severe 10/10 pain in epigastric region-  Like pain he had when went into hosp in [place] recently- which I don't have records of yet.    This time pain that was sharp then dull is just staying sharp.  Knife-like pain in epigastric region- twisting pain.   Taking prilosec 20mg   twice daily and zantac daily mid-day.   Has had no sx/ symptoms were well controlled until last night/ yesterday.    Pt had abd/pelvis CT scan, acute bldwrk in ED for same pain about 3 mo ago- in [place].  See notes below.   No change in sx from prior except sharper than usual and more constant      Notes from office visit when I saw patient on 6\21\18:  ":Long standing abd pain- comes and goes.  And never lasts long but recently- had episode  Almost 1 week ago.   Awoke him from sleep- sharp electrution type pain in upper middle abd--> right under sternum.  Made him even vomit due to pain.  Nausea has gone away by now, but still a constant burning pain since last week- sharp episodes go on 4-5 times per day and last seconds to maybe a minute.    Not related to empty versus full stomach- not W or better with food.  NO W or better laying down versus not.   Was so bad went to ED in [place] stayed overnight--> last Wed- the morning after it started at 4am that morning.  No ABN at hosp--> ct scan abd, bldwrk, UA, MRI or neck and thoracic spine- N.  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   Gave pt viscous lidocaine, morphine, and sent home with hydrocodone, flexeril and anti-N meds.  Pain- No improvement.     Also patient has been taking 2 ibuprofen every morning for general aches and  pains - Neck and back aches to help him get through his work day, and 2-3 days per week he additionally takes doses at night. No more than 4 per day.   A lot of stress in life lately- GF and he have been together 14 yrs and car broke down, hates his job, getting into arguments at work and waiting to be fired.   Quit smoking 2 yrs ago - smoked for 20 yrs at 1.5ppd at most.   30 yr hx.  Smokes marijuana daily --> for 30-40 yrs. Designer, jewellery- walks all day long- says he must walk over 10,000 steps daily and is constantly on his feet.  Lives with GF, son- 35 yo.    Problem  Obesity, Class I, Bmi 30-34.9  Drug abuse, smokes marijuana qd   Quit smoking 2 yrs ago - smoked for 20 yrs at 1.5ppd at most.   30 yr hx.  Smokes marijuana daily --> for 30-40 yrs. "   Chronic Heartburn  Adjustment Disorder With Mixed Anxiety and Depressed Mood   Qualifier: Diagnosis of  By: Darylene Price MD, Mark    A lot of stress in life lately- GF and he have been together 14 yrs and car broke down, hates his job, getting into arguments at work and waiting to be fired.   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     Lives with GF, son- 20 yo.   Elevated Triglycerides With High Cholesterol  Low Serum Hdl  Epigastric Abdominal Pain  Nausea and Vomiting  h/o TOBACCO USER   Qualifier: Diagnosis of  By: Samara Snide   Quit smoking cig 2 yrs ago - smoked for 20 yrs at 1.5ppd at most.   4 yr hx.   Still Smokes marijuana daily --> for 30-40 yrs. "      Wt Readings from Last 3 Encounters:  04/09/17 218 lb 1.6 oz (98.9 kg)  03/22/17 222 lb 12 oz (101 kg)  02/22/17 222 lb 9.6 oz (101 kg)   BP Readings from Last 3 Encounters:  04/09/17 138/86  03/22/17 123/85  02/22/17 130/87   BMI Readings from Last 3 Encounters:  04/09/17 31.52 kg/m  03/22/17 32.19 kg/m  02/22/17 32.17 kg/m     Patient Care Team    Relationship Specialty  Notifications Start End  Mellody Dance, DO PCP - General Family Medicine  04/26/16      Patient Active Problem List   Diagnosis Date Noted  . Obesity, Class I, BMI 30-34.9 02/22/2017    Priority: High  . Drug abuse, smokes marijuana qd 02/22/2017    Priority: High  . Chronic heartburn 02/22/2017    Priority: High  . Adjustment disorder with mixed anxiety and depressed mood 07/04/2007    Priority: High  . Elevated triglycerides with high cholesterol 03/22/2017    Priority: Medium  . Low serum HDL 03/22/2017    Priority: Medium  . h/o chronic LOW BACK PAIN 07/10/2008    Priority: Medium  . Epigastric abdominal pain 02/22/2017    Priority: Low  . Nausea and vomiting 04/09/2017  . Myalgia 03/22/2017  . h/o Seizures ([place]) 04/26/2016  . History of many Head concussions 04/26/2016  . h/o Chronic neck pain 04/26/2016  . History of shingles 04/26/2016  . Neuralgia, postherpetic- R L Ext 04/26/2016  . ?able Drug-seeking behavior 04/26/2016  . Edema of right lower extremity 04/26/2016  . h/o Weakness of left arm 07/14/2013  . h/o Paresthesia of left arm 07/14/2013  . h/o Neck pain on left side 03/17/2013  . h/o Pain in joint, R shoulder region 03/17/2013  . Acute confusional state 12/18/2012  . h/o TOBACCO USER 05/10/2009  . h/o Migraine 02/15/2009    Past Medical history, Surgical history, Family history, Social history, Allergies and Medications have been entered into the medical record, reviewed and changed as needed.    Current Meds  Medication Sig  . cyclobenzaprine (FLEXERIL) 10 MG tablet Take 1 tablet (10 mg total) by mouth 3 (three) times daily as needed for muscle spasms.  Marland Kitchen HYDROcodone-acetaminophen (NORCO/VICODIN) 5-325 MG tablet Take 1 tablet  by mouth every 6 (six) hours as needed for moderate pain.  Marland Kitchen omeprazole (PRILOSEC) 40 MG capsule Take 1 capsule (40 mg total) by mouth 2 (two) times daily before a meal.  . ondansetron (ZOFRAN-ODT) 8 MG disintegrating tablet  Take 1 tablet (8 mg total) by mouth every 8 (eight) hours as needed for nausea or vomiting.  . [DISCONTINUED] omeprazole (PRILOSEC) 20 MG capsule Take 20 mg by mouth daily.  . [DISCONTINUED] ondansetron (ZOFRAN-ODT) 4 MG disintegrating tablet Take 4 mg by mouth every 8 (eight) hours as needed for nausea or vomiting.    Allergies:  No Known Allergies   Review of Systems: General:   Denies fever, chills, unexplained weight loss.  Optho/Auditory:   Denies visual changes, blurred vision/LOV Respiratory:   Denies wheeze, DOE more than baseline levels.  Cardiovascular:   Denies chest pain, palpitations, new onset peripheral edema  Gastrointestinal:   Denies diarrhea, + abd pain, N/V.  Genitourinary: Denies dysuria, freq/ urgency, flank pain or discharge from genitals.  Endocrine:     Denies hot or cold intolerance, polyuria, polydipsia. Musculoskeletal:   Denies unexplained myalgias, joint swelling, unexplained arthralgias, gait problems.  Skin:  Denies new onset rash, suspicious lesions Neurological:     Denies unexplained weakness, numbness  Psychiatric/Behavioral:   Denies mood changes, suicidal or homicidal ideations, hallucinations    Objective:   Blood pressure 138/86, pulse 86, temperature 98.3 F (36.8 C), height 5' 9.75" (1.772 m), weight 218 lb 1.6 oz (98.9 kg). Body mass index is 31.52 kg/m. General:  Well Developed, well nourished, appropriate for stated age.  Neuro:  Alert and oriented,  extra-ocular muscles intact  HEENT:  Normocephalic, atraumatic, neck supple Skin:  no gross rash, warm, pink. Cardiac:  RRR, S1 S2 Respiratory:  ECTA B/L and A/P, Not using accessory muscles, speaking in full sentences- unlabored. Abd:   S, ND, BS * 4Q,  TTdeep Palp esp epigastric region, no R/R, some guarding epigastric Vascular:  Ext warm, no cyanosis apprec.; cap RF less 2 sec. Psych:  No HI/SI, judgement and insight good, Euthymic mood. Full Affect.

## 2017-04-11 ENCOUNTER — Encounter (HOSPITAL_COMMUNITY): Payer: Self-pay | Admitting: *Deleted

## 2017-04-11 ENCOUNTER — Emergency Department (HOSPITAL_COMMUNITY)
Admission: EM | Admit: 2017-04-11 | Discharge: 2017-04-11 | Disposition: A | Discharge status: Home / Self Care | Payer: Self-pay | Attending: Emergency Medicine | Admitting: Emergency Medicine

## 2017-04-11 DIAGNOSIS — Z87891 Personal history of nicotine dependence: Secondary | ICD-10-CM | POA: Insufficient documentation

## 2017-04-11 DIAGNOSIS — K2970 Gastritis, unspecified, without bleeding: Secondary | ICD-10-CM

## 2017-04-11 LAB — COMPREHENSIVE METABOLIC PANEL
ALBUMIN: 4.1 g/dL (ref 3.5–5.0)
ALT: 26 U/L (ref 17–63)
AST: 21 U/L (ref 15–41)
Alkaline Phosphatase: 53 U/L (ref 38–126)
Anion gap: 9 (ref 5–15)
BUN: 12 mg/dL (ref 6–20)
CHLORIDE: 106 mmol/L (ref 101–111)
CO2: 25 mmol/L (ref 22–32)
CREATININE: 1 mg/dL (ref 0.61–1.24)
Calcium: 9.4 mg/dL (ref 8.9–10.3)
GFR calc Af Amer: >60 mL/min (ref >60)
GLUCOSE: 96 mg/dL (ref 65–99)
POTASSIUM: 3.9 mmol/L (ref 3.5–5.1)
Sodium: 140 mmol/L (ref 135–145)
Total Bilirubin: 0.9 mg/dL (ref 0.3–1.2)
Total Protein: 7.7 g/dL (ref 6.5–8.1)

## 2017-04-11 LAB — URINALYSIS, ROUTINE W REFLEX MICROSCOPIC
BILIRUBIN URINE: NEGATIVE
GLUCOSE, UA: NEGATIVE mg/dL
HGB URINE DIPSTICK: NEGATIVE
KETONES UR: NEGATIVE mg/dL
Leukocytes, UA: NEGATIVE
Nitrite: NEGATIVE
PH: 5 (ref 5.0–8.0)
PROTEIN: NEGATIVE mg/dL
Specific Gravity, Urine: 1.025 (ref 1.005–1.030)

## 2017-04-11 LAB — CBC
HEMATOCRIT: 47 % (ref 39.0–52.0)
Hemoglobin: 16.5 g/dL (ref 13.0–17.0)
MCH: 29.7 pg (ref 26.0–34.0)
MCHC: 35.1 g/dL (ref 30.0–36.0)
MCV: 84.5 fL (ref 78.0–100.0)
PLATELETS: 302 10*3/uL (ref 150–400)
RBC: 5.56 MIL/uL (ref 4.22–5.81)
RDW: 12.8 % (ref 11.5–15.5)
WBC: 11.9 10*3/uL — AB (ref 4.0–10.5)

## 2017-04-11 LAB — LIPASE, BLOOD: LIPASE: 35 U/L (ref 11–51)

## 2017-04-11 MED ORDER — DICYCLOMINE HCL 20 MG PO TABS: 20.0000 mg | ORAL_TABLET | Freq: Two times a day (BID) | ORAL | 0 refills | Status: DC

## 2017-04-11 MED ORDER — OXYCODONE-ACETAMINOPHEN 5-325 MG PO TABS
ORAL_TABLET | ORAL | Status: DC
Start: 2017-04-11 — End: 2017-04-12
  Filled 2017-04-11: qty 1

## 2017-04-11 MED ORDER — PANTOPRAZOLE SODIUM 40 MG PO TBEC
40.0000 mg | DELAYED_RELEASE_TABLET | Freq: Once | ORAL | Status: AC
  Administered 2017-04-11: 40 mg via ORAL
  Filled 2017-04-11: qty 1

## 2017-04-11 MED ORDER — SUCRALFATE 1 G PO TABS: 1.0000 g | ORAL_TABLET | Freq: Three times a day (TID) | ORAL | 0 refills | Status: DC

## 2017-04-11 MED ORDER — ONDANSETRON HCL 4 MG/2ML IJ SOLN
4.0000 mg | Freq: Once | INTRAMUSCULAR | Status: AC
  Administered 2017-04-11: 4 mg via INTRAVENOUS
  Filled 2017-04-11: qty 2

## 2017-04-11 MED ORDER — HYDROMORPHONE HCL 1 MG/ML IJ SOLN
1.0000 mg | Freq: Once | INTRAMUSCULAR | Status: AC
  Administered 2017-04-11: 1 mg via INTRAVENOUS
  Filled 2017-04-11: qty 1

## 2017-04-11 MED ORDER — MORPHINE SULFATE (PF) 4 MG/ML IV SOLN
4.0000 mg | Freq: Once | INTRAVENOUS | Status: AC
  Administered 2017-04-11: 4 mg via INTRAVENOUS
  Filled 2017-04-11: qty 1

## 2017-04-11 MED ORDER — GI COCKTAIL ~~LOC~~
30.0000 mL | Freq: Once | ORAL | Status: AC
  Administered 2017-04-11: 30 mL via ORAL
  Filled 2017-04-11: qty 30

## 2017-04-11 MED ORDER — SODIUM CHLORIDE 0.9 % IV BOLUS (SEPSIS)
1000.0000 mL | Freq: Once | INTRAVENOUS | Status: AC
  Administered 2017-04-11: 1000 mL via INTRAVENOUS

## 2017-04-11 NOTE — ED Provider Notes (Signed)
[facility] DEPT Provider Note   CSN: [id number] Arrival date & time: 04/11/17  1040  By signing my name below, I, Micheal Roth, attest that this documentation has been prepared under the direction and in the presence of [name], PA-C.  Electronically Signed: [name], ED Scribe. 04/11/17. 6:29 PM.  History   Chief Complaint Chief Complaint  Patient presents with  . Abdominal Pain  . Emesis   The history is provided by the patient. No language interpreter was used.    41 year old male with a history of chronic heartburn, chronic low back pain, obesity, and questionable drug-seeking behavior, who presents to the emergency department with ongoing epigastric abdominal pain beginning 2 days ago. Patient has a long-standing history of epigastric abdominal pain for which she is followed by his PCP with. He states that they currently believe his pain is due to a gastric ulcer. States his current episode began 2 days ago with some initial nausea and vomiting. Has not had vomiting since. He describes his pain as constant and sharp with intermittent, more severe shocking sensations. He is currently on a regimen of Prilosec and ranitidine daily for his abdominal pain, this has not been improving his current flareup of symptoms. Was last admitted for this on 02/14/17, was given morphine and hydrocodone, which he states improved his pain at that time. He denies chest pain, hematochezia, melena, diarrhea, constipation, dysuria, hematuria, fever, or any other associated symptoms.  Past Medical History:  Diagnosis Date  . Chronic back pain   . Chronic left shoulder pain   . Chronic neck pain   . Depression   . Migraines   . Paresthesia of left arm and leg    and weakness of left arm and leg  . Seizures [facility])    Patient Active Problem List   Diagnosis Date Noted  . Nausea and vomiting 04/09/2017  . Elevated triglycerides with high cholesterol 03/22/2017  . Low serum HDL  03/22/2017  . Myalgia 03/22/2017  . Obesity, Class I, BMI 30-34.9 02/22/2017  . Drug abuse, smokes marijuana qd 02/22/2017  . Chronic heartburn 02/22/2017  . Epigastric abdominal pain 02/22/2017  . h/o Seizures ([place]) 04/26/2016  . History of many Head concussions 04/26/2016  . h/o Chronic neck pain 04/26/2016  . History of shingles 04/26/2016  . Neuralgia, postherpetic- R L Ext 04/26/2016  . ?able Drug-seeking behavior 04/26/2016  . Edema of right lower extremity 04/26/2016  . h/o Weakness of left arm 07/14/2013  . h/o Paresthesia of left arm 07/14/2013  . h/o Neck pain on left side 03/17/2013  . h/o Pain in joint, R shoulder region 03/17/2013  . Acute confusional state 12/18/2012  . h/o TOBACCO USER 05/10/2009  . h/o Migraine 02/15/2009  . h/o chronic LOW BACK PAIN 07/10/2008  . Adjustment disorder with mixed anxiety and depressed mood 07/04/2007   Past Surgical History:  Procedure Laterality Date  . BACK SURGERY    . KNEE SURGERY      Home Medications    Prior to Admission medications   Medication Sig Start Date End Date Taking? Authorizing Provider  alum & mag hydroxide-simeth (MAALOX ADVANCED MAX ST) 400-400-40 MG/5ML suspension Take 10 mLs by mouth every 6 (six) hours as needed for indigestion. 04/09/17   Opalski, [name], DO  cyclobenzaprine (FLEXERIL) 10 MG tablet Take 1 tablet (10 mg total) by mouth 3 (three) times daily as needed for muscle spasms. 03/22/17   Mellody Dance, DO  HYDROcodone-acetaminophen (NORCO/VICODIN) 5-325 MG tablet Take  1 tablet by mouth every 6 (six) hours as needed for moderate pain.    [provider]  omeprazole (PRILOSEC) 40 MG capsule Take 1 capsule (40 mg total) by mouth 2 (two) times daily before a meal. 04/09/17   Opalski, Deborah, DO  ondansetron (ZOFRAN-ODT) 8 MG disintegrating tablet Take 1 tablet (8 mg total) by mouth every 8 (eight) hours as needed for nausea or vomiting. 04/09/17   Opalski, [name], DO  ranitidine (ZANTAC) 300 MG  tablet Take 1 tablet (300 mg total) by mouth 2 (two) times daily. 04/09/17   Mellody Dance, DO   Family History Family History  Problem Relation Age of Onset  . Diabetes Father   . Diabetes Maternal Grandmother   . Heart disease Maternal Grandmother        CHF  . Heart attack Maternal Grandmother    Social History Social History  Substance Use Topics  . Smoking status: Former Smoker    Types: Cigarettes    Quit date: 09/04/2013  . Smokeless tobacco: Never Used  . Alcohol use No   Allergies   Patient has no known allergies.  Review of Systems Review of Systems A complete review of systems was obtained and all systems are negative except as noted in the HPI and PMH.   Physical Exam Updated Vital Signs BP 134/88 (BP Location: Right Arm)   Pulse 77   Temp 98.8 F (37.1 C) (Oral)   Resp 16   SpO2 95%   Physical Exam  Constitutional: He appears well-developed and well-nourished. No distress.  HENT:  Head: Normocephalic and atraumatic.  Eyes: Conjunctivae are normal.  Neck: Normal range of motion.  Cardiovascular: Normal rate, regular rhythm and normal heart sounds.   No murmur heard. Pulmonary/Chest: Effort normal. No respiratory distress. He has wheezes. He has no rales.  Mild expiratory wheezing bilaterally.  Abdominal: Soft. Bowel sounds are normal. He exhibits no distension. There is tenderness. There is no rebound and no guarding.  Tenderness in the epigastrium.  Musculoskeletal: Normal range of motion.  Neurological: He is alert.  Skin: No pallor.  Psychiatric: He has a normal mood and affect. His behavior is normal.  Nursing note and vitals reviewed.  ED Treatments / Results  DIAGNOSTIC STUDIES: Oxygen Saturation is 98% on RA, normal by my interpretation.   COORDINATION OF CARE: 6:29 PM-Discussed next steps with pt. Pt verbalized understanding and is agreeable with the plan.   Labs (all labs ordered are listed, but only abnormal results are  displayed) Labs Reviewed  CBC - Abnormal; Notable for the following:       Result Value   WBC 11.9 (*)    All other components within normal limits  LIPASE, BLOOD  COMPREHENSIVE METABOLIC PANEL  URINALYSIS, ROUTINE W REFLEX MICROSCOPIC   EKG  EKG Interpretation None      Radiology No results found.  Procedures Procedures   Medications Ordered in ED Medications  oxyCODONE-acetaminophen (PERCOCET/ROXICET) 5-325 MG per tablet (not administered)   Initial Impression / Assessment and Plan / ED Course  I have reviewed the triage vital signs and the nursing notes.  Pertinent labs & imaging results that were available during my care of the patient were reviewed by me and considered in my medical decision making (see chart for details).     Patient pain improved. He is not vomiting. Patient will be discharged to f/u outpatient with pcp. Labs reviewed.  Final Clinical Impressions(s) / ED Diagnoses   Final diagnoses:  Gastritis without  bleeding, unspecified chronicity, unspecified gastritis type   New Prescriptions New Prescriptions   No medications on file   I personally performed the services described in this documentation, which was scribed in my presence. The recorded information has been reviewed and is accurate.       [name], PA-C 04/13/17 2257    Orlie Dakin, MD 04/14/17 [phone]

## 2017-04-11 NOTE — Discharge Instructions (Signed)
Get help right away if: You vomit blood or material that looks like coffee grounds. You have black or dark red stools. You are unable to keep fluids down. Your abdominal pain gets worse. You have a fever. You do not feel better after 1 week.

## 2017-04-11 NOTE — ED Triage Notes (Signed)
Pt reports sharp stabbing upper abd pains for extended amount of time, has become severe since Sunday. Reports n/v. Denies diarrhea.

## 2017-04-18 ENCOUNTER — Telehealth: Payer: Self-pay | Admitting: Family Medicine

## 2017-04-18 NOTE — Telephone Encounter (Signed)
Needs to drink A MINIMUM of 1/2 of his wt in ounces of water per day or more.    Needs to make sure he is eating a healthy diet ( low in saturated and Transfats, lower carbs and eating fiber and protein with every meal)   Eat at least 3 meals per day   advise him again to stop any external supplements, herbals or drugs such as marijuana, alcohol etc.   There is no additional labs, tests etc to obtain at this time from a family physician standpoint, hence is why we sent him to GI. Needs to f/up with them regarding these symptoms.    Please remind him that until he starts living a healthier lifestyle, it is going to be difficult for him to "feel well".

## 2017-04-18 NOTE — Telephone Encounter (Signed)
Pt called states while at work whenever his body gets hot/over heated he gets very nauseated & starts vomiting--Pls cal him to advise if he should come in to be check out. or what to do?-- Patient can be reached at [phone]. --glh

## 2017-04-18 NOTE — Telephone Encounter (Signed)
Please advise. Thank you MPulliam, CMA/RT(R)  

## 2017-04-19 NOTE — Telephone Encounter (Signed)
Pt advised.  Pt states that he has been following a better diet with more vegetables and fruit.  Advised pt to avoid acidic fruits such as oranges and apples.  He has been drinking at least half of his body weight in ounces per day of water.  He states that he did have some vomiting this morning, which has subsided now.  Pt does not have an appt to see GI until 05/24/17.  Pt states he will call their office and request to move his appointment up.  Advised pt to call if he is unsuccessful and we will attempt to help with this matter.  Pt expressed understanding and is agreeable.  Charyl Bigger, CMA

## 2017-04-25 ENCOUNTER — Encounter (INDEPENDENT_AMBULATORY_CARE_PROVIDER_SITE_OTHER): Payer: Self-pay

## 2017-04-25 ENCOUNTER — Ambulatory Visit (INDEPENDENT_AMBULATORY_CARE_PROVIDER_SITE_OTHER): Payer: Self-pay | Admitting: Gastroenterology

## 2017-04-25 ENCOUNTER — Encounter: Payer: Self-pay | Admitting: Gastroenterology

## 2017-04-25 VITALS — BP 124/84 | HR 70 | Ht 69.75 in | Wt 214.4 lb

## 2017-04-25 DIAGNOSIS — R1013 Epigastric pain: Secondary | ICD-10-CM

## 2017-04-25 DIAGNOSIS — R112 Nausea with vomiting, unspecified: Secondary | ICD-10-CM

## 2017-04-25 MED ORDER — RANITIDINE HCL 300 MG PO TABS: 300.0000 mg | ORAL_TABLET | Freq: Every day | ORAL | 0 refills | Status: DC

## 2017-04-25 MED ORDER — OMEPRAZOLE 40 MG PO CPDR: DELAYED_RELEASE_CAPSULE | ORAL | 0 refills | Status: DC

## 2017-04-25 NOTE — Progress Notes (Signed)
History of Present Illness: This is a 41 year old male referred by Mellody Dance, DO for the evaluation of epigastric pain and intermittent N/V. He complains of a sharp, shock like in his epigastric area occurring frequently for the past month. The symptoms start suddenly and then persist for hours. They're not associated with meals or bowel movements. They're timing appears to be random. Symptoms have been occurring for several weeks. He also has noted occasional morning nausea, vomiting and has had nausea, vomiting with significant exertion outside in hot weather. He was evaluated in the ED on April 11, 2017 and I reviewed the ED notes. Abdominal/pelvic CT scan performed on 02/14/2017 showed hepatic steatosis and no other GI abnormalities. CBC, CMP and lipase on August 8 were unremarkable except for mildly elevated WBC at 11.9. He was started on omeprazole 40 mg twice daily and ranitidine 300 mg once daily with no change whatsoever in symptoms. Zofran has been helpful for nausea. MRI of the C-spine showed cervical degenerative spondylosis and spinal stenosis. MRI of the thoracic spine showed mild bulging disks at T9-10 and T10-11 and mild degenerative spondylosis at multiple levels. Carafate has not provided any symptomatic relief. Denies weight loss, constipation, diarrhea, change in stool caliber, melena, hematochezia, dysphagia, chest pain.   No Known Allergies Outpatient Medications Prior to Visit  Medication Sig Dispense Refill  . alum & mag hydroxide-simeth (MAALOX ADVANCED MAX ST) 400-400-40 MG/5ML suspension Take 10 mLs by mouth every 6 (six) hours as needed for indigestion. 355 mL 0  . cyclobenzaprine (FLEXERIL) 10 MG tablet Take 1 tablet (10 mg total) by mouth 3 (three) times daily as needed for muscle spasms. 30 tablet 0  . omeprazole (PRILOSEC) 40 MG capsule Take 1 capsule (40 mg total) by mouth 2 (two) times daily before a meal. 60 capsule 0  . ondansetron (ZOFRAN-ODT) 8 MG  disintegrating tablet Take 1 tablet (8 mg total) by mouth every 8 (eight) hours as needed for nausea or vomiting. 30 tablet 0  . ranitidine (ZANTAC) 300 MG tablet Take 1 tablet (300 mg total) by mouth 2 (two) times daily. 60 tablet 0  . dicyclomine (BENTYL) 20 MG tablet Take 1 tablet (20 mg total) by mouth 2 (two) times daily. (Patient not taking: Reported on 04/25/2017) 20 tablet 0  . HYDROcodone-acetaminophen (NORCO/VICODIN) 5-325 MG tablet Take 1 tablet by mouth every 6 (six) hours as needed for moderate pain.    Marland Kitchen sucralfate (CARAFATE) 1 g tablet Take 1 tablet (1 g total) by mouth 4 (four) times daily -  with meals and at bedtime. (Patient not taking: Reported on 04/25/2017) 120 tablet 0   No facility-administered medications prior to visit.    Past Medical History:  Diagnosis Date  . Chronic back pain   . Chronic left shoulder pain   . Chronic neck pain   . Depression   . Migraines   . Paresthesia of left arm and leg    and weakness of left arm and leg  . Seizures ([place])    Past Surgical History:  Procedure Laterality Date  . BACK SURGERY    . KNEE SURGERY     Social History   Social History  . Marital status: Single    Spouse name: N/A  . Number of children: N/A  . Years of education: N/A   Social History Main Topics  . Smoking status: Former Smoker    Types: Cigarettes    Quit date: 09/04/2013  . Smokeless tobacco: Never Used  .  Alcohol use No  . Drug use: Yes    Types: Marijuana  . Sexual activity: Yes    Birth control/ protection: Condom   Other Topics Concern  . None   Social History Narrative  . None   Family History  Problem Relation Age of Onset  . Diabetes Father   . Diabetes Maternal Grandmother   . Heart disease Maternal Grandmother        CHF  . Heart attack Maternal Grandmother   . Crohn's disease Brother        Review of Systems: Pertinent positive and negative review of systems were noted in the above HPI section. All other review of systems  were otherwise negative.   Physical Exam: General: Well developed, well nourished, no acute distress Head: Normocephalic and atraumatic Eyes:  sclerae anicteric, EOMI Ears: Normal auditory acuity Mouth: No deformity or lesions Neck: Supple, no masses or thyromegaly Lungs: Clear throughout to auscultation Heart: Regular rate and rhythm; no murmurs, rubs or bruits Abdomen: Soft, non tender and non distended. No masses, hepatosplenomegaly or hernias noted. Normal Bowel sounds Musculoskeletal: Symmetrical with no gross deformities  Skin: No lesions on visible extremities Pulses:  Normal pulses noted Extremities: No clubbing, cyanosis, edema or deformities noted Neurological: Alert oriented x 4, grossly nonfocal Cervical Nodes:  No significant cervical adenopathy Inguinal Nodes: No significant inguinal adenopathy Psychological:  Alert and cooperative. Normal mood and affect  Assessment and Recommendations:  1. Epigastric pain which is c/w musculoskeletal, neuropathic or radicular pain. Cervical and thoracic spinal abnormalities as outlined in HPI. Gastrointestinal etiologies are very unlikely as he has not responded to any GI medications, his symptoms do not correlate at all with meals or bowel movements and his abdominal/pelvic CT scan was unremarkable. Schedule EGD for further evaluation. Continue omeprazole 40 mg twice daily taken 30 minutes before breakfast and dinner. Continue ranitidine 300 mg at bedtime. Continue Carafate when necessary and Zofran when necessary as prescribed. The risks (including bleeding, perforation, infection, missed lesions, medication reactions and possible hospitalization or surgery if complications occur), benefits, and alternatives to endoscopy with possible biopsy and possible dilation were discussed with the patient and they consent to proceed.   2. Episodic nausea and vomiting, frequently mornings. Etiology unclear. See #1.  3. Hepatic steatosis. Long-term  low fat, carb modified diet adjusted to maintain normal weight.  cc: Mellody Dance, DO [address] [place], [place] [address]

## 2017-04-25 NOTE — Patient Instructions (Addendum)
You have been scheduled for an endoscopy. Please follow written instructions given to you at your visit today. If you use inhalers (even only as needed), please bring them with you on the day of your procedure. Your physician has requested that you go to www.startemmi.com and enter the access code given to you at your visit today. This web site gives a general overview about your procedure. However, you should still follow specific instructions given to you by our office regarding your preparation for the procedure.   Please begin taking your Prilosec before breakfast and dinner.  Please take Zantac at bedtime.  If you are age 56 or older, your body mass index should be between 23-30. Your Body mass index is 30.98 kg/m. If this is out of the aforementioned range listed, please consider follow up with your Primary Care Provider.  If you are age 21 or younger, your body mass index should be between 19-25. Your Body mass index is 30.98 kg/m. If this is out of the aformentioned range listed, please consider follow up with your Primary Care Provider.    Thank you for choosing me and [facility].  Pricilla Riffle. Dagoberto Ligas., MD., Marval Regal

## 2017-04-26 ENCOUNTER — Encounter: Payer: Self-pay | Admitting: Gastroenterology

## 2017-04-26 ENCOUNTER — Ambulatory Visit (AMBULATORY_SURGERY_CENTER): Payer: Self-pay | Admitting: Gastroenterology

## 2017-04-26 VITALS — BP 126/69 | HR 62 | Temp 98.0°F | Resp 12 | Ht 69.0 in | Wt 214.0 lb

## 2017-04-26 DIAGNOSIS — R1013 Epigastric pain: Secondary | ICD-10-CM

## 2017-04-26 DIAGNOSIS — R112 Nausea with vomiting, unspecified: Secondary | ICD-10-CM

## 2017-04-26 MED ORDER — SODIUM CHLORIDE 0.9 % IV SOLN: 500.0000 mL | INTRAVENOUS | Status: DC

## 2017-04-26 NOTE — Patient Instructions (Signed)
YOU HAD AN ENDOSCOPIC PROCEDURE TODAY AT [facility]:   Refer to the procedure report that was given to you for any specific questions about what was found during the examination.  If the procedure report does not answer your questions, please call your gastroenterologist to clarify.  If you requested that your care partner not be given the details of your procedure findings, then the procedure report has been included in a sealed envelope for you to review at your convenience later.  YOU SHOULD EXPECT: Some feelings of bloating in the abdomen. Passage of more gas than usual.  Walking can help get rid of the air that was put into your GI tract during the procedure and reduce the bloating. If you had a lower endoscopy (such as a colonoscopy or flexible sigmoidoscopy) you may notice spotting of blood in your stool or on the toilet paper. If you underwent a bowel prep for your procedure, you may not have a normal bowel movement for a few days.  Please Note:  You might notice some irritation and congestion in your nose or some drainage.  This is from the oxygen used during your procedure.  There is no need for concern and it should clear up in a day or so.  SYMPTOMS TO REPORT IMMEDIATELY:    Following upper endoscopy (EGD)  Vomiting of blood or coffee ground material  New chest pain or pain under the shoulder blades  Painful or persistently difficult swallowing  New shortness of breath  Fever of 100F or higher  Black, tarry-looking stools  For urgent or emergent issues, a gastroenterologist can be reached at any hour by calling [phone].   DIET:  We do recommend a small meal at first, but then you may proceed to your regular diet.  Drink plenty of fluids but you should avoid alcoholic beverages for 24 hours.  ACTIVITY:  You should plan to take it easy for the rest of today and you should NOT DRIVE or use heavy machinery until tomorrow (because of the sedation medicines used  during the test).    FOLLOW UP: Our staff will call the number listed on your records the next business day following your procedure to check on you and address any questions or concerns that you may have regarding the information given to you following your procedure. If we do not reach you, we will leave a message.  However, if you are feeling well and you are not experiencing any problems, there is no need to return our call.  We will assume that you have returned to your regular daily activities without incident.  If any biopsies were taken you will be contacted by phone or by letter within the next 1-3 weeks.  Please call us at [phone] if you have not heard about the biopsies in 3 weeks.    SIGNATURES/CONFIDENTIALITY: You and/or your care partner have signed paperwork which will be entered into your electronic medical record.  These signatures attest to the fact that that the information above on your After Visit Summary has been reviewed and is understood.  Full responsibility of the confidentiality of this discharge information lies with you and/or your care-partner.  Thank you for letting us take care of you healthcare needs today.

## 2017-04-26 NOTE — Op Note (Signed)
[facility] Patient Name: Micheal Roth Procedure Date: 04/26/2017 3:07 PM MRN: [id number] Endoscopist: Ladene Artist , MD Age: 41 Referring MD:  Date of Birth: 1976-05-12 Gender: Male Account #: [id number] Procedure:                Upper GI endoscopy Indications:              Epigastric abdominal pain, Nausea with vomiting Medicines:                Monitored Anesthesia Care Procedure:                Pre-Anesthesia Assessment:                           - Prior to the procedure, a History and Physical                            was performed, and patient medications and                            allergies were reviewed. The patient's tolerance of                            previous anesthesia was also reviewed. The risks                            and benefits of the procedure and the sedation                            options and risks were discussed with the patient.                            All questions were answered, and informed consent                            was obtained. Prior Anticoagulants: The patient has                            taken no previous anticoagulant or antiplatelet                            agents. ASA Grade Assessment: II - A patient with                            mild systemic disease. After reviewing the risks                            and benefits, the patient was deemed in                            satisfactory condition to undergo the procedure.                           After obtaining informed consent, the endoscope was  passed under direct vision. Throughout the                            procedure, the patient's blood pressure, pulse, and                            oxygen saturations were monitored continuously. The                            Model GIF-HQ190 [id number]) scope was introduced                            through the mouth, and advanced to the second part                            of  duodenum. The upper GI endoscopy was                            accomplished without difficulty. The patient                            tolerated the procedure well. Scope In: Scope Out: Findings:                 The examined esophagus was normal.                           The entire examined stomach was normal.                           The duodenal bulb and second portion of the                            duodenum were normal. Complications:            No immediate complications. Estimated Blood Loss:     Estimated blood loss: none. Impression:               - Normal esophagus.                           - Normal stomach.                           - Normal duodenal bulb and second portion of the                            duodenum.                           - No specimens collected. Recommendation:           - Patient has a contact number available for                            emergencies. The signs and symptoms of potential  delayed complications were discussed with the                            patient. Return to normal activities tomorrow.                            Written discharge instructions were provided to the                            patient.                           - Resume previous diet.                           - Continue present medications.                           - Return to primary care physician as previously                            scheduled. Ladene Artist, MD 04/26/2017 3:24:14 PM This report has been signed electronically.

## 2017-04-26 NOTE — Progress Notes (Signed)
Report to PACU, RN, vss, BBS= Clear.  

## 2017-04-27 ENCOUNTER — Telehealth: Payer: Self-pay | Admitting: *Deleted

## 2017-04-27 NOTE — Telephone Encounter (Signed)
  Follow up Call-  Call back number 04/26/2017  Post procedure Call Back phone  # [phone]  Permission to leave phone message Yes  Some recent data might be hidden     Patient questions:  Do you have a fever, pain , or abdominal swelling? No. Pain Score  0 *  Have you tolerated food without any problems? Yes.    Have you been able to return to your normal activities? Yes.    Do you have any questions about your discharge instructions: Diet   No. Medications  No. Follow up visit  No.  Do you have questions or concerns about your Care? No.  Actions: * If pain score is 4 or above: No action needed, pain <4.

## 2017-05-03 ENCOUNTER — Ambulatory Visit (INDEPENDENT_AMBULATORY_CARE_PROVIDER_SITE_OTHER): Payer: Self-pay | Admitting: Family Medicine

## 2017-05-03 ENCOUNTER — Encounter: Payer: Self-pay | Admitting: Family Medicine

## 2017-05-03 VITALS — BP 128/76 | HR 68 | Ht 69.75 in | Wt 213.8 lb

## 2017-05-03 DIAGNOSIS — F1911 Other psychoactive substance abuse, in remission: Secondary | ICD-10-CM | POA: Insufficient documentation

## 2017-05-03 DIAGNOSIS — Z87898 Personal history of other specified conditions: Secondary | ICD-10-CM

## 2017-05-03 DIAGNOSIS — E669 Obesity, unspecified: Secondary | ICD-10-CM

## 2017-05-03 DIAGNOSIS — R112 Nausea with vomiting, unspecified: Secondary | ICD-10-CM

## 2017-05-03 DIAGNOSIS — F17200 Nicotine dependence, unspecified, uncomplicated: Secondary | ICD-10-CM

## 2017-05-03 DIAGNOSIS — F1210 Cannabis abuse, uncomplicated: Secondary | ICD-10-CM

## 2017-05-03 DIAGNOSIS — R079 Chest pain, unspecified: Secondary | ICD-10-CM

## 2017-05-03 DIAGNOSIS — R1013 Epigastric pain: Secondary | ICD-10-CM

## 2017-05-03 NOTE — Assessment & Plan Note (Addendum)
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

## 2017-05-03 NOTE — Assessment & Plan Note (Signed)
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

## 2017-05-03 NOTE — Assessment & Plan Note (Signed)
Quit smoking cigarettes   2016 or so - smoked for 20 yrs at 1.5ppd at most.   30 yr hx.   Still Smokes marijuana daily --> for 30-40 yrs. "

## 2017-05-03 NOTE — Progress Notes (Signed)
Impression and Recommendations:    1. Exertional epigastric/chest pain   2. H/O: substance abuse   3. Nausea and vomiting, intractability of vomiting not specified, unspecified vomiting type   4. Epigastric abdominal pain   5. Drug abuse, smokes marijuana qd   6. Obesity, Class I, BMI 30-34.9   7. 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   30 yr hx.   Still Smokes marijuana daily --> for 30-40 yrs. "   The patient was counseled, risk factors were discussed, anticipatory guidance given.   New Prescriptions   No medications on file     Discontinued Medications   No medications on file     Modified Medications   No medications on file      Orders Placed This Encounter  Procedures  . ECHOCARDIOGRAM STRESS TEST     Gross side effects, risk and benefits, and alternatives of medications and treatment plan in general discussed with patient.  Patient is aware that all medications have potential side effects and we are unable to predict every side effect or drug-drug  interaction that may occur.   Patient will call with any questions prior to using medication if they have concerns.  Expresses verbal understanding and consents to current therapy and treatment regimen.  No barriers to understanding were identified.  Red flag symptoms and signs discussed in detail.  Patient expressed understanding regarding what to do in case of emergency\urgent symptoms  Please see AVS handed out to patient at the end of our visit for further patient instructions/ counseling done pertaining to today's office visit.   Return for f/up after studies completed within 2 wks.     Note: This document was prepared using Dragon voice recognition software and may include unintentional dictation errors.  Pau Banh 10:00 AM --------------------------------------------------------------------------------------------------------------------------------------------------------------------------------------------------------------------------------------------    Subjective:    CC:  Chief Complaint  Patient presents with  . Follow-up    HPI: STONEY KARCZEWSKI is a 41 y.o. male who presents to [place] at [facility] today for issues as discussed below.  Martin Majestic To see Dr. Fuller Plan of gastroenterology for his stomach problems.  They did an endoscopy and patient was found to have completely normal tissue.  There is no findings to explain patient's symptoms.    Dr. Fuller Plan told patient to: Continue omeprazole 40 mg twice daily taken 30 minutes before breakfast and dinner. Continue ranitidine 300 mg at bedtime. Continue Carafate when necessary and Zofran when necessary as prescribed.   Today patient tells me that when he  exerts himself such as having sex, he gets sick on his stomach and it makes him vomit.   Today he tells me that any type of exertion will make his nausea and his stomach increase.  Even walking up stairs, physically exerting himself, carrying grocery bags  from the supermarket, mowing the yard or having sex.     Since our last office visit I asked him to try to pay attention to what makes the symptoms worse and makes them better and he has noticed a pattern that any type of exertion makes it worse.  He also notes that when her matter what food he eats does not make it worse or better, drinking or empty stomach not worse or better, the Zantac and omeprazole does not make it worse or better.  The time of day does not make it worse or better.  He notices no connection other than the exertional exerting himself brings on the symptoms worse.  Now symptoms are mostly constant throughout the day and much worse with activity.  It gets more intense with more aggressive activity.  -Patient relates that he did forget to take his GERD medicines only one night and he woke up the next morning with some heartburn.  But he knows that this felt very distinctly different than the epigastric sharp pains that he gets with nausea and vomiting associated with exercise and intense activity   Problem  Exertional epigastric/chest pain  H/O: Substance Abuse  h/o TOBACCO USER   Qualifier: Diagnosis of  By: Samara Snide   Quit smoking cig 2 yrs ago - smoked for 20 yrs at 1.5ppd at most.   89 yr hx.   Still Smokes marijuana daily --> for 30-40 yrs. "   Nausea and Vomiting  ?able Drug-seeking behavior (Resolved)     Wt Readings from Last 3 Encounters:  05/03/17 213 lb 12.8 oz (97 kg)  04/26/17 214 lb (97.1 kg)  04/25/17 214 lb 6.4 oz (97.3 kg)   BP Readings from Last 3 Encounters:  05/03/17 132/82  04/26/17 126/69  04/25/17 124/84   Pulse Readings from Last 3 Encounters:  05/03/17 68  04/26/17 62  04/25/17 70   BMI Readings from Last 3 Encounters:  05/03/17 30.90 kg/m  04/26/17 31.60 kg/m  04/25/17 30.98 kg/m     Patient Care Team    Relationship Specialty Notifications Start End  Mellody Dance, DO PCP - General Family Medicine  04/26/16       Patient Active Problem List   Diagnosis Date Noted  . Obesity, Class I, BMI 30-34.9 02/22/2017    Priority: High  . Drug abuse, smokes marijuana qd 02/22/2017    Priority: High  . Chronic heartburn 02/22/2017    Priority: High  . Adjustment disorder with mixed anxiety and depressed mood 07/04/2007    Priority: High  . Exertional epigastric/chest pain 05/03/2017    Priority: Medium  . H/O: substance abuse 05/03/2017    Priority: Medium  . Elevated triglycerides with high cholesterol 03/22/2017    Priority: Medium  . Low serum HDL 03/22/2017    Priority: Medium  . h/o TOBACCO USER 05/10/2009    Priority: Medium  . h/o chronic LOW BACK PAIN 07/10/2008    Priority: Medium  . Nausea and vomiting 04/09/2017    Priority: Low  . Epigastric abdominal pain 02/22/2017    Priority: Low  . Myalgia 03/22/2017  . h/o Seizures ([place]) 04/26/2016  . History of many Head concussions 04/26/2016  . h/o Chronic  neck pain 04/26/2016  . History of shingles 04/26/2016  . Neuralgia, postherpetic- R L Ext 04/26/2016  . Edema of right lower extremity 04/26/2016  . h/o Weakness of left arm 07/14/2013  . h/o Paresthesia of left arm 07/14/2013  . h/o Neck pain on left side 03/17/2013  . h/o Pain in joint, R shoulder region 03/17/2013  . Acute confusional state 12/18/2012  . h/o Migraine 02/15/2009    Past Medical history, Surgical history, Family history, Social history, Allergies and Medications have been entered into the medical record, reviewed and changed as needed.    Current Meds  Medication Sig  . alum & mag hydroxide-simeth (MAALOX ADVANCED MAX ST) 400-400-40 MG/5ML suspension Take 10 mLs by mouth every 6 (six) hours as needed for indigestion.  . cyclobenzaprine (FLEXERIL) 10 MG tablet Take 1 tablet (10 mg total) by mouth 3 (three) times daily as needed for muscle spasms.  Marland Kitchen omeprazole (PRILOSEC) 40 MG capsule Take before breakfast and dinner  . ondansetron (ZOFRAN-ODT) 8 MG  disintegrating tablet Take 1 tablet (8 mg total) by mouth every 8 (eight) hours as needed for nausea or vomiting.  . ranitidine (ZANTAC) 300 MG tablet Take 1 tablet (300 mg total) by mouth at bedtime.   Current Facility-Administered Medications for the 05/03/17 encounter (Office Visit) with Mellody Dance, DO  Medication  . 0.9 %  sodium chloride infusion    Allergies:  No Known Allergies   Review of Systems: General:   Denies fever, chills, unexplained weight loss.  Optho/Auditory:   Denies visual changes, blurred vision/LOV Respiratory:   Denies wheeze, DOE more than baseline levels.  Cardiovascular:   Denies chest pain, palpitations, new onset peripheral edema  Gastrointestinal:   +nausea, + vomiting, Only occ diarrhea, abd pain.  Genitourinary: Denies dysuria, freq/ urgency, flank pain or discharge from genitals.  Endocrine:     Denies hot or cold intolerance, polyuria, polydipsia. Musculoskeletal:   Denies unexplained myalgias, joint swelling, unexplained arthralgias, gait problems.  Skin:  Denies new onset rash, suspicious lesions Neurological:     Denies dizziness, unexplained weakness, numbness  Psychiatric/Behavioral:   Denies mood changes, suicidal or homicidal ideations, hallucinations    Objective:   Blood pressure 132/82, pulse 68, height 5' 9.75" (1.772 m), weight 213 lb 12.8 oz (97 kg). Body mass index is 30.9 kg/m. General:  Well Developed, well nourished, appropriate for stated age.  Neuro:  Alert and oriented,  extra-ocular muscles intact  HEENT:  Normocephalic, atraumatic, neck supple, no carotid bruits appreciated  Skin:  no gross rash, warm, pink. Cardiac:  RRR, S1 S2 Respiratory:  ECTA B/L and A/P, Not using accessory muscles, speaking in full sentences- unlabored. Vascular:  Ext warm, no cyanosis apprec.; cap RF less 2 sec. Psych:  No HI/SI, judgement and insight good, Euthymic mood. Full Affect.

## 2017-05-03 NOTE — Patient Instructions (Signed)
Exercise Stress Echocardiogram An exercise stress echocardiogram is a test to check how well your heart is working. This test uses sound waves (ultrasound) and a computer to make images of your heart before and after exercise. Ultrasound images that are taken before you exercise (your resting echocardiogram) will show how much blood is getting to your heart muscle and how well your heart muscle and heart valves are functioning. During the next part of this test, you will walk on a treadmill or ride a stationary bike to see how exercise affects your heart. While you exercise, the electrical activity of your heart will be monitored with an electrocardiogram (ECG). Your blood pressure will also be monitored. You may have this test if you:  Have chest pain or other symptoms of a heart problem.  Recently had a heart attack or heart surgery.  Have heart valve problems.  Have a condition that causes narrowing of the blood vessels that supply your heart (coronary artery disease).  Have a high risk of heart disease and are starting a new exercise program.  Have a high risk of heart disease and need to have major surgery.  Tell a health care provider about:  Any allergies you have.  All medicines you are taking, including vitamins, herbs, eye drops, creams, and over-the-counter medicines.  Any problems you or family members have had with anesthetic medicines.  Any blood disorders you have.  Any surgeries you have had.  Any medical conditions you have.  Whether you are pregnant or may be pregnant. What are the risks? Generally, this is a safe procedure. However, problems may occur, including:  Chest pain.  Dizziness or light-headedness.  Shortness of breath.  Increased or irregular heartbeat (palpitations).  Nausea or vomiting.  Heart attack (very rare).  What happens before the procedure?  Follow instructions from your health care provider about eating or drinking  restrictions. You may be asked to avoid all forms of caffeine for 24 hours before your procedure, or as told by your health care provider.  Ask your health care provider about changing or stopping your regular medicines. This is especially important if you are taking diabetes medicines or blood thinners.  If you use an inhaler, bring it with you to the test.  Wear loose, comfortable clothing and walking shoes.  Do notuse any products that contain nicotine or tobacco, such as cigarettes and e-cigarettes, for 4 hours before the test or as told by your health care provider. If you need help quitting, ask your health care provider. What happens during the procedure?  You will take off your clothes from the waist up and put on a hospital gown.  A technician will place electrodes on your chest.  A blood pressure cuff will be placed on your arm.  You will lie down on a table for an ultrasound exam before you exercise. Gel will be rubbed on your chest, and a handheld device (transducer) will be pressed against your chest and moved over your heart.  Then, you will start exercising by walking on a treadmill or pedaling a stationary bicycle.  Your blood pressure and heart rhythm will be monitored while you exercise.  The exercise will gradually get harder or faster.  You will exercise until: ? Your heart reaches a target level. ? You are too tired to continue. ? You cannot continue because of chest pain, weakness, or dizziness.  You will have another ultrasound exam after you stop exercising. The procedure may vary among health  care providers and hospitals. What happens after the procedure?  Your heart rate and blood pressure will be monitored until they return to your normal levels. Summary  An exercise stress echocardiogram is a test that uses ultrasound to check how well your heart works before and after exercise.  Before the test, follow instructions from your health care provider  about stopping medications, avoiding nicotine and tobacco, and avoiding certain foods and drinks.  During the test, your blood pressure and heart rhythm will be monitored while you exercise on a treadmill or stationary bicycle. This information is not intended to replace advice given to you by your health care provider. Make sure you discuss any questions you have with your health care provider. Document Released: 08/25/2004 Document Revised: 04/12/2016 Document Reviewed: 04/12/2016 Elsevier Interactive Patient Education  2018 [employer].

## 2017-05-15 ENCOUNTER — Telehealth (HOSPITAL_COMMUNITY): Payer: Self-pay | Admitting: Family Medicine

## 2017-05-15 ENCOUNTER — Other Ambulatory Visit: Payer: Self-pay

## 2017-05-15 DIAGNOSIS — R079 Chest pain, unspecified: Secondary | ICD-10-CM

## 2017-05-15 DIAGNOSIS — R569 Unspecified convulsions: Secondary | ICD-10-CM

## 2017-05-15 NOTE — Telephone Encounter (Signed)
I called Dr. Hershal Coria office and spoke with her nurse Lenna Sciara about placing an order for a complete echo to get baseline pictures before the patient comes in for his Stress Echo. She stated that she would be putting in the order for the echo right away.

## 2017-05-24 ENCOUNTER — Ambulatory Visit: Payer: Self-pay | Admitting: Gastroenterology

## 2017-05-31 ENCOUNTER — Telehealth (HOSPITAL_COMMUNITY): Payer: Self-pay | Admitting: *Deleted

## 2017-05-31 NOTE — Telephone Encounter (Signed)
Patient given detailed instructions per Stress Test Requisition Sheet for test on 06/05/17 at 7:30.Patient Notified to arrive 30 minutes early, and that it is imperative to arrive on time for appointment to keep from having the test rescheduled.  Patient verbalized understanding. Veronia Beets

## 2017-06-04 ENCOUNTER — Ambulatory Visit (HOSPITAL_COMMUNITY): Payer: Self-pay | Attending: Internal Medicine

## 2017-06-04 ENCOUNTER — Other Ambulatory Visit: Payer: Self-pay

## 2017-06-04 DIAGNOSIS — E785 Hyperlipidemia, unspecified: Secondary | ICD-10-CM | POA: Insufficient documentation

## 2017-06-04 DIAGNOSIS — Z87891 Personal history of nicotine dependence: Secondary | ICD-10-CM | POA: Insufficient documentation

## 2017-06-04 DIAGNOSIS — I517 Cardiomegaly: Secondary | ICD-10-CM | POA: Insufficient documentation

## 2017-06-04 DIAGNOSIS — R569 Unspecified convulsions: Secondary | ICD-10-CM | POA: Insufficient documentation

## 2017-06-04 DIAGNOSIS — F1910 Other psychoactive substance abuse, uncomplicated: Secondary | ICD-10-CM | POA: Insufficient documentation

## 2017-06-04 DIAGNOSIS — Z6830 Body mass index (BMI) 30.0-30.9, adult: Secondary | ICD-10-CM | POA: Insufficient documentation

## 2017-06-04 DIAGNOSIS — E669 Obesity, unspecified: Secondary | ICD-10-CM | POA: Insufficient documentation

## 2017-06-04 DIAGNOSIS — R079 Chest pain, unspecified: Secondary | ICD-10-CM | POA: Insufficient documentation

## 2017-06-05 ENCOUNTER — Other Ambulatory Visit: Payer: Self-pay | Admitting: Family Medicine

## 2017-06-05 ENCOUNTER — Ambulatory Visit (HOSPITAL_COMMUNITY): Payer: Self-pay | Attending: Cardiology

## 2017-06-05 ENCOUNTER — Ambulatory Visit (HOSPITAL_COMMUNITY): Payer: Self-pay

## 2017-06-05 DIAGNOSIS — R079 Chest pain, unspecified: Secondary | ICD-10-CM

## 2017-06-05 DIAGNOSIS — R1013 Epigastric pain: Secondary | ICD-10-CM

## 2017-06-05 DIAGNOSIS — R112 Nausea with vomiting, unspecified: Secondary | ICD-10-CM

## 2017-06-05 DIAGNOSIS — Z87898 Personal history of other specified conditions: Secondary | ICD-10-CM | POA: Insufficient documentation

## 2017-06-05 DIAGNOSIS — F1911 Other psychoactive substance abuse, in remission: Secondary | ICD-10-CM

## 2017-07-19 ENCOUNTER — Ambulatory Visit: Payer: Self-pay | Admitting: Family Medicine

## 2017-09-18 ENCOUNTER — Encounter: Payer: Self-pay | Admitting: Family Medicine

## 2017-09-18 ENCOUNTER — Ambulatory Visit (INDEPENDENT_AMBULATORY_CARE_PROVIDER_SITE_OTHER): Payer: Self-pay | Admitting: Family Medicine

## 2017-09-18 VITALS — BP 128/85 | HR 62 | Temp 98.5°F | Ht 69.75 in | Wt 228.3 lb

## 2017-09-18 DIAGNOSIS — R05 Cough: Secondary | ICD-10-CM

## 2017-09-18 DIAGNOSIS — R059 Cough, unspecified: Secondary | ICD-10-CM

## 2017-09-18 DIAGNOSIS — J019 Acute sinusitis, unspecified: Secondary | ICD-10-CM

## 2017-09-18 DIAGNOSIS — H6592 Unspecified nonsuppurative otitis media, left ear: Secondary | ICD-10-CM

## 2017-09-18 MED ORDER — GUAIFENESIN-CODEINE 100-10 MG/5ML PO SOLN: 5.0000 mL | Freq: Every evening | ORAL | 0 refills | Status: DC | PRN

## 2017-09-18 MED ORDER — AMOXICILLIN-POT CLAVULANATE 875-125 MG PO TABS: 1.0000 | ORAL_TABLET | Freq: Two times a day (BID) | ORAL | 0 refills | Status: DC

## 2017-09-18 NOTE — Patient Instructions (Signed)
You most likely have a viral infection that should resolve on its own over time (or this could be a flare of seasonal allergies as well).   You can use over-the-counter afrin nasal spray for up to 3 days (NO longer than that) which will help acutely with nasal drainage/ congestion short term.   Also, sterile saline nasal rinses, such as Milta Deiters med or AYR sinus rinses, can be very helpful and should be done twice daily- especially throughout the allergy season.   Remember you should use distilled water or previously boiled water to do this.   You can also use an over the counter cold and flu medication such as Tylenol Severe Cold and Sinus/Flu or Dayquil, Nyquil and the like, which will help with cough, congestion, headache/ pain, fevers/chills etc.  Please note, if you being treated for hypertension or have high blood pressure, you should be using the cold meds designated "HBP".    Wash your hands frequently, as you did not want to get those around you sick as well. Never sneeze or cough on others.  And you should not be going to school or work if you are running a temperature of 100.5 or more on two separate occasions.   Drink plenty of fluids and stay hydrated, especially if you are running fevers.  We don't know why, but chicken soup also helps, try it! :)

## 2017-09-18 NOTE — Progress Notes (Signed)
Acute Care Office visit  Assessment and plan:  1. OME (otitis media with effusion), left   2. Cough   3. Acute non-recurrent sinusitis, unspecified location     1) L-otitis media with effusion. Take Abx prescribed as listed below, 1/day for 10 days.  If symptoms worsen in 3-4 days, call the office.  2) Cough. Meds prescribed for cough- use PRN only since otc cough meds have not been helpful and pt not sleeping.   3) Acute non-recurrent Sinusitis . Take OTC medications like decongestants and ibuprofen, tylenol for symptom and pain relief. Recommended use of Neti pot or Ayr for sinus rinses- BID. Recommended use of Vick's Vaporub for additional symptom relief. Drink plenty of fluids and get plenty of rest.  - Viral vs Allergic vs Bacterial causes for pt's symptoms reveiwed.    - Supportive care and various OTC medications discussed in addition to any prescribed. - Call or RTC if new symptoms, or if no improvement or worse over next several days.      Meds ordered this encounter  Medications  . amoxicillin-clavulanate (AUGMENTIN) 875-125 MG tablet    Sig: Take 1 tablet by mouth 2 (two) times daily.    Dispense:  20 tablet    Refill:  0  . guaiFENesin-codeine 100-10 MG/5ML syrup    Sig: Take 5 mLs by mouth at bedtime as needed for cough.    Dispense:  120 mL    Refill:  0    Gross side effects, risk and benefits, and alternatives of medications discussed with patient.  Patient is aware that all medications have potential side effects and we are unable to predict every sideeffect or drug-drug interaction that may occur.  Expresses verbal understanding and consents to current therapy plan and treatment regiment.   Education and routine counseling performed. Handouts provided.  Anticipatory guidance and routine counseling done re: condition, txmnt options and need for follow up. All questions of patient's were answered.  Return if symptoms worsen or fail to improve.  Please see  AVS handed out to patient at the end of our visit for additional patient instructions/ counseling done pertaining to today's office visit.  Note: This document was partially repared using Dragon voice recognition software and may include unintentional dictation errors.  This document serves as a record of services personally performed by Mellody Dance, DO. It was created on her behalf by Mayer Masker, a trained medical scribe. The creation of this record is based on the scribe's personal observations and the provider's statements to them.   I have reviewed the above medical documentation for accuracy and completeness and I concur.  Mellody Dance 09/18/17 2:49 PM    Subjective:    Chief Complaint  Patient presents with  . Cough    productive cought, lt earache, fatigue, headaches x 6-7 days    HPI:  Pt presents with Sx for 5-6 days.   C/o:   Head Congestion.  He has associated cough,  L ear pain,  L-sided facial pain,  tiredness, and HA.  Denies: fever (98.6)    For symptoms patient has tried:  Robitussin, tylenol with mild relief.  Sick contacts: girlfriend and child also have ear infections   Overall getting:  Same- not improving.  Pt has not smoked cigarettes in 3-4 years.  Patient Care Team    Relationship Specialty Notifications Start End  Mellody Dance, DO PCP - General Family Medicine  04/26/16     Past medical history, Surgical history,  Family history reviewed and noted below, Social history, Allergies, and Medications have been entered into the medical record, reviewed and changed as needed.   No Known Allergies  Review of Systems: - see above HPI for pertinent positives General:   No F/C, wt loss Pulm:   No DIB, pleuritic chest pain Card:  No CP, palpitations Abd:  No n/v/d or pain Ext:  No inc edema from baseline   Objective:   Blood pressure 128/85, pulse 62, temperature 98.5 F (36.9 C), height 5' 9.75" (1.772 m), weight 228 lb 4.8 oz (103.6  kg), SpO2 98 %. Body mass index is 32.99 kg/m. General: Well Developed, well nourished, appropriate for stated age.  Neuro: Alert and oriented x3, extra-ocular muscles intact, sensation grossly intact.  HEENT: Normocephalic, atraumatic, pupils equal round reactive to light, neck supple, no masses, no painful lymphadenopathy, Nares- patent, clear d/c, OP- clear, mild erythema EARS: Opacity and bulging of left TM with air fluid levels. Tender to tympanic compression of left maxillary sinus, left frontal sinus. Skin: Warm and dry, no gross rash. Cardiac: RRR, S1 S2,  no murmurs rubs or gallops.  Respiratory: ECTA B/L and A/P, Not using accessory muscles, speaking in full sentences- unlabored. Vascular:  No gross lower ext edema, cap RF less 2 sec. Psych: No HI/SI, judgement and insight good, Euthymic mood. Full Affect.

## 2017-11-02 ENCOUNTER — Encounter: Payer: Self-pay | Admitting: Family Medicine

## 2017-11-02 ENCOUNTER — Ambulatory Visit (INDEPENDENT_AMBULATORY_CARE_PROVIDER_SITE_OTHER): Payer: Self-pay | Admitting: Family Medicine

## 2017-11-02 VITALS — BP 138/86 | HR 61 | Temp 98.3°F | Ht 69.75 in | Wt 235.0 lb

## 2017-11-02 DIAGNOSIS — J019 Acute sinusitis, unspecified: Secondary | ICD-10-CM

## 2017-11-02 DIAGNOSIS — R05 Cough: Secondary | ICD-10-CM

## 2017-11-02 DIAGNOSIS — R0982 Postnasal drip: Secondary | ICD-10-CM

## 2017-11-02 DIAGNOSIS — R059 Cough, unspecified: Secondary | ICD-10-CM

## 2017-11-02 MED ORDER — HYDROCOD POLST-CPM POLST ER 10-8 MG/5ML PO SUER: 5.0000 mL | Freq: Two times a day (BID) | ORAL | 0 refills | Status: DC | PRN

## 2017-11-02 NOTE — Patient Instructions (Addendum)
  Symptoms for a upper respiratory tract infection usually last 3-7 days but can stretch out to 2-3 weeks before you're feeling back to normal.  Your symptoms should not worsen after 7-10 days and if they do, please notify our office, as you may need additional evaluation and/or antibiotics.  --> Please let me know if you are getting worse by next Wednesday and or no improvement by next Friday then we should start antibiotics prophylactically.  You can use over-the-counter afrin nasal spray for up to 3 days (NO longer than that) which will help acutely with nasal drainage/ congestion short term.   Also, sterile saline nasal rinses, such as Milta Deiters med or AYR sinus rinses, can be very helpful and should be done twice daily- especially throughout the allergy season.   Remember you should use distilled water or previously boiled water to do this.  Then you may use over-the-counter Flonase 1 spray each nostril twice daily after sinus rinses.   You can also use an over the counter cold and flu medication such as Tylenol Severe Cold and Sinus/Flu or Dayquil, Nyquil and the like, which will help with cough, congestion, headache/ pain, fevers/chills etc.  Please note, if you being treated for hypertension or have high blood pressure, you should be using the cold meds designated "HBP".    Wash your hands frequently, as you did not want to get those around you sick as well. Never sneeze or cough on others.  And you should not be going to school or work if you are running a temperature of 100.5 or more on two separate occasions.   Drink plenty of fluids and stay hydrated, especially if you are running fevers.  We don't know why, but chicken soup also helps, try it! :)

## 2017-11-02 NOTE — Progress Notes (Signed)
Acute Care Office visit  Assessment and plan:  1. Acute rhinosinusitis   2. Cough in adult   3. Post-nasal drainage    - Viral vs Allergic vs Bacterial causes for pt's symptoms reveiwed.    - Supportive care and various OTC medications discussed in addition to any prescribed. - Call or RTC if new symptoms, or if no improvement or worse over next several days.   - Will consider ABX if sx continue past 10 days and worsening if not already given.   1. Rhinosinusitis - Advised the patient to use AYR or Neilmed sinus rinses BID followed by flonase BID (one spray to each nostril).  Use distilled water or previously boiled water to perform these rinses.  Advised that the patient may also incorporate allegra or claritin PRN.  Per patient, flonase is available at his house.    - Tussionex prescribed for cough and as a way to aid sleeping at night. - Advised patient not to use the tussionex during the day, as it causes sleepiness.  - Supportive care recommended.  Patient should push fluids, consume adequate nutrition, and rest when he feels the need to rest.  - OTC DayQuil, NyQuil, cold relief medicine may be used PRN.  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  - Reviewed that his symptoms should not worsen after 7-10 days.  This means that he should return Wednesday of next week (5-6 days from now) if he is not feeling any better.  If he begins feeling worse, he should call in to the clinic for additional evaluation and/or antibiotics.  - Emphasized that the patient should follow up with his regular appointments as well.  Meds ordered this encounter  Medications  . chlorpheniramine-HYDROcodone (TUSSIONEX) 10-8 MG/5ML SUER    Sig: Take 5 mLs by mouth every 12 (twelve) hours as needed for cough (cough, will cause drowsiness.).    Dispense:  120 mL      Refill:  0    No orders of the defined types were placed in this encounter.   Gross side effects, risk and benefits, and alternatives of medications discussed with patient.  Patient is aware that all medications have potential side effects and we are unable to predict every sideeffect or drug-drug interaction that may occur.  Expresses verbal understanding and consents to current therapy plan and treatment regiment.   Education and routine counseling performed. Handouts provided.  Anticipatory guidance and routine counseling done re: condition, txmnt options and need for follow up. All questions of patient's were answered.  Return if symptoms worsen or fail to improve.  Please see AVS handed out to patient at the end of our visit for additional patient instructions/ counseling done pertaining to today's office visit.  Note: This document was partially repared using Dragon voice recognition software and may include unintentional dictation errors.  This document serves as a record of services personally performed by Mellody Dance, DO. It was created on her behalf by Toni Amend, a trained medical scribe. The creation of this record is based on the scribe's personal observations and the provider's statements to them.   I have reviewed the above medical documentation for accuracy and completeness and I concur.  Mellody Dance 11/02/17 12:56 PM    Subjective:    Chief Complaint  Patient presents with  . Cough    nonproductive  cough and congestions x 5 days     HPI:  Pt presents with Sx for 4-5 days total, especially since two days ago.  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  Had chills on Wednesday (2 days ago), but isn't sure if it was due to illness.  Has "felt fine with a lot of it," but the coughing is keeping him up at night.   Notes sore throat and drainage also.  Mainly concerned about losing his voice, because he has to shout at work.  Rarely has the feeling of "needing to suck a little harder to get a deep breath."  Continues smoking bongs at home.  Denies: Severe body aches.    For symptoms patient has tried:  Robitussin DM.  This is not helping his cough at night.  He has been using it daytime and night time.  Tylenol is helping him manage his pain a little bit better.  Patient notes that he gets red-faced, and his body heats up when he takes steroids.  Overall getting:   Notes that his feelings of malaise come and go.   Patient Care Team    Relationship Specialty Notifications Start End  Mellody Dance, DO PCP - General Family Medicine  04/26/16     Past medical history, Surgical history, Family history reviewed and noted below, Social history, Allergies, and Medications have been entered into the medical record, reviewed and changed as needed.   No Known Allergies  Review of Systems: - see above HPI for pertinent positives General:   No F/C, wt loss Pulm:   No DIB, pleuritic chest pain Card:  No CP, palpitations Abd:  No n/v/d or pain Ext:  No inc edema from baseline   Objective:   Blood pressure 138/86, pulse 61, temperature 98.3 F (36.8 C), height 5' 9.75" (1.772 m), weight 235 lb (106.6 kg), SpO2 97 %. Body mass index is 33.96 kg/m. General: Well Developed, well nourished, appropriate for stated age.  Neuro: Alert and oriented x3, extra-ocular muscles intact, sensation grossly intact.  HEENT: Normocephalic, atraumatic, pupils equal round reactive to light, neck supple, no masses, no painful lymphadenopathy, TM's intact B/L, no acute findings. Nares- patent, clear d/c, OP- clear, mild erythema, No TTP sinuses Skin: Warm and dry, no gross rash. Cardiac: RRR, S1 S2,  no murmurs rubs or gallops.  Respiratory: ECTA B/L and A/P, Not using accessory muscles, speaking in full sentences-  unlabored. Vascular:  No gross lower ext edema, cap RF less 2 sec. Psych: No HI/SI, judgement and insight good, Euthymic mood. Full Affect.

## 2017-11-09 ENCOUNTER — Telehealth: Payer: Self-pay | Admitting: Family Medicine

## 2017-11-09 ENCOUNTER — Other Ambulatory Visit: Payer: Self-pay

## 2017-11-09 DIAGNOSIS — J069 Acute upper respiratory infection, unspecified: Secondary | ICD-10-CM

## 2017-11-09 MED ORDER — AMOXICILLIN-POT CLAVULANATE 875-125 MG PO TABS: 1.0000 | ORAL_TABLET | Freq: Two times a day (BID) | ORAL | 0 refills | Status: DC

## 2017-11-09 NOTE — Telephone Encounter (Signed)
Patient states his symptoms are continued from last week's OV --  Provider told him to call office if no improvement ---Pt requested cough med refill be called into [facility]---  Pls call pt if any questions  [phone].  -glh

## 2017-11-09 NOTE — Progress Notes (Signed)
Per Dr. Raliegh Scarlet called in Augmentin due to symptoms getting worse, no refill on the cough medication as the patient should still have some.  Patient states that he is now having a worse cough that is productive and the congestion has increased.  Patient states that he is good on cough medication. Patient will follow up as needed. MPulliam, CMA/RT(R)

## 2017-11-09 NOTE — Telephone Encounter (Signed)
Per Dr. Raliegh Scarlet antibiotic if symptoms are worse, no refill on antibiotics.  Please see not in chart for medication sent in (antibotic) due to worsened symptoms. Patient notified. MPulliam, CMA/RT(R)

## 2018-01-15 ENCOUNTER — Other Ambulatory Visit: Payer: Self-pay

## 2018-01-15 ENCOUNTER — Emergency Department (HOSPITAL_COMMUNITY)
Admission: EM | Admit: 2018-01-15 | Discharge: 2018-01-15 | Disposition: A | Discharge status: Home / Self Care | Payer: Self-pay | Attending: Emergency Medicine | Admitting: Emergency Medicine

## 2018-01-15 ENCOUNTER — Encounter (HOSPITAL_COMMUNITY): Payer: Self-pay | Admitting: Emergency Medicine

## 2018-01-15 DIAGNOSIS — H5711 Ocular pain, right eye: Secondary | ICD-10-CM | POA: Insufficient documentation

## 2018-01-15 DIAGNOSIS — Z87891 Personal history of nicotine dependence: Secondary | ICD-10-CM | POA: Insufficient documentation

## 2018-01-15 DIAGNOSIS — Z79899 Other long term (current) drug therapy: Secondary | ICD-10-CM | POA: Insufficient documentation

## 2018-01-15 MED ORDER — FLUORESCEIN SODIUM 1 MG OP STRP
1.0000 | ORAL_STRIP | Freq: Once | OPHTHALMIC | Status: AC
  Administered 2018-01-15: 1 via OPHTHALMIC
  Filled 2018-01-15: qty 1

## 2018-01-15 MED ORDER — MORPHINE SULFATE (PF) 4 MG/ML IV SOLN
4.0000 mg | Freq: Once | INTRAVENOUS | Status: DC
  Filled 2018-01-15: qty 1

## 2018-01-15 MED ORDER — METOCLOPRAMIDE HCL 5 MG/ML IJ SOLN
10.0000 mg | Freq: Once | INTRAMUSCULAR | Status: AC
  Administered 2018-01-15: 10 mg via INTRAVENOUS
  Filled 2018-01-15: qty 2

## 2018-01-15 MED ORDER — TETRACAINE HCL 0.5 % OP SOLN
2.0000 [drp] | Freq: Once | OPHTHALMIC | Status: AC
  Administered 2018-01-15: 2 [drp] via OPHTHALMIC
  Filled 2018-01-15: qty 4

## 2018-01-15 MED ORDER — DIPHENHYDRAMINE HCL 50 MG/ML IJ SOLN
25.0000 mg | Freq: Once | INTRAMUSCULAR | Status: AC
  Administered 2018-01-15: 25 mg via INTRAVENOUS
  Filled 2018-01-15: qty 1

## 2018-01-15 MED ORDER — KETOROLAC TROMETHAMINE 15 MG/ML IJ SOLN
15.0000 mg | Freq: Once | INTRAMUSCULAR | Status: AC
  Administered 2018-01-15: 15 mg via INTRAVENOUS
  Filled 2018-01-15: qty 1

## 2018-01-15 MED ORDER — SODIUM CHLORIDE 0.9 % IV BOLUS
1000.0000 mL | Freq: Once | INTRAVENOUS | Status: AC
  Administered 2018-01-15: 1000 mL via INTRAVENOUS

## 2018-01-15 NOTE — Discharge Instructions (Signed)
Call Dr. Trena Platt for [place] to schedule an appointment to further evaluate your right eye.  Get your blood pressure rechecked within the next 3 weeks.  Today's was mildly elevated at 154/98

## 2018-01-15 NOTE — ED Notes (Signed)
HAD ALREADY PULLED MEDICATION UP  INTO [facility] .HAD TO WASTE MEDICATION WITH MIkE RN. IN SINK.

## 2018-01-15 NOTE — ED Provider Notes (Addendum)
[facility] EMERGENCY DEPARTMENT Provider Note   CSN: [id number] Arrival date & time: 01/15/18  1025     History   Chief Complaint Chief Complaint  Patient presents with  . Eye Problem    HPI Micheal Roth is a 41 y.o. male.  HPI  42 year old male presents with right eye pain and blurry vision.  The patient states that yesterday he was cooking and thinks some smoke got into his eye.  About 15 minutes later his girlfriend noticed his inferior periorbital region of the right eye was swollen.  This morning the swelling seems to have shifted but now has been having some eye pain and blurry vision.  He states the blurry vision is mild like his got water in his eyes.  He is also developing a progressive headache.  No left-sided symptoms.  He has not noticed any external eye changes.  He does not think anything else specifically got in his eye.  Past Medical History:  Diagnosis Date  . Arthritis   . Chronic back pain   . Chronic left shoulder pain   . Chronic neck pain   . Depression   . GERD (gastroesophageal reflux disease)   . Migraines   . Paresthesia of left arm and leg    and weakness of left arm and leg  . Seizures [facility])     Patient Active Problem List   Diagnosis Date Noted  . Exertional epigastric/chest pain 05/03/2017  . H/O: substance abuse 05/03/2017  . Nausea and vomiting 04/09/2017  . Elevated triglycerides with high cholesterol 03/22/2017  . Low serum HDL 03/22/2017  . Myalgia 03/22/2017  . Obesity, Class I, BMI 30-34.9 02/22/2017  . Drug abuse, smokes marijuana qd 02/22/2017  . Chronic heartburn 02/22/2017  . Epigastric abdominal pain 02/22/2017  . h/o Seizures ([place]) 04/26/2016  . History of many Head concussions 04/26/2016  . h/o Chronic neck pain 04/26/2016  . History of shingles 04/26/2016  . Neuralgia, postherpetic- R L Ext 04/26/2016  . Edema of right lower extremity 04/26/2016  . h/o Weakness of left arm 07/14/2013  . h/o  Paresthesia of left arm 07/14/2013  . h/o Neck pain on left side 03/17/2013  . h/o Pain in joint, R shoulder region 03/17/2013  . Acute confusional state 12/18/2012  . h/o TOBACCO USER 05/10/2009  . h/o Migraine 02/15/2009  . h/o chronic LOW BACK PAIN 07/10/2008  . Adjustment disorder with mixed anxiety and depressed mood 07/04/2007    Past Surgical History:  Procedure Laterality Date  . BACK SURGERY    . KNEE SURGERY    . 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 Authorizing Provider  ibuprofen (ADVIL,MOTRIN) 200 MG tablet Take 200 mg by mouth every 6 (six) hours as needed for moderate pain.   Yes [provider]  omeprazole (PRILOSEC) 20 MG capsule Take 20 mg by mouth daily.   Yes [provider]  amoxicillin-clavulanate (AUGMENTIN) 875-125 MG tablet Take 1 tablet by mouth 2 (two) times daily. Patient not taking: Reported on 01/15/2018 11/09/17   Mellody Dance, DO  chlorpheniramine-HYDROcodone (TUSSIONEX) 10-8 MG/5ML SUER Take 5 mLs by mouth every 12 (twelve) hours as needed for cough (cough, will cause drowsiness.). Patient not taking: Reported on 01/15/2018 11/02/17   Mellody Dance, DO    Family History  Family History  Problem Relation Age of Onset  . Diabetes Father   . Diabetes Maternal Grandmother   . Heart disease Maternal Grandmother        CHF  . Heart attack Maternal Grandmother   . Crohn's disease Brother   . Colon cancer Neg Hx   . Colon polyps Neg Hx   . Esophageal cancer Neg Hx   . Stomach cancer Neg Hx   . Rectal cancer Neg Hx     Social History Social History   Tobacco Use  . Smoking status: Former Smoker    Types: Cigarettes    Last attempt to quit: 09/04/2013    Years since quitting: 4.3  . Smokeless tobacco: Never Used  Substance Use Topics  . Alcohol use: No    Comment: occasional  .  Drug use: Yes    Frequency: 2.0 times per week    Types: Marijuana     Allergies   Patient has no known allergies.   Review of Systems Review of Systems  Eyes: Positive for pain and visual disturbance. Negative for photophobia, discharge and redness.  Neurological: Positive for headaches.  All other systems reviewed and are negative.    Physical Exam Updated Vital Signs BP (!) 141/88 (BP Location: Right Arm)   Pulse 64   Temp 97.9 F (36.6 C) (Oral)   Resp 20   SpO2 98%   Physical Exam  Constitutional: He appears well-developed and well-nourished.  HENT:  Head: Normocephalic and atraumatic.  Right Ear: External ear normal.  Left Ear: External ear normal.  Nose: Nose normal.  No periorbital swelling or tenderness. No redness  Eyes: Pupils are equal, round, and reactive to light. EOM are normal. Right eye exhibits no discharge. Left eye exhibits no discharge. No scleral icterus.  No pain with EOM  Neck: Neck supple.  Pulmonary/Chest: Effort normal.  Abdominal: He exhibits no distension.  Musculoskeletal: He exhibits no edema.  Neurological: He is alert.  CN 3-12 grossly intact. 5/5 strength in all 4 extremities. Grossly normal sensation. Normal finger to nose.   Skin: Skin is warm and dry. No erythema.  Nursing note and vitals reviewed.    ED Treatments / Results  Labs (all labs ordered are listed, but only abnormal results are displayed) Labs Reviewed - No data to display  EKG None  Radiology No results found.  Procedures Procedures (including critical care time)  Medications Ordered in ED Medications  tetracaine (PONTOCAINE) 0.5 % ophthalmic solution 2 drop (2 drops Right Eye Given 01/15/18 1418)  fluorescein ophthalmic strip 1 strip (1 strip Right Eye Given 01/15/18 1418)  sodium chloride 0.9 % bolus 1,000 mL (0 mLs Intravenous Stopped 01/15/18 1550)  metoCLOPramide (REGLAN) injection 10 mg (10 mg Intravenous Given 01/15/18 1424)  ketorolac (TORADOL)  15 MG/ML injection 15 mg (15 mg Intravenous Given 01/15/18 1426)  diphenhydrAMINE (BENADRYL) injection 25 mg (25 mg Intravenous Given 01/15/18 1427)     Initial Impression / Assessment and Plan / ED Course  I have reviewed the triage vital signs and the nursing notes.  Pertinent labs & imaging results that were available during my care of the patient were reviewed by me and considered in my medical decision making (see chart for details).     Patient was evaluated with slit-lamp, Tono-Pen, and fluorescein staining.  There is no obvious large abrasion although there might be a slight floor seen uptake.  His eye pain felt better after tetracaine application but his headache was still present.  He does get migraines but usually left-sided.  This reminds him somewhat of a migraine.  There is no photophobia since I think traumatic iritis is less likely.  He was given a headache cocktail with moderate relief.  Given he still has some symptoms, I discussed with Dr. Rory Percy neurology who recommends MRI brain and orbits without contrast to help of rule out obvious CNS cause such as multiple sclerosis or mass.  This is negative he can follow-up with ophthalmology.  Care transferred to Dr. Cathleen Fears.  Final Clinical Impressions(s) / ED Diagnoses   Final diagnoses:  None    ED Discharge Orders    None       Sherwood Gambler, MD 01/15/18 1556  Addendum: IOP 19 w/ Morrell Riddle   Sherwood Gambler, MD 01/18/18 1455

## 2018-01-15 NOTE — ED Provider Notes (Signed)
5:05 PM complains of pain at right eye and right periorbital area.  Pain feels like migraines he had in the past he is alert Glasgow Coma Score 15 and in no distress.  HEENT exam no facial asymmetry eyes no subconjunctival erythema.  Pupils react normally.  Extraocular muscles intact.  Cranial nerves II through XII grossly intact.  Moves all extremities well.  Patient requesting pain medicine.  Reglan ordered   5:25 PM patient decided that he does not wish to stay for MRI.  I told him that we cannot exclude stroke or serious intracranial pathology without MRI.  He  is able to comprehend these concepts.  He does not feel improved after treatment with Reglan.  He is alert ambulatory Glasgow Coma Score 15 and appears comfortable.  He is invited to come back to the emergency department if worse for any reason.  I will refer him to ophthalmology would also suggest blood pressure recheck 3 weeks   Orlie Dakin, MD 01/15/18 1735

## 2018-01-15 NOTE — ED Notes (Signed)
Pt was very adiment about leaving and told the nurse that he felt he did not need the MRI and wanted top go home and rest. He would follow up with ophthalmologist like dr referred him to.

## 2018-01-15 NOTE — ED Triage Notes (Signed)
Pt reports he was cooking last night, got some smoke into his eye, noticed later that night that eye was red, swollen and he had some blurred vision. Pt still reports blurred vision and pain.

## 2018-01-23 ENCOUNTER — Encounter: Payer: Self-pay | Admitting: Family Medicine

## 2018-01-23 ENCOUNTER — Ambulatory Visit (INDEPENDENT_AMBULATORY_CARE_PROVIDER_SITE_OTHER): Payer: Self-pay | Admitting: Family Medicine

## 2018-01-23 VITALS — BP 130/79 | HR 65 | Ht 69.75 in | Wt 224.7 lb

## 2018-01-23 DIAGNOSIS — H5711 Ocular pain, right eye: Secondary | ICD-10-CM | POA: Insufficient documentation

## 2018-01-23 DIAGNOSIS — G43109 Migraine with aura, not intractable, without status migrainosus: Secondary | ICD-10-CM | POA: Insufficient documentation

## 2018-01-23 MED ORDER — SUMATRIPTAN-NAPROXEN SODIUM 85-500 MG PO TABS: ORAL_TABLET | ORAL | 0 refills | Status: DC

## 2018-01-23 NOTE — Progress Notes (Signed)
Pt here for an acute care OV today   Impression and Recommendations:    1. Ocular migraine   2. Ocular pain, right eye-  actually periorbital     1. Acute Right Eye Pain - Treatment prescribed for ocular migraine. - Handout provided today for ocular migraines.  - If patient obtains no relief with medication, recommended follow-up with neurology for further imaging.  - MRI recommended to the patient, but patient does not have the insurance to obtain this at this time.  Meds ordered this encounter  Medications  . SUMAtriptan-naproxen (TREXIMET) 85-500 MG tablet    Sig: One-half to one tab PO at the first sign of pain, may repeat x1 in 2 hours if pain still present.    Dispense:  10 tablet    Refill:  0     Education and routine counseling performed. Handouts provided  Gross side effects, risk and benefits, and alternatives of medications and treatment plan in general discussed with patient.  Patient is aware that all medications have potential side effects and we are unable to predict every side effect or drug-drug interaction that may occur.   Patient will call with any questions prior to using medication if they have concerns.  Expresses verbal understanding and consents to current therapy and treatment regimen.  No barriers to understanding were identified.  Red flag symptoms and signs discussed in detail.  Patient expressed understanding regarding what to do in case of emergency\urgent symptoms   Please see AVS handed out to patient at the end of our visit for further patient instructions/ counseling done pertaining to today's office visit.   Return if symptoms worsen or fail to improve, for F-up of current med issues as previously d/c pt.     Note: This document was prepared occasionally using Dragon voice recognition software and may include unintentional dictation errors in addition to a scribe.  This document serves as a record of services personally performed by  Mellody Dance, DO. It was created on her behalf by Toni Amend, a trained medical scribe. The creation of this record is based on the scribe's personal observations and the provider's statements to them.   I have reviewed the above medical documentation for accuracy and completeness and I concur.  Mellody Dance 01/25/18 12:46 PM   -------------------------------------------------------------------------------------------------------------------------------   Subjective:    CC:  Chief Complaint  Patient presents with  . Eye Pain    HPI: Micheal Roth is a 42 y.o. male who presents to [facility] today for issues as discussed below.  Seen in ED on 01/15/2018 for 9 hours (HPI and Assessment from ED visit included below).  Patient left AMA and did not want to get an MRI to further evaluate his right eye pain with right periorbital region pain.  He was referred to ophthalmology and told to follow up with [place] for blood pressure re-check.  ED doctor discussed case with neurology who recommended he gets an MRI of brain and orbits without contrast as an outpatient, and if negative, follow up with ophthalmology.  Acute Right Eye Pain Patient notes that pain started last Monday night (9 days ago).  Notes that he was grilling and got smoke in his eye prior to this.  His eye was swollen on Monday night.  He woke up the next morning with blurry vision and pain.  Notes that he was in the ED for 10 hours.  Decided to go home without  getting an MRI.  When he left the ER, his symptoms were not completely gone.  Notes that he was sent home from work today due to his eye feeling symptomatic (blurriness and pain) again.  Even while at home resting and stopping activity, his symptoms did not resolve today and his pain did not resolve.  Patient notes that the pain did resolve for a few days after his ED visit before returning to the office today.  Notes further that  nothing worsens the pain; not light or sound.  Describes the pain as a dull throbbing pain around his eye, and sometimes feels like he has a needle getting "poked" in the center of his eye.  Patient does work near [employer] and other activities, and notes the need to wear proper safety equipment while at work to prevent injury.  The patient did just start a new job, and notes that he could be under more acute stress lately.  HPI  from ED visit 01/15/2018: 42 year old male presents with right eye pain and blurry vision.  The patient states that yesterday he was cooking and thinks some smoke got into his eye.  About 15 minutes later his girlfriend noticed his inferior periorbital region of the right eye was swollen.  This morning the swelling seems to have shifted but now has been having some eye pain and blurry vision.  He states the blurry vision is mild like his got water in his eyes.  He is also developing a progressive headache.  No left-sided symptoms.  He has not noticed any external eye changes.  He does not think anything else specifically got in his eye.  ED Provider Notes 01/15/2018 Orlie Dakin, MD (Physician) . [facility]    5:05 PM complains of pain at right eye and right periorbital area.  Pain feels like migraines he had in the past he is alert Glasgow Coma Score 15 and in no distress.  HEENT exam no facial asymmetry eyes no subconjunctival erythema.  Pupils react normally.  Extraocular muscles intact.  Cranial nerves II through XII grossly intact.  Moves all extremities well.  Patient requesting pain medicine.  Reglan ordered   5:25 PM patient decided that he does not wish to stay for MRI.  I told him that we cannot exclude stroke or serious intracranial pathology without MRI.  He  is able to comprehend these concepts.  He does not feel improved after treatment with Reglan.  He is alert ambulatory Glasgow Coma Score 15 and appears comfortable.  He is invited to come back to  the emergency department if worse for any reason.  I will refer him to ophthalmology would also suggest blood pressure recheck 3 weeks   Orlie Dakin, MD 01/15/18 1735      Initial Impression / Assessment and Plan / ED Course  I have reviewed the triage vital signs and the nursing notes.  Pertinent labs & imaging results that were available during my care of the patient were reviewed by me and considered in my medical decision making (see chart for details).   Patient was evaluated with slit-lamp, Tono-Pen, and fluorescein staining.  There is no obvious large abrasion although there might be a slight floor seen uptake.  His eye pain felt better after tetracaine application but his headache was still present.  He does get migraines but usually left-sided.  This reminds him somewhat of a migraine.  There is no photophobia since I think traumatic iritis is less likely.  He  was given a headache cocktail with moderate relief.  Given he still has some symptoms, I discussed with Dr. Rory Percy neurology who recommends MRI brain and orbits without contrast to help of rule out obvious CNS cause such as multiple sclerosis or mass.  This is negative he can follow-up with ophthalmology.  Care transferred to Dr. Cathleen Fears.   No problems updated.   Wt Readings from Last 3 Encounters:  01/23/18 224 lb 11.2 oz (101.9 kg)  11/02/17 235 lb (106.6 kg)  09/18/17 228 lb 4.8 oz (103.6 kg)   BP Readings from Last 3 Encounters:  01/23/18 130/79  01/15/18 139/84  11/02/17 138/86   BMI Readings from Last 3 Encounters:  01/23/18 32.47 kg/m  11/02/17 33.96 kg/m  09/18/17 32.99 kg/m     Patient Care Team    Relationship Specialty Notifications Start End  Mellody Dance, DO PCP - General Family Medicine  04/26/16      Patient Active Problem List   Diagnosis Date Noted  . Obesity, Class I, BMI 30-34.9 02/22/2017    Priority: High  . Drug abuse, smokes marijuana qd 02/22/2017    Priority: High    . Chronic heartburn 02/22/2017    Priority: High  . Adjustment disorder with mixed anxiety and depressed mood 07/04/2007    Priority: High  . Exertional epigastric/chest pain 05/03/2017    Priority: Medium  . H/O: substance abuse 05/03/2017    Priority: Medium  . Elevated triglycerides with high cholesterol 03/22/2017    Priority: Medium  . Low serum HDL 03/22/2017    Priority: Medium  . h/o TOBACCO USER 05/10/2009    Priority: Medium  . h/o chronic LOW BACK PAIN 07/10/2008    Priority: Medium  . Nausea and vomiting 04/09/2017    Priority: Low  . Epigastric abdominal pain 02/22/2017    Priority: Low  . 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  . Ocular pain, right eye-  actually periorbital 01/23/2018  . Myalgia 03/22/2017  . h/o Seizures ([place]) 04/26/2016  . History of many Head concussions 04/26/2016  . h/o Chronic neck pain 04/26/2016  . History of shingles 04/26/2016  . Neuralgia, postherpetic- R L Ext 04/26/2016  . Edema of right lower extremity 04/26/2016  . h/o Weakness of left arm 07/14/2013  . h/o Paresthesia of left arm 07/14/2013  . h/o Neck pain on left side 03/17/2013  . h/o Pain in joint, R shoulder region 03/17/2013  . Acute confusional state 12/18/2012  . h/o Migraine 02/15/2009    Past Medical history, Surgical history, Family history, Social history, Allergies and Medications have been entered into the medical record, reviewed and changed as needed.    Current Meds  Medication Sig  . ibuprofen (ADVIL,MOTRIN) 200 MG tablet Take 200 mg by mouth every 6 (six) hours as needed for moderate pain.  [facility] omeprazole (PRILOSEC) 20 MG capsule Take 20 mg by mouth daily.   Current Facility-Administered Medications for the 01/23/18 encounter (Office Visit) with Mellody Dance, DO  Medication  . 0.9 %  sodium chloride infusion    Allergies:  No Known Allergies   Review of Systems: General:   Denies fever, chills, unexplained weight loss.  Optho/Auditory:   Denies visual  changes, blurred vision/LOV Respiratory:   Denies wheeze, DOE more than baseline levels.  Cardiovascular:   Denies chest pain, palpitations, new onset peripheral edema  Gastrointestinal:   Denies nausea, vomiting, diarrhea, abd pain.  Genitourinary: Denies dysuria, freq/ urgency, flank pain or discharge from genitals.  Endocrine:  Denies hot or cold intolerance, polyuria, polydipsia. Musculoskeletal:   Denies unexplained myalgias, joint swelling, unexplained arthralgias, gait problems.  Skin:  Denies new onset rash, suspicious lesions Neurological:     Denies dizziness, unexplained weakness, numbness  Psychiatric/Behavioral:   Denies mood changes, suicidal or homicidal ideations, hallucinations    Objective:   Blood pressure 130/79, pulse 65, height 5' 9.75" (1.772 m), weight 224 lb 11.2 oz (101.9 kg), SpO2 98 %. Body mass index is 32.47 kg/m. General:  Well Developed, well nourished, appropriate for stated age.  Neuro:  Alert and oriented,  extra-ocular muscles intact  HEENT:  Normocephalic, atraumatic, neck supple Skin:  no gross rash, warm, pink. Cardiac:  RRR, S1 S2 Respiratory:  ECTA B/L and A/P, Not using accessory muscles, speaking in full sentences- unlabored. Vascular:  Ext warm, no cyanosis apprec.; cap RF less 2 sec. Psych:  No HI/SI, judgement and insight good, Euthymic mood. Full Affect.

## 2018-01-23 NOTE — Patient Instructions (Addendum)
Please let me know if you change your mind on the MRI of brain and orbits without contrast.  I think this is necessary for complete work-up of this condition however without having insurance I understand limitations. -Also if this is something that does not improve with the treatment we are providing, next step would be to go to a neurologist to be evaluated which would likely require more testing etc.  Please let me know if you would like this referral.     Ocular Migraines Ocular migraines are also referred to as ophthalmic migraines. Ocular Migraines are visual disturbances most commonly noted in the peripheral vision.  What are the symptoms? Visual symptoms (usually in both eyes but often to one side) can consist of the following: - a spot of blurring that expands to one side over 10-30 minutes; - an expanding border often described as zig-zag lines, shimmering or resembling heat waves or sparklers; - vision loss in one eye only, involving the entire field or only the upper or lower section. Rare symptoms include double vision, change in lid position (lid droop), or change in pupil size (both smaller and larger).  What are the causes? While it is not clear exactly how a migraine works, it is believed that the basic cause of migraine is an abnormality in the neurotransmitter serotonin, an important chemical used by your brain cells. During a migraine attack, changes in serotonin affect blood vessels in your brain (and blood vessels to your eye), often causing the vessels to constrict. The ocular migraine can occur either in conjunction with the classic migraine or without the corresponding headache.  Is there anything I can do to prevent ocular migraines? A person can lower the risk for migraines by identifying and avoiding trigger factors which induce the onset of symptoms. The common trigger factors are: 1. Certain foods: Caffeine (coffee, colas, chocolate), citrus fruits,  MSG alcohol, nitrate and nitrites (in cured meats), aged cheese 2. Stress/fatigue: This can be physical (heat/cold) or emotional. 3. Hormonal changes: puberty, pregnancy, menopause, birth control pills 4. Trauma and head injury 5. Bright light 6. Loud noises What is the treatment? In general, there are no serious complications caused by ocular migraine. Usually, ocular migraines do not need treatment unless associated with more severe classic or common migraines symptoms. Migraine Headaches Migraine is a common neurological condition occurring in at least 15-20% of the population and in up to 50% of women. Classic migraine usually starts with visual symptoms (often zig-zag lines, colored lights or flashes of light expanding to one side of your vision over 15-60 minutes), followed by a single sided pounding severe headache. The headache is usually associated with nausea, vomiting, and light sensitivity. Sometimes visual symptoms and even neurologic dysfunction may occur without the headache. This is called migraine variant. Common migraine may cause only a headache felt on both sides of the head. This form of migraine may be responsible for the headaches that many people may have attributed to tension, stress, or sinus pain.

## 2018-07-18 ENCOUNTER — Ambulatory Visit (HOSPITAL_COMMUNITY)
Admission: EM | Admit: 2018-07-18 | Discharge: 2018-07-18 | Disposition: A | Discharge status: Home / Self Care | Payer: Self-pay

## 2018-07-18 ENCOUNTER — Other Ambulatory Visit: Payer: Self-pay

## 2018-07-18 ENCOUNTER — Encounter (HOSPITAL_COMMUNITY): Payer: Self-pay | Admitting: Family Medicine

## 2018-07-18 DIAGNOSIS — B349 Viral infection, unspecified: Secondary | ICD-10-CM

## 2018-07-18 NOTE — ED Provider Notes (Signed)
[place]    CSN: [id number] Arrival date & time: 07/18/18  8891     History   Chief Complaint Chief Complaint  Patient presents with  . Diarrhea    HPI Micheal Roth is a 42 y.o. male.   Patient is a 42 year old male who presents for 3 days of body aches, chills, nausea, vomiting, cough, congestion.  His symptoms have been constant.  The vomiting has improved.  He still is having nonbloody diarrhea.  He denies any associated fever.  Reports a coworker had the flu.  Denies any ear pain, sore throat.  He has not taken anything for his symptoms.  Denies any recent traveling  ROS per HPI        Past Medical History:  Diagnosis Date  . Arthritis   . Chronic back pain   . Chronic left shoulder pain   . Chronic neck pain   . Depression   . GERD (gastroesophageal reflux disease)   . Migraines   . Paresthesia of left arm and leg    and weakness of left arm and leg  . Seizures [facility])     Patient Active Problem List   Diagnosis Date Noted  . 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  . Ocular pain, right eye-  actually periorbital 01/23/2018  . Exertional epigastric/chest pain 05/03/2017  . H/O: substance abuse ([place]) 05/03/2017  . Nausea and vomiting 04/09/2017  . Elevated triglycerides with high cholesterol 03/22/2017  . Low serum HDL 03/22/2017  . Myalgia 03/22/2017  . Obesity, Class I, BMI 30-34.9 02/22/2017  . Drug abuse, smokes marijuana qd 02/22/2017  . Chronic heartburn 02/22/2017  . Epigastric abdominal pain 02/22/2017  . h/o Seizures ([place]) 04/26/2016  . History of many Head concussions 04/26/2016  . h/o Chronic neck pain 04/26/2016  . History of shingles 04/26/2016  . Neuralgia, postherpetic- R L Ext 04/26/2016  . Edema of right lower extremity 04/26/2016  . h/o Weakness of left arm 07/14/2013  . h/o Paresthesia of left arm 07/14/2013  . h/o Neck pain on left side 03/17/2013  . h/o Pain in joint, R shoulder region 03/17/2013  . Acute confusional  state 12/18/2012  . h/o TOBACCO USER 05/10/2009  . h/o Migraine 02/15/2009  . h/o chronic LOW BACK PAIN 07/10/2008  . Adjustment disorder with mixed anxiety and depressed mood 07/04/2007    Past Surgical History:  Procedure Laterality Date  . BACK SURGERY    . KNEE SURGERY    . 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 Authorizing Provider  ibuprofen (ADVIL,MOTRIN) 200 MG tablet Take 200 mg by mouth every 6 (six) hours as needed for moderate pain.    [provider]  omeprazole (PRILOSEC) 20 MG capsule Take 20 mg by mouth daily.    [provider]  SUMAtriptan-naproxen (TREXIMET) 85-500 MG tablet One-half to one tab PO at the first sign of pain, may repeat x1 in 2 hours if pain still present. 01/23/18   Mellody Dance, DO    Family History Family History  Problem Relation Age of Onset  . Diabetes Father   . Diabetes Maternal Grandmother   . Heart disease Maternal Grandmother        CHF  . Heart attack Maternal Grandmother   . Crohn's disease Brother   . Colon cancer Neg Hx   .  Colon polyps Neg Hx   . Esophageal cancer Neg Hx   . Stomach cancer Neg Hx   . Rectal cancer Neg Hx     Social History Social History   Tobacco Use  . Smoking status: Former Smoker    Types: Cigarettes    Last attempt to quit: 09/04/2013    Years since quitting: 4.8  . Smokeless tobacco: Never Used  Substance Use Topics  . Alcohol use: No    Comment: occasional  . Drug use: Yes    Frequency: 2.0 times per week    Types: Marijuana     Allergies   Patient has no known allergies.   Review of Systems Review of Systems   Physical Exam Triage Vital Signs ED Triage Vitals  Enc Vitals Group     BP --      Pulse Rate 07/18/18 0832 83     Resp 07/18/18 0832 18     Temp 07/18/18 0832 97.9 F (36.6 C)     Temp Source 07/18/18  0832 Oral     SpO2 07/18/18 0832 98 %     Weight 07/18/18 0833 235 lb (106.6 kg)     Height --      Head Circumference --      Peak Flow --      Pain Score 07/18/18 0833 7     Pain Loc --      Pain Edu? --      Excl. in [place]? --    No data found.  Updated Vital Signs Pulse 83   Temp 97.9 F (36.6 C) (Oral)   Resp 18   Wt 235 lb (106.6 kg)   SpO2 98%   BMI 33.96 kg/m   Visual Acuity Right Eye Distance:   Left Eye Distance:   Bilateral Distance:    Right Eye Near:   Left Eye Near:    Bilateral Near:     Physical Exam  Constitutional: He appears well-developed and well-nourished.  HENT:  Head: Normocephalic and atraumatic.  Right Ear: External ear normal.  Left Ear: External ear normal.  Mouth/Throat: Oropharynx is clear and moist.  Bilateral TMs normal  Eyes: Conjunctivae are normal.  Neck: Normal range of motion. Neck supple.  Cardiovascular: Normal rate, regular rhythm and normal heart sounds.  Pulmonary/Chest: Effort normal and breath sounds normal. No stridor. No respiratory distress. He has no wheezes. He has no rales. He exhibits no tenderness.  Abdominal: Soft.  Musculoskeletal: Normal range of motion.  Lymphadenopathy:    He has no cervical adenopathy.  Neurological: He is alert.  Skin: Skin is warm and dry.  Psychiatric: He has a normal mood and affect.  Nursing note and vitals reviewed.    UC Treatments / Results  Labs (all labs ordered are listed, but only abnormal results are displayed) Labs Reviewed - No data to display  EKG None  Radiology No results found.  Procedures Procedures (including critical care time)  Medications Ordered in UC Medications - No data to display  Initial Impression / Assessment and Plan / UC Course  I have reviewed the triage vital signs and the nursing notes.  Pertinent labs & imaging results that were available during my care of the patient were reviewed by me and considered in my medical decision making  (see chart for details).     Most likely viral illness Will treat symptoms with over-the-counter Mucinex for cough and congestion Ibuprofen for body aches Follow up as needed for continued or  worsening symptoms  Final Clinical Impressions(s) / UC Diagnoses   Final diagnoses:  Viral illness     Discharge Instructions     This is most likely a viral illness Mucinex can help with the cough and congestion Ibuprofen for body aches Make sure you are staying hydrated Follow up as needed for continued or worsening symptoms     ED Prescriptions    None     Controlled Substance Prescriptions [place] Controlled Substance Registry consulted? Not Applicable   Orvan July, NP 07/18/18 [phone]

## 2018-07-18 NOTE — Discharge Instructions (Signed)
This is most likely a viral illness Mucinex can help with the cough and congestion Ibuprofen for body aches Make sure you are staying hydrated Follow up as needed for continued or worsening symptoms

## 2018-07-18 NOTE — ED Triage Notes (Signed)
Pt cc he has diarrhea and on going cold symptoms cough , congestion, and body aches , chilly x 3 days

## 2018-08-17 IMAGING — DX DG TIBIA/FIBULA 2V*R*
2 series · 2 of 2 positions shown · non-contrast
Comparison: 01/24/2013

CLINICAL DATA: Right leg and foot swelling for 1 month, no known
injury

EXAM:
RIGHT TIBIA AND FIBULA - 2 VIEW

[tibia ap]
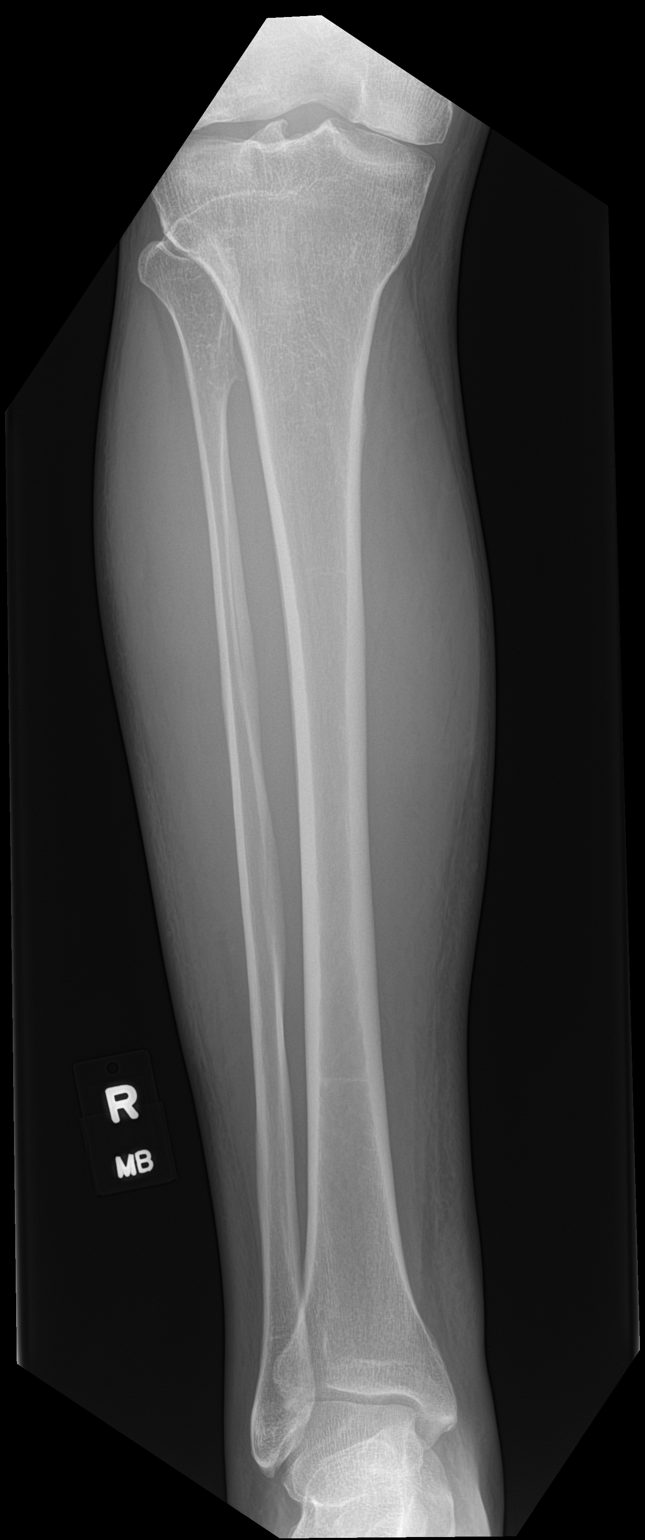

[tibia lat]
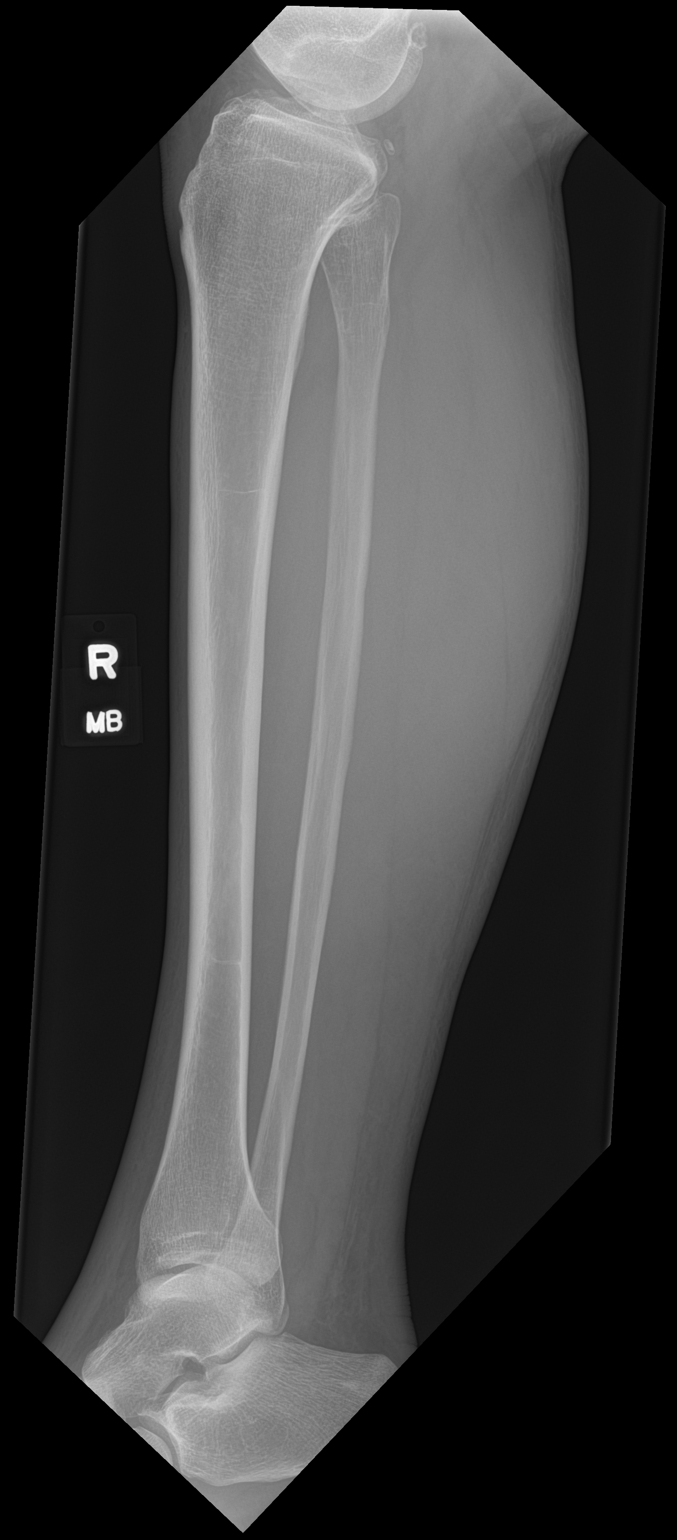

[2 of 2 positions shown; findings below may reference images not displayed]

FINDINGS: Two views of the right tibia-fibula submitted. No acute fracture or
subluxation. No radiopaque foreign body.
IMPRESSION: Negative.

## 2018-08-18 ENCOUNTER — Ambulatory Visit (HOSPITAL_COMMUNITY)
Admission: EM | Admit: 2018-08-18 | Discharge: 2018-08-18 | Disposition: A | Discharge status: Home / Self Care | Payer: Self-pay | Attending: Family | Admitting: Family

## 2018-08-18 ENCOUNTER — Other Ambulatory Visit: Payer: Self-pay

## 2018-08-18 ENCOUNTER — Encounter (HOSPITAL_COMMUNITY): Payer: Self-pay | Admitting: *Deleted

## 2018-08-18 DIAGNOSIS — R059 Cough, unspecified: Secondary | ICD-10-CM

## 2018-08-18 DIAGNOSIS — J0110 Acute frontal sinusitis, unspecified: Secondary | ICD-10-CM

## 2018-08-18 DIAGNOSIS — R05 Cough: Secondary | ICD-10-CM

## 2018-08-18 MED ORDER — AMOXICILLIN-POT CLAVULANATE 875-125 MG PO TABS: 1.0000 | ORAL_TABLET | Freq: Two times a day (BID) | ORAL | 0 refills | Status: AC

## 2018-08-18 MED ORDER — BENZONATATE 100 MG PO CAPS: 100.0000 mg | ORAL_CAPSULE | Freq: Three times a day (TID) | ORAL | 0 refills | Status: DC | PRN

## 2018-08-18 MED ORDER — ALBUTEROL SULFATE HFA 108 (90 BASE) MCG/ACT IN AERS: 2.0000 | INHALATION_SPRAY | Freq: Four times a day (QID) | RESPIRATORY_TRACT | 0 refills | Status: DC | PRN

## 2018-08-18 NOTE — ED Provider Notes (Signed)
[place]    CSN: [id number] Arrival date & time: 08/18/18  1225     History   Chief Complaint Chief Complaint  Patient presents with  . Appointment    1245  . Cough  . Shortness of Breath    HPI Micheal Roth is a 42 y.o. male.   42 year old male presents with nasal congestion, cough, chest congestion for over 1 week. Getting worse with headache, dizziness, and occasionally coughing up dark green mucus. Denies any fever or GI symptoms. Has taken Mucinex, Robitussin and Ibuprofen with minimal relief. Was seen about 1 month ago for cough and flu-symptoms. Completely resolved after 1 week and then these symptoms started about 1 week later. Other chronic health issues include GERD and takes OTC Prilosec daily.   The history is provided by the patient and the spouse.    Past Medical History:  Diagnosis Date  . Arthritis   . Chronic back pain   . Chronic left shoulder pain   . Chronic neck pain   . Depression   . GERD (gastroesophageal reflux disease)   . Migraines   . Paresthesia of left arm and leg    and weakness of left arm and leg  . Seizures [facility])     Patient Active Problem List   Diagnosis Date Noted  . 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  . Ocular pain, right eye-  actually periorbital 01/23/2018  . Exertional epigastric/chest pain 05/03/2017  . H/O: substance abuse ([place]) 05/03/2017  . Nausea and vomiting 04/09/2017  . Elevated triglycerides with high cholesterol 03/22/2017  . Low serum HDL 03/22/2017  . Myalgia 03/22/2017  . Obesity, Class I, BMI 30-34.9 02/22/2017  . Drug abuse, smokes marijuana qd 02/22/2017  . Chronic heartburn 02/22/2017  . Epigastric abdominal pain 02/22/2017  . h/o Seizures ([place]) 04/26/2016  . History of many Head concussions 04/26/2016  . h/o Chronic neck pain 04/26/2016  . History of shingles 04/26/2016  . Neuralgia, postherpetic- R L Ext 04/26/2016  . Edema of right lower extremity 04/26/2016  . h/o Weakness of left  arm 07/14/2013  . h/o Paresthesia of left arm 07/14/2013  . h/o Neck pain on left side 03/17/2013  . h/o Pain in joint, R shoulder region 03/17/2013  . Acute confusional state 12/18/2012  . h/o TOBACCO USER 05/10/2009  . h/o Migraine 02/15/2009  . h/o chronic LOW BACK PAIN 07/10/2008  . Adjustment disorder with mixed anxiety and depressed mood 07/04/2007    Past Surgical History:  Procedure Laterality Date  . BACK SURGERY    . KNEE SURGERY    . 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 Authorizing Provider  omeprazole (PRILOSEC) 20 MG capsule Take 20 mg by mouth daily.   Yes [provider]  albuterol (PROVENTIL HFA;VENTOLIN HFA) 108 (90 Base) MCG/ACT inhaler Inhale 2 puffs into the lungs every 6 (six) hours as needed for wheezing or shortness of breath. 08/18/18   Katy Apo, NP  amoxicillin-clavulanate (AUGMENTIN) 875-125 MG tablet Take 1 tablet by mouth every 12 (twelve) hours for 7 days. 08/18/18 08/25/18  Katy Apo, NP  benzonatate (TESSALON) 100 MG capsule Take 1 capsule (100 mg total) by mouth 3 (three) times daily as needed for cough. 08/18/18   Katy Apo, NP    Family History  Family History  Problem Relation Age of Onset  . Diabetes Father   . Diabetes Maternal Grandmother   . Heart disease Maternal Grandmother        CHF  . Heart attack Maternal Grandmother   . Crohn's disease Brother   . Colon cancer Neg Hx   . Colon polyps Neg Hx   . Esophageal cancer Neg Hx   . Stomach cancer Neg Hx   . Rectal cancer Neg Hx     Social History Social History   Tobacco Use  . Smoking status: Former Smoker    Types: Cigarettes    Last attempt to quit: 09/04/2013    Years since quitting: 4.9  . Smokeless tobacco: Never Used  Substance Use Topics  . Alcohol use: No    Comment: rare  . Drug use: Yes     Frequency: 2.0 times per week    Types: Marijuana     Allergies   Patient has no known allergies.   Review of Systems Review of Systems  Constitutional: Positive for chills and fatigue. Negative for appetite change and fever.  HENT: Positive for congestion, postnasal drip, rhinorrhea, sinus pressure, sinus pain and sore throat. Negative for ear discharge, ear pain, facial swelling, mouth sores, nosebleeds, sneezing and trouble swallowing.   Eyes: Negative for pain, discharge, redness and itching.  Respiratory: Positive for cough, chest tightness, shortness of breath and wheezing.   Cardiovascular: Negative for chest pain and palpitations.  Gastrointestinal: Negative for abdominal pain, nausea and vomiting.  Musculoskeletal: Negative for arthralgias, myalgias, neck pain and neck stiffness.  Skin: Negative for color change, rash and wound.  Neurological: Positive for dizziness, light-headedness and headaches. Negative for tremors, seizures, syncope, weakness and numbness.  Hematological: Negative for adenopathy. Does not bruise/bleed easily.  Psychiatric/Behavioral: Negative.      Physical Exam Triage Vital Signs ED Triage Vitals  Enc Vitals Group     BP 08/18/18 1231 133/74     Pulse Rate 08/18/18 1231 64     Resp 08/18/18 1231 18     Temp 08/18/18 1231 98.2 F (36.8 C)     Temp src --      SpO2 08/18/18 1231 99 %     Weight --      Height --      Head Circumference --      Peak Flow --      Pain Score 08/18/18 1233 2     Pain Loc --      Pain Edu? --      Excl. in [place]? --    No data found.  Updated Vital Signs BP 133/74   Pulse 64   Temp 98.2 F (36.8 C)   Resp 18   SpO2 99%   Visual Acuity Right Eye Distance:   Left Eye Distance:   Bilateral Distance:    Right Eye Near:   Left Eye Near:    Bilateral Near:     Physical Exam Vitals signs and nursing note reviewed.  Constitutional:      General: He is not in acute distress.    Appearance: Normal  appearance. He is well-developed and overweight. He is ill-appearing.  HENT:     Head: Normocephalic and atraumatic.     Right Ear: Hearing, tympanic membrane, ear canal and external ear normal.     Left Ear: Hearing, tympanic membrane, ear canal and external ear normal.     Nose: Mucosal edema and congestion present.     Right  Sinus: Maxillary sinus tenderness and frontal sinus tenderness present.     Left Sinus: Maxillary sinus tenderness and frontal sinus tenderness present.     Mouth/Throat:     Mouth: Mucous membranes are moist.     Pharynx: Oropharyngeal exudate (thick yellow post nasal drainage present) and posterior oropharyngeal erythema present.  Eyes:     Extraocular Movements: Extraocular movements intact.     Conjunctiva/sclera: Conjunctivae normal.  Neck:     Musculoskeletal: Normal range of motion and neck supple.  Cardiovascular:     Rate and Rhythm: Normal rate and regular rhythm.     Heart sounds: Normal heart sounds. No murmur.  Pulmonary:     Effort: Pulmonary effort is normal. No respiratory distress.     Breath sounds: Examination of the right-upper field reveals decreased breath sounds and wheezing. Examination of the left-upper field reveals decreased breath sounds and wheezing. Examination of the right-lower field reveals decreased breath sounds. Examination of the left-lower field reveals decreased breath sounds. Decreased breath sounds and wheezing present. No rhonchi or rales.  Musculoskeletal: Normal range of motion.  Lymphadenopathy:     Cervical: No cervical adenopathy.  Skin:    General: Skin is warm and dry.     Capillary Refill: Capillary refill takes less than 2 seconds.     Findings: No rash.  Neurological:     General: No focal deficit present.     Mental Status: He is alert and oriented to person, place, and time.  Psychiatric:        Mood and Affect: Mood normal.        Behavior: Behavior normal. Behavior is cooperative.        Thought  Content: Thought content normal.        Judgment: Judgment normal.      UC Treatments / Results  Labs (all labs ordered are listed, but only abnormal results are displayed) Labs Reviewed - No data to display  EKG None  Radiology No results found.  Procedures Procedures (including critical care time)  Medications Ordered in UC Medications - No data to display  Initial Impression / Assessment and Plan / UC Course  I have reviewed the triage vital signs and the nursing notes.  Pertinent labs & imaging results that were available during my care of the patient were reviewed by me and considered in my medical decision making (see chart for details).    Discussed with patient that he probably has a sinus infection and drainage causing the cough. Will start Augmentin 875mg  twice a day as directed. Take Tessalon cough pills 1 every 8 hours as needed. Use Albuterol inhaler 2 puffs every 6 hours as needed for cough and wheezing. Increase fluids to help loosen up mucus. Note written for work. Follow-up in 4 to 5 days if not improving.  Final Clinical Impressions(s) / UC Diagnoses   Final diagnoses:  Acute non-recurrent frontal sinusitis  Cough     Discharge Instructions     Recommend start Augmentin 875mg  twice a day as directed. May take Tessalon cough pills 1 every 8 hours as needed. Increase fluid intake to help loosen up mucus. May use Albuterol inhaler 2 puffs every 6 hours as needed for cough or wheezing. Follow-up in 4 to 5 days if not improving.     ED Prescriptions    Medication Sig Dispense Auth. Provider   amoxicillin-clavulanate (AUGMENTIN) 875-125 MG tablet Take 1 tablet by mouth every 12 (twelve) hours for 7 days. 14 tablet Katesha Eichel,  Nicholes Stairs, NP   benzonatate (TESSALON) 100 MG capsule Take 1 capsule (100 mg total) by mouth 3 (three) times daily as needed for cough. 21 capsule Katy Apo, NP   albuterol (PROVENTIL HFA;VENTOLIN HFA) 108 (90 Base) MCG/ACT inhaler  Inhale 2 puffs into the lungs every 6 (six) hours as needed for wheezing or shortness of breath. 1 Inhaler Deoni Cosey, Nicholes Stairs, NP     Controlled Substance Prescriptions [place] Controlled Substance Registry consulted? Not Applicable   Katy Apo, NP 08/19/18 1250

## 2018-08-18 NOTE — Discharge Instructions (Addendum)
Recommend start Augmentin 875mg  twice a day as directed. May take Tessalon cough pills 1 every 8 hours as needed. Increase fluid intake to help loosen up mucus. May use Albuterol inhaler 2 puffs every 6 hours as needed for cough or wheezing. Follow-up in 4 to 5 days if not improving.

## 2018-08-18 NOTE — ED Triage Notes (Signed)
Reports starting with mild cough and congestion approx 1 wk ago with progressive worsening.  C/O SOB with exertion.  Denies any known fevers.

## 2018-10-05 IMAGING — MR MR LUMBAR SPINE W/O CM
4 of 5 series · 19 of 48 positions shown · non-contrast
Comparison: 12/21/2014

CLINICAL DATA: Low back pain with numbness in the left leg,
symptoms started on [REDACTED].

EXAM:
MRI LUMBAR SPINE WITHOUT CONTRAST
TECHNIQUE: Multiplanar, multisequence MR imaging of the lumbar spine was
performed. No intravenous contrast was administered.

[Series 4: T1 · sagittal · 4.0mm · 0.51mm/px · 3 of 14 slices shown (1 of 2)]
[im 3/14]
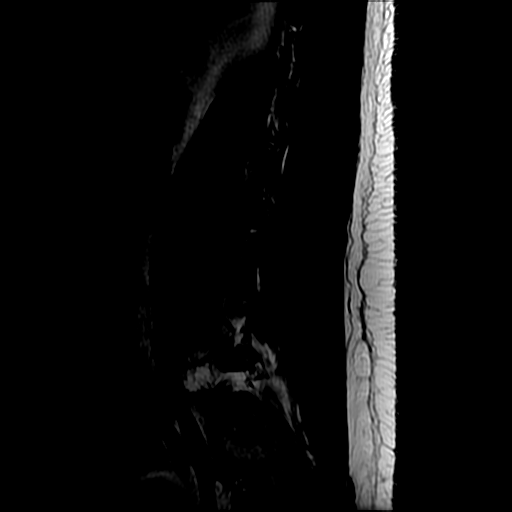
[im 8/14]
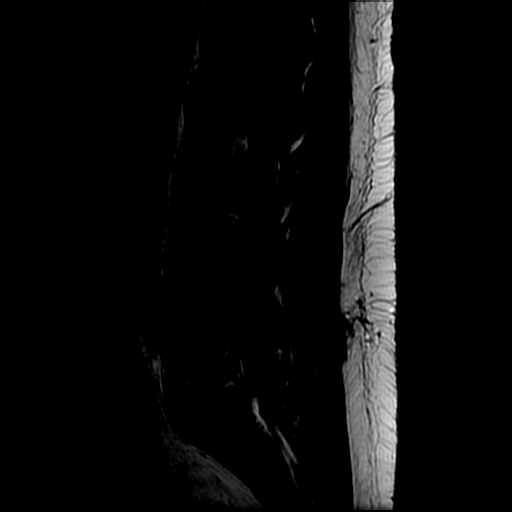
[im 14/14]
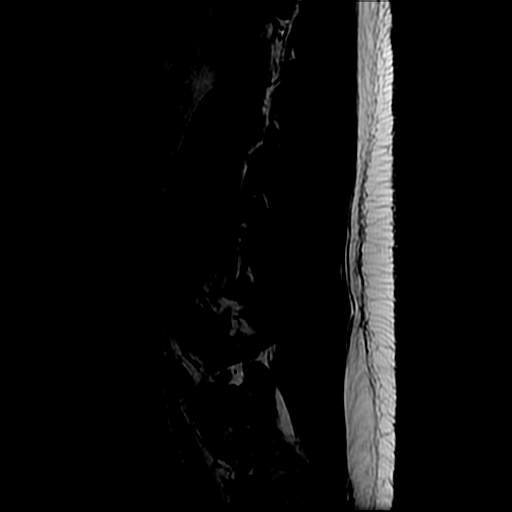

[Series 5: T2 post-contrast · sagittal · 4.0mm · 0.51mm/px · 6 of 14 slices shown]
[im 1/14]
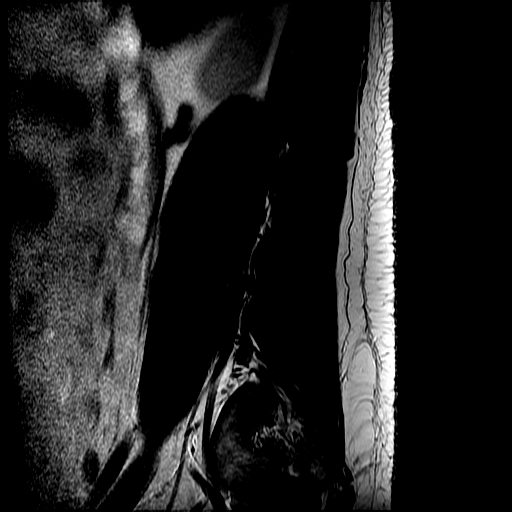
[im 3/14]
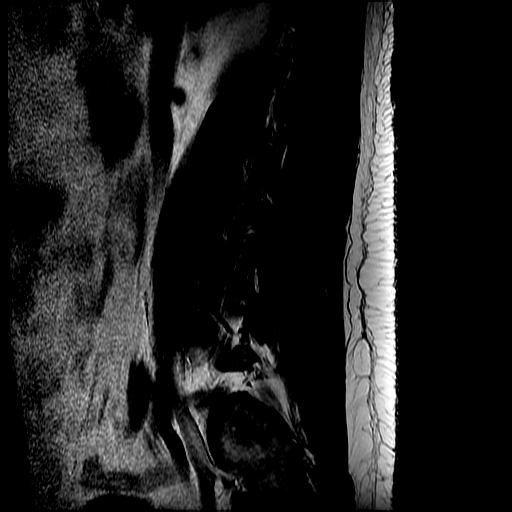
[im 6/14]
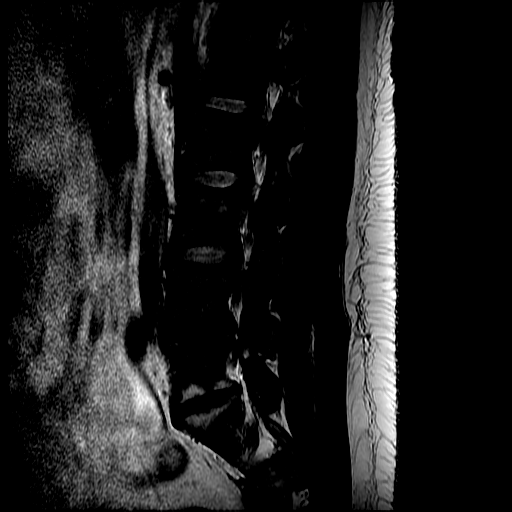
[im 8/14]
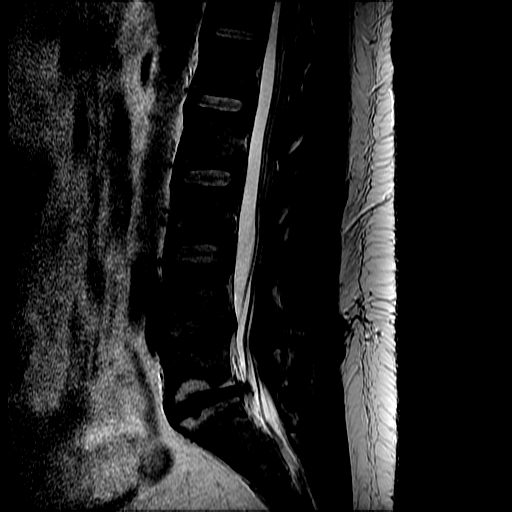
[im 11/14]
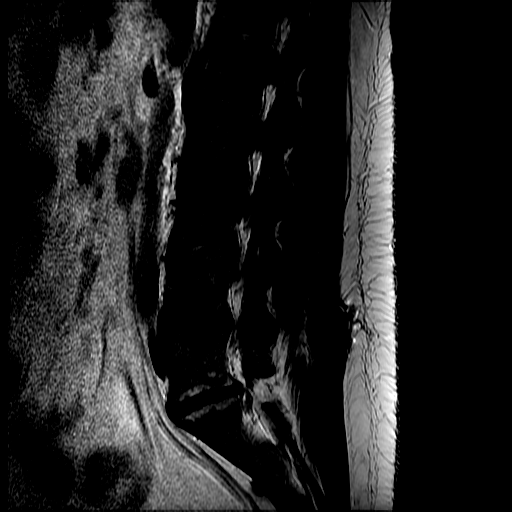
[im 14/14]
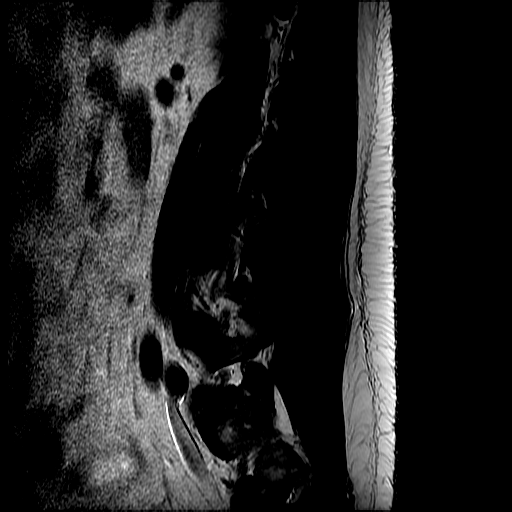

[Series 7: T2 · axial · 4.0mm · 0.39mm/px · z∈[-192,-20]mm · 7 of 38 slices shown]
[im 1/38]
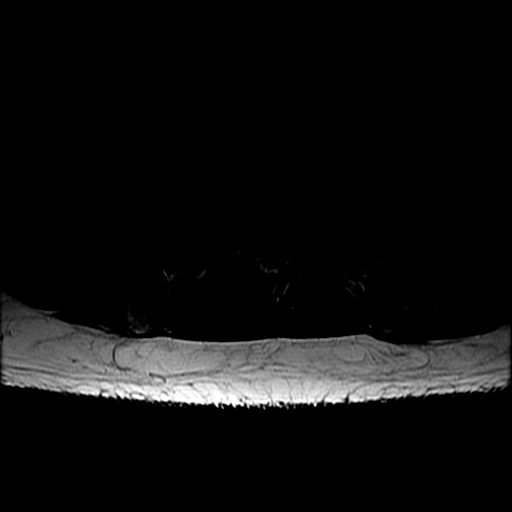
[im 6/38]
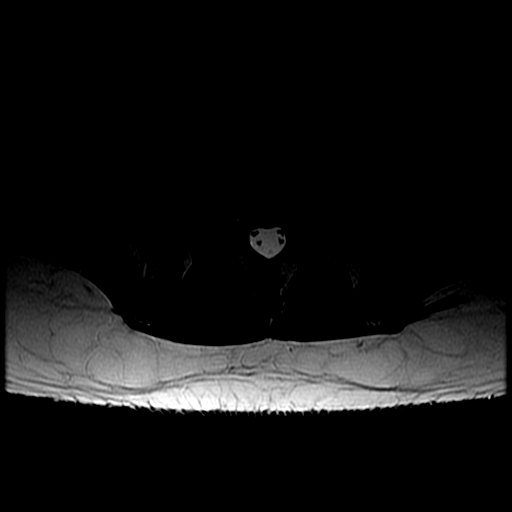
[im 11/38]
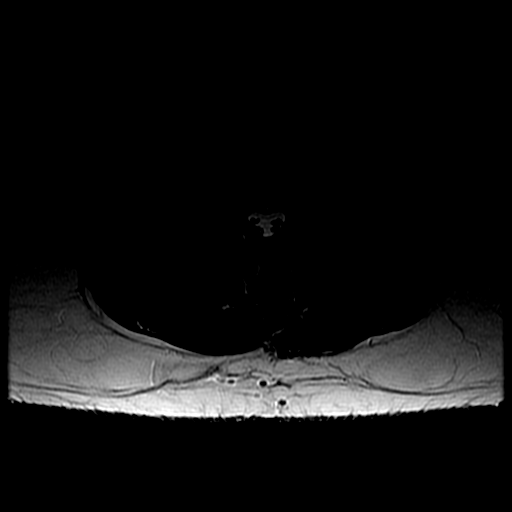
[im 16/38]
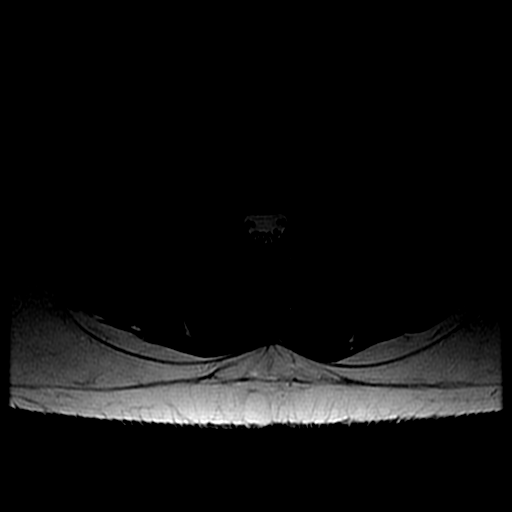
[im 19/38]
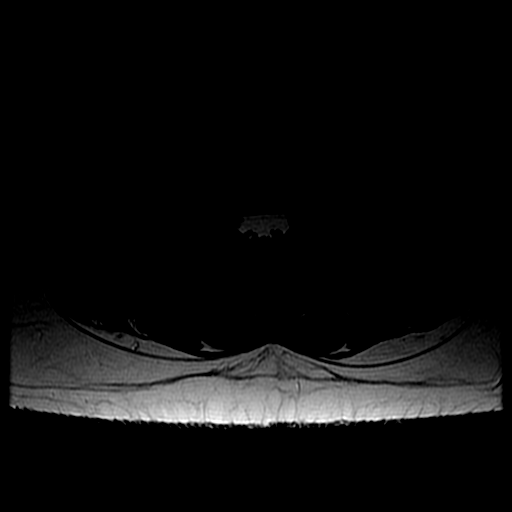
[im 22/38]
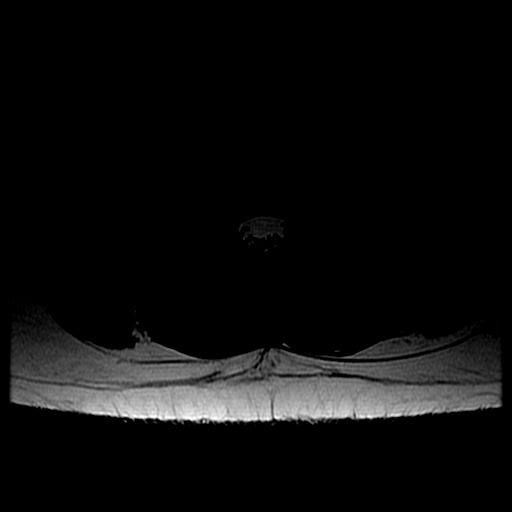
[im 32/38]
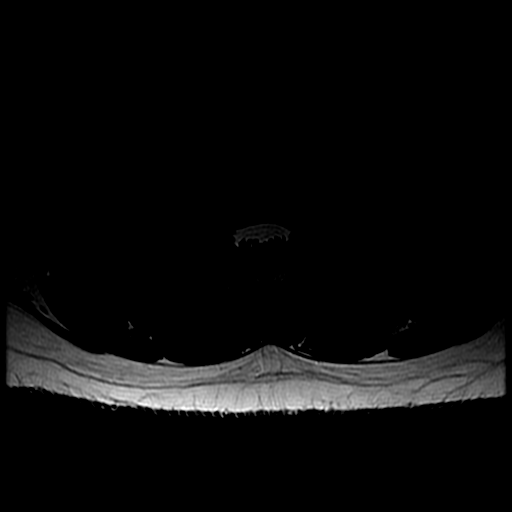

[Series 8: T1 · axial · 4.0mm · 0.39mm/px · z∈[-167,-20]mm · 3 of 38 slices shown (2 of 2)]
[im 6/38]
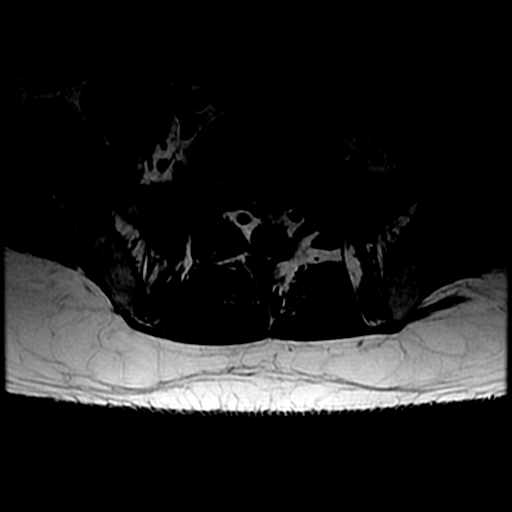
[im 19/38]
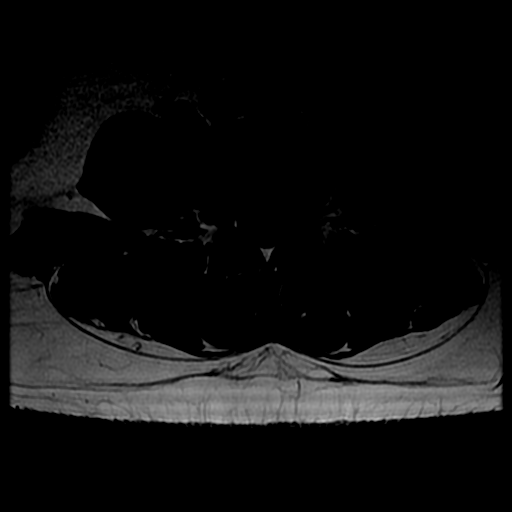
[im 32/38]
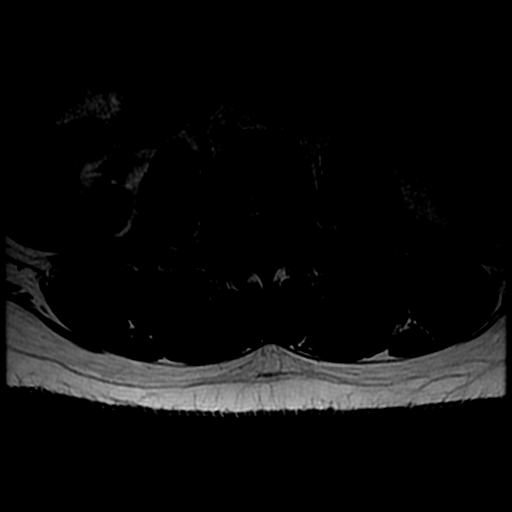

[19 of 48 positions shown; findings below may reference images not displayed]

FINDINGS: Segmentation: The lowest lumbar type non-rib-bearing vertebra is
labeled as L5.

Alignment:  No vertebral subluxation is observed.

Vertebrae: Type 2 degenerative endplate findings at L5-S1 with loss
of disc height and disc desiccation at this level. Mild disc
desiccation at L4-5.

Conus medullaris: Extends to the L1 level and appears normal.

Paraspinal and other soft tissues: Unremarkable

Disc levels:

L1-2: Unremarkable.

L2-3:  Unremarkable.

L3-4:  Unremarkable

L4-5: Mild bilateral subarticular lateral recess stenosis with
borderline right foraminal stenosis and borderline central narrowing
of the thecal sac due to central disc protrusion and disc bulge.

L5-S1: Mild left subarticular lateral recess stenosis and mild
central narrowing of the thecal sac due to a central disc extrusion
which extends cephalad and abuts the left S1 nerve in the lateral
recess. There is some epidural fibrosis in the left lateral recess
with prior left laminectomy. Underlying disc bulge noted.
IMPRESSION: 1. Degenerative disc disease resulting in mild impingement at the
L4-5 and L5-S1 levels as detailed above.

## 2019-07-21 ENCOUNTER — Telehealth: Payer: Self-pay | Admitting: *Deleted

## 2019-07-21 DIAGNOSIS — Z20822 Contact with and (suspected) exposure to covid-19: Secondary | ICD-10-CM

## 2019-07-21 NOTE — Telephone Encounter (Signed)
Noted agree with plan of care 

## 2019-07-21 NOTE — Telephone Encounter (Signed)
RN was alerted by Production designer, theatre/television/film of pt going home due to illness today. RN called pt and spoke with him over phone. He reports GI sx of n/v/d that started acutely after arriving to work this morning around [place]. He felt fine prior to work then began having abd cramping and now with 3 episodes of loose, watery stools and 1 episode of vomiting. He has been able to keep down V-8 juice and Advil since being at home. Denies any URI sx, loss of taste/smell, cough, ShOB, etc. Denies any known sick contacts. Live-in partner also works at [employer], 2nd shift. Plan to have both employees quarantine at this time. Will plan to call pt tomorrow morning to re-evaluate sx and likely continue 10 day symptomatic quarantine and plan testing date. Reviewed liquid diet and advancement to [employer] as tolerated. Recommended otc Imodium if diarrhea continues, and Pepto-Bismal if n/v continues or recurs. Pt verbalizes understanding and agreement with plan of care. No further questions.

## 2019-07-22 NOTE — Telephone Encounter (Signed)
Noted agree with plan of care 

## 2019-07-22 NOTE — Telephone Encounter (Signed)
Spoke with pt over phone. He reports additional episodes of diarrhea yesterday but resolved overnight. Denies n/v. Abd cramping has decreased. Advised to continue 10 day symptomatic quarantine. Plan for covid testing on Monday 11/23, Day 8 of sx. Return to work 11/26 (Thanksgiving, company closed), so 08/01/19 if sx improving and no fever within 24hrs without antipyretics. Drive thru testing instructions given for [employer]. EPSL letter and testing order entered. Pt to f/u with clinic if sx worsen, or with any questions/concerns.

## 2019-07-28 ENCOUNTER — Other Ambulatory Visit: Payer: Self-pay

## 2019-07-28 DIAGNOSIS — Z20822 Contact with and (suspected) exposure to covid-19: Secondary | ICD-10-CM

## 2019-07-30 LAB — NOVEL CORONAVIRUS, NAA: SARS-CoV-2, NAA: NOT DETECTED

## 2019-07-31 ENCOUNTER — Encounter: Payer: Self-pay | Admitting: *Deleted

## 2019-07-31 NOTE — Telephone Encounter (Signed)
Covid PCR Test negative continue plan of care as previously discussed.  [employer] HR Rep Tim notified.  My chart message sent to patient.

## 2019-08-07 NOTE — Telephone Encounter (Signed)
Pt returned to work on-site on 08/01/19 as planned after completing quarantine. Encounter closed.

## 2020-05-28 ENCOUNTER — Encounter (HOSPITAL_COMMUNITY): Payer: Self-pay | Admitting: Emergency Medicine

## 2020-05-28 ENCOUNTER — Emergency Department (HOSPITAL_COMMUNITY)
Admission: EM | Admit: 2020-05-28 | Discharge: 2020-05-28 | Disposition: A | Discharge status: Home / Self Care | Payer: 59 | Attending: Emergency Medicine | Admitting: Emergency Medicine

## 2020-05-28 ENCOUNTER — Other Ambulatory Visit: Payer: Self-pay

## 2020-05-28 DIAGNOSIS — L720 Epidermal cyst: Secondary | ICD-10-CM | POA: Insufficient documentation

## 2020-05-28 DIAGNOSIS — Z87891 Personal history of nicotine dependence: Secondary | ICD-10-CM | POA: Insufficient documentation

## 2020-05-28 DIAGNOSIS — L03211 Cellulitis of face: Secondary | ICD-10-CM | POA: Insufficient documentation

## 2020-05-28 DIAGNOSIS — L03221 Cellulitis of neck: Secondary | ICD-10-CM

## 2020-05-28 DIAGNOSIS — L723 Sebaceous cyst: Secondary | ICD-10-CM

## 2020-05-28 MED ORDER — DOXYCYCLINE HYCLATE 100 MG PO CAPS
100.0000 mg | ORAL_CAPSULE | Freq: Two times a day (BID) | ORAL | 0 refills | Status: DC
Start: 2020-05-28 — End: 2020-11-02

## 2020-05-28 NOTE — ED Triage Notes (Signed)
Pt reports abscess/cyst behind left ear that he states has been there for some time, reports pcp has looked at it but wanted to leave it alone, area became larger and painful yesterday. Denies drainage.

## 2020-05-28 NOTE — ED Provider Notes (Signed)
[place] EMERGENCY DEPARTMENT Provider Note   CSN: [id number] Arrival date & time: 05/28/20  0715     History Chief Complaint  Patient presents with  . Abscess    Micheal Roth is a 44 y.o. male.  The history is provided by the patient.  Abscess Location:  Head/neck Head/neck abscess location:  L neck Size:  About the size of a golf ball Abscess quality: painful and redness   Duration: The lump has been there for several years and has slowly been growing over the last 1 year but in the last 4 to 5 days it has become red and painful. Progression:  Worsening Pain details:    Quality:  Aching, tightness and throbbing   Severity:  Moderate   Duration:  5 days   Timing:  Constant   Progression:  Worsening Chronicity:  New Relieved by:  None tried Exacerbated by: Palpation. Ineffective treatments:  None tried Associated symptoms: no fever   Risk factors: no prior abscess        Past Medical History:  Diagnosis Date  . Arthritis   . Chronic back pain   . Chronic left shoulder pain   . Chronic neck pain   . Depression   . GERD (gastroesophageal reflux disease)   . Migraines   . Paresthesia of left arm and leg    and weakness of left arm and leg  . Seizures [facility])     Patient Active Problem List   Diagnosis Date Noted  . 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  . Ocular pain, right eye-  actually periorbital 01/23/2018  . Exertional epigastric/chest pain 05/03/2017  . H/O: substance abuse ([place]) 05/03/2017  . Nausea and vomiting 04/09/2017  . Elevated triglycerides with high cholesterol 03/22/2017  . Low serum HDL 03/22/2017  . Myalgia 03/22/2017  . Obesity, Class I, BMI 30-34.9 02/22/2017  . Drug abuse, smokes marijuana qd 02/22/2017  . Chronic heartburn 02/22/2017  . Epigastric abdominal pain 02/22/2017  . h/o Seizures ([place]) 04/26/2016  . History of many Head concussions 04/26/2016  . h/o Chronic neck pain 04/26/2016  . History of shingles  04/26/2016  . Neuralgia, postherpetic- R L Ext 04/26/2016  . Edema of right lower extremity 04/26/2016  . h/o Weakness of left arm 07/14/2013  . h/o Paresthesia of left arm 07/14/2013  . h/o Neck pain on left side 03/17/2013  . h/o Pain in joint, R shoulder region 03/17/2013  . Acute confusional state 12/18/2012  . h/o TOBACCO USER 05/10/2009  . h/o Migraine 02/15/2009  . h/o chronic LOW BACK PAIN 07/10/2008  . Adjustment disorder with mixed anxiety and depressed mood 07/04/2007    Past Surgical History:  Procedure Laterality Date  . BACK SURGERY    . KNEE SURGERY    . 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 Diabetes Father   . Diabetes Maternal Grandmother   . Heart disease Maternal Grandmother        CHF  . Heart attack Maternal Grandmother   . Crohn's disease Brother   . Colon cancer Neg Hx   . Colon polyps Neg Hx   . Esophageal cancer Neg Hx   . Stomach cancer Neg Hx   . Rectal cancer Neg Hx     Social History   Tobacco Use  . Smoking status: Former Smoker    Types: Cigarettes  Quit date: 09/04/2013    Years since quitting: 6.7  . Smokeless tobacco: Never Used  Vaping Use  . Vaping Use: Never used  Substance Use Topics  . Alcohol use: No    Comment: rare  . Drug use: Yes    Frequency: 2.0 times per week    Types: Marijuana    Home Medications Prior to Admission medications   Medication Sig Start Date End Date Taking? Authorizing Provider  albuterol (PROVENTIL HFA;VENTOLIN HFA) 108 (90 Base) MCG/ACT inhaler Inhale 2 puffs into the lungs every 6 (six) hours as needed for wheezing or shortness of breath. 08/18/18   Katy Apo, NP  benzonatate (TESSALON) 100 MG capsule Take 1 capsule (100 mg total) by mouth 3 (three) times daily as needed for cough. 08/18/18   Katy Apo, NP  doxycycline (VIBRAMYCIN) 100 MG capsule Take 1 capsule (100 mg total) by  mouth 2 (two) times daily. 05/28/20   Blanchie Dessert, MD  omeprazole (PRILOSEC) 20 MG capsule Take 20 mg by mouth daily.    [provider]    Allergies    Patient has no known allergies.  Review of Systems   Review of Systems  Constitutional: Negative for fever.  All other systems reviewed and are negative.   Physical Exam Updated Vital Signs BP (!) 142/85 (BP Location: Right Arm)   Pulse 80   Temp 98.1 F (36.7 C) (Oral)   Resp 17   Ht 6' (1.829 m)   Wt 95.3 kg   SpO2 100%   BMI 28.48 kg/m   Physical Exam Vitals and nursing note reviewed.  Constitutional:      General: He is not in acute distress.    Appearance: Normal appearance. He is obese.  HENT:     Head: Normocephalic.      Right Ear: Tympanic membrane normal.     Left Ear: Tympanic membrane normal.     Nose: Nose normal.     Mouth/Throat:     Mouth: Mucous membranes are moist.  Cardiovascular:     Rate and Rhythm: Normal rate.  Pulmonary:     Effort: Pulmonary effort is normal.  Skin:    Capillary Refill: Capillary refill takes less than 2 seconds.  Neurological:     General: No focal deficit present.     Mental Status: He is alert and oriented to person, place, and time. Mental status is at baseline.  Psychiatric:        Mood and Affect: Mood normal.        Behavior: Behavior normal.     ED Results / Procedures / Treatments   Labs (all labs ordered are listed, but only abnormal results are displayed) Labs Reviewed - No data to display  EKG None  Radiology No results found.  Procedures Procedures (including critical care time)  Medications Ordered in ED Medications - No data to display  ED Course  I have reviewed the triage vital signs and the nursing notes.  Pertinent labs & imaging results that were available during my care of the patient were reviewed by me and considered in my medical decision making (see chart for details).    MDM Rules/Calculators/A&P                           Patient presenting today with what appears to be an inflamed cyst under his left ear.  He reports it has become bigger in size in the last  week and become tender.  Bedside ultrasound shows a cystic lesion but no fluid-filled pocket.  He has no evidence of pointing or fluctuance of the area.  There is some mild erythema and warmth.  It does not track down into the neck he has no issues with hearing or movement of his jaw.  Swallowing is normal.  Suspect this is an inflamed epidermoid cyst but will need surgical removal.  No indication for drainage today.  Patient started on doxycycline and given follow-up with general surgery.  Final Clinical Impression(s) / ED Diagnoses Final diagnoses:  Inflamed epidermoid cyst of skin  Cellulitis of neck    Rx / DC Orders ED Discharge Orders         Ordered    doxycycline (VIBRAMYCIN) 100 MG capsule  2 times daily        05/28/20 0816           Blanchie Dessert, MD 05/28/20 [phone]

## 2020-05-28 NOTE — ED Notes (Signed)
Provider at bedside

## 2020-05-28 NOTE — Discharge Instructions (Signed)
Use warm compresses and take the antibiotic.  If you start having fever, drainage or other concerns return to the ER.

## 2020-06-02 ENCOUNTER — Encounter (HOSPITAL_COMMUNITY): Payer: Self-pay

## 2020-06-02 ENCOUNTER — Emergency Department (HOSPITAL_COMMUNITY)
Admission: EM | Admit: 2020-06-02 | Discharge: 2020-06-02 | Disposition: A | Discharge status: Home / Self Care | Payer: 59 | Attending: Emergency Medicine | Admitting: Emergency Medicine

## 2020-06-02 ENCOUNTER — Emergency Department (HOSPITAL_COMMUNITY): Payer: 59

## 2020-06-02 ENCOUNTER — Other Ambulatory Visit: Payer: Self-pay

## 2020-06-02 DIAGNOSIS — Z87891 Personal history of nicotine dependence: Secondary | ICD-10-CM | POA: Diagnosis not present

## 2020-06-02 DIAGNOSIS — L0211 Cutaneous abscess of neck: Secondary | ICD-10-CM | POA: Diagnosis present

## 2020-06-02 DIAGNOSIS — L723 Sebaceous cyst: Secondary | ICD-10-CM | POA: Diagnosis not present

## 2020-06-02 LAB — BASIC METABOLIC PANEL
Anion gap: 6 (ref 5–15)
BUN: 10 mg/dL (ref 6–20)
CO2: 27 mmol/L (ref 22–32)
Calcium: 8.8 mg/dL — ABNORMAL LOW (ref 8.9–10.3)
Chloride: 105 mmol/L (ref 98–111)
Creatinine, Ser: 0.85 mg/dL (ref 0.61–1.24)
GFR calc Af Amer: >60 mL/min (ref >60)
GFR calc non Af Amer: >60 mL/min (ref >60)
Glucose, Bld: 111 mg/dL — ABNORMAL HIGH (ref 70–99)
Potassium: 3.9 mmol/L (ref 3.5–5.1)
Sodium: 138 mmol/L (ref 135–145)

## 2020-06-02 LAB — CBC WITH DIFFERENTIAL/PLATELET
Abs Immature Granulocytes: 0.05 10*3/uL (ref 0.00–0.07)
Basophils Absolute: 0 10*3/uL (ref 0.0–0.1)
Basophils Relative: 0 %
Eosinophils Absolute: 0.3 10*3/uL (ref 0.0–0.5)
Eosinophils Relative: 4 %
HCT: 45.7 % (ref 39.0–52.0)
Hemoglobin: 15.5 g/dL (ref 13.0–17.0)
Immature Granulocytes: 1 %
Lymphocytes Relative: 30 %
Lymphs Abs: 2.2 10*3/uL (ref 0.7–4.0)
MCH: 30.2 pg (ref 26.0–34.0)
MCHC: 33.9 g/dL (ref 30.0–36.0)
MCV: 88.9 fL (ref 80.0–100.0)
Monocytes Absolute: 0.7 10*3/uL (ref 0.1–1.0)
Monocytes Relative: 9 %
Neutro Abs: 4.1 10*3/uL (ref 1.7–7.7)
Neutrophils Relative %: 56 %
Platelets: 281 10*3/uL (ref 150–400)
RBC: 5.14 MIL/uL (ref 4.22–5.81)
RDW: 13 % (ref 11.5–15.5)
WBC: 7.2 10*3/uL (ref 4.0–10.5)
nRBC: 0 % (ref 0.0–0.2)

## 2020-06-02 MED ORDER — IOHEXOL 300 MG/ML  SOLN
75.0000 mL | Freq: Once | INTRAMUSCULAR | Status: AC | PRN
  Administered 2020-06-02: 75 mL via INTRAVENOUS

## 2020-06-02 MED ORDER — MORPHINE SULFATE (PF) 4 MG/ML IV SOLN
4.0000 mg | Freq: Once | INTRAVENOUS | Status: AC
  Administered 2020-06-02: 4 mg via INTRAVENOUS
  Filled 2020-06-02: qty 1

## 2020-06-02 MED ORDER — CLINDAMYCIN HCL 300 MG PO CAPS: 300.0000 mg | ORAL_CAPSULE | Freq: Four times a day (QID) | ORAL | 0 refills | Status: DC

## 2020-06-02 MED ORDER — KETOROLAC TROMETHAMINE 30 MG/ML IJ SOLN
30.0000 mg | Freq: Once | INTRAMUSCULAR | Status: AC
  Administered 2020-06-02: 30 mg via INTRAVENOUS
  Filled 2020-06-02: qty 1

## 2020-06-02 NOTE — ED Triage Notes (Signed)
Patient complains of abscess to left neck for years, seen last week for increased size and pain. Has surgical appointment on 10/11. Complains today of increased pain and increased drainage with any ROM of neck

## 2020-06-02 NOTE — ED Provider Notes (Signed)
[facility] EMERGENCY DEPARTMENT Provider Note   CSN: [id number] Arrival date & time: 06/02/20  7829     History Chief Complaint  Patient presents with  . abscess to neck    Micheal Roth is a 44 y.o. male.  Micheal Roth is a 44 y.o. male with a history of seizures, migraines, chronic pain, GERD and arthritis, who presents to the ED for evaluation of infected cyst on the left side of his neck.  He reports that he has had a cyst there for probably about 5 years, and it has never really caused him problems but last week it became red inflamed, and painful, he was seen in the emergency department on 9/24 and this was felt to likely be infected epidermal inclusion cyst, bedside ultrasound was done which did not show surrounding fluid collection, patient was placed on doxycycline which she has been taking twice daily, he reports that Monday the area ruptured and began draining purulent fluid with some blood, but since then has continued to drain and has become increasingly painful with worsening redness around it.  He thinks some of this redness may be due to irritation from the bandage but he is concerned that pain is worsening.  He has not had any fevers or chills.  Denies any pain with swallowing but does report pain is worse when he turns his head and this also tends to increase drainage from the area.  He has been keeping the area covered.  He is scheduled to follow-up with general surgery regarding the cyst on 10/11 but was worried due to worsening pain that he may need to be evaluated more quickly.  No other aggravating or alleviating factors.        Past Medical History:  Diagnosis Date  . Arthritis   . Chronic back pain   . Chronic left shoulder pain   . Chronic neck pain   . Depression   . GERD (gastroesophageal reflux disease)   . Migraines   . Paresthesia of left arm and leg    and weakness of left arm and leg  . Seizures [facility])     Patient  Active Problem List   Diagnosis Date Noted  . 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  . Ocular pain, right eye-  actually periorbital 01/23/2018  . Exertional epigastric/chest pain 05/03/2017  . H/O: substance abuse ([place]) 05/03/2017  . Nausea and vomiting 04/09/2017  . Elevated triglycerides with high cholesterol 03/22/2017  . Low serum HDL 03/22/2017  . Myalgia 03/22/2017  . Obesity, Class I, BMI 30-34.9 02/22/2017  . Drug abuse, smokes marijuana qd 02/22/2017  . Chronic heartburn 02/22/2017  . Epigastric abdominal pain 02/22/2017  . h/o Seizures ([place]) 04/26/2016  . History of many Head concussions 04/26/2016  . h/o Chronic neck pain 04/26/2016  . History of shingles 04/26/2016  . Neuralgia, postherpetic- R L Ext 04/26/2016  . Edema of right lower extremity 04/26/2016  . h/o Weakness of left arm 07/14/2013  . h/o Paresthesia of left arm 07/14/2013  . h/o Neck pain on left side 03/17/2013  . h/o Pain in joint, R shoulder region 03/17/2013  . Acute confusional state 12/18/2012  . h/o TOBACCO USER 05/10/2009  . h/o Migraine 02/15/2009  . h/o chronic LOW BACK PAIN 07/10/2008  . Adjustment disorder with mixed anxiety and depressed mood 07/04/2007    Past Surgical History:  Procedure Laterality Date  . BACK SURGERY    . KNEE SURGERY    . 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 Diabetes Father   . Diabetes Maternal Grandmother   . Heart disease Maternal Grandmother        CHF  . Heart attack Maternal Grandmother   . Crohn's disease Brother   . Colon cancer Neg Hx   . Colon polyps Neg Hx   . Esophageal cancer Neg Hx   . Stomach cancer Neg Hx   . Rectal cancer Neg Hx     Social History   Tobacco Use  . Smoking status: Former Smoker    Types: Cigarettes    Quit date: 09/04/2013    Years since quitting: 6.7  . Smokeless tobacco: Never Used  Vaping Use  . Vaping Use: Never used    Substance Use Topics  . Alcohol use: No    Comment: rare  . Drug use: Yes    Frequency: 2.0 times per week    Types: Marijuana    Home Medications Prior to Admission medications   Medication Sig Start Date End Date Taking? Authorizing Provider  albuterol (PROVENTIL HFA;VENTOLIN HFA) 108 (90 Base) MCG/ACT inhaler Inhale 2 puffs into the lungs every 6 (six) hours as needed for wheezing or shortness of breath. 08/18/18   Katy Apo, NP  benzonatate (TESSALON) 100 MG capsule Take 1 capsule (100 mg total) by mouth 3 (three) times daily as needed for cough. 08/18/18   Katy Apo, NP  doxycycline (VIBRAMYCIN) 100 MG capsule Take 1 capsule (100 mg total) by mouth 2 (two) times daily. 05/28/20   Blanchie Dessert, MD  omeprazole (PRILOSEC) 20 MG capsule Take 20 mg by mouth daily.    [provider]    Allergies    Patient has no known allergies.  Review of Systems   Review of Systems  Constitutional: Negative for chills and fever.  HENT: Negative for sore throat and trouble swallowing.   Respiratory: Negative for cough, shortness of breath and stridor.   Gastrointestinal: Negative for nausea and vomiting.  Musculoskeletal: Positive for neck pain. Negative for neck stiffness.  Skin: Positive for color change.       inflamed neck cyst  All other systems reviewed and are negative.   Physical Exam Updated Vital Signs BP (!) 145/96 (BP Location: Left Arm)   Pulse 68   Temp 97.9 F (36.6 C) (Oral)   Resp 14   Ht 6' (1.829 m)   Wt 95.3 kg   SpO2 96%   BMI 28.48 kg/m   Physical Exam Vitals and nursing note reviewed.  Constitutional:      General: He is not in acute distress.    Appearance: Normal appearance. He is well-developed. He is not ill-appearing or diaphoretic.     Comments: Well-appearing and in no distress  HENT:     Head: Normocephalic and atraumatic.     Right Ear: External ear normal.     Left Ear: External ear normal.     Nose: Nose normal.      Mouth/Throat:     Mouth: Mucous membranes are moist.     Pharynx: Oropharynx is clear.  Eyes:     General:        Right eye: No discharge.        Left eye: No discharge.  Neck:     Trachea: No tracheal deviation.      Comments: See photos below  Cardiovascular:     Rate and Rhythm: Normal rate and regular rhythm.  Pulmonary:     Effort: Pulmonary effort is normal. No respiratory distress.     Breath sounds: Normal breath sounds.  Abdominal:     General: There is no distension.     Palpations: Abdomen is soft.     Tenderness: There is no abdominal tenderness.  Musculoskeletal:        General: No deformity.     Cervical back: Neck supple.  Skin:    General: Skin is warm and dry.  Neurological:     Mental Status: He is alert and oriented to person, place, and time.     Coordination: Coordination normal.     Comments: Speech is clear, able to follow commands Moves extremities without ataxia, coordination intact  Psychiatric:        Mood and Affect: Mood normal.        Behavior: Behavior normal.         ED Results / Procedures / Treatments   Labs (all labs ordered are listed, but only abnormal results are displayed) Labs Reviewed  BASIC METABOLIC PANEL - Abnormal; Notable for the following components:      Result Value   Glucose, Bld 111 (*)    Calcium 8.8 (*)    All other components within normal limits  CBC WITH DIFFERENTIAL/PLATELET    EKG None  Radiology CT Soft Tissue Neck W Contrast  Result Date: 06/02/2020 CLINICAL DATA:  Neck mass, initial workup. EXAM: CT NECK WITH CONTRAST TECHNIQUE: Multidetector CT imaging of the neck was performed using the standard protocol following the bolus administration of intravenous contrast. CONTRAST:  62mL OMNIPAQUE IOHEXOL 300 MG/ML  SOLN COMPARISON:  No pertinent prior exams are available for comparison. FINDINGS: Pharynx and larynx: There is no appreciable swelling or discrete mass within the oral cavity, pharynx or  larynx. Salivary glands: No inflammation, mass, or stone. Thyroid: Unremarkable Lymph nodes: No pathologically enlarged cervical chain lymph nodes. Vascular: The major vascular structures of the neck are patent. Limited intracranial: No acute intracranial abnormality is identified. Visualized orbits: Visualized orbits show no acute finding. Mastoids and visualized paranasal sinuses: Small right sphenoid sinus mucous retention cyst. Minimal ethmoid and right maxillary sinus mucosal thickening. No significant mastoid effusion. Skeleton: No acute bony abnormality or aggressive osseous lesion. Upper chest: No consolidation within the imaged lung apices. Other: There are three nonspecific cutaneous/subcutaneous cystic lesions on the left. This includes a 3.5 cm cystic lesion overlying the left parotid gland (series 3, image 40). The other two cystic lesions overlie the left trapezius muscle measuring 2.6 cm (series 3, image 77) and 1.2 cm (series 3, image 82). The cystic lesions have no associated nodular or masslike enhancement. There is no appreciable surrounding inflammatory stranding. IMPRESSION: 3.5 cm cutaneous/subcutaneous cystic lesion overlying the left parotid gland. Two additional similar appearing cutaneous/subcutaneous cystic lesions overlying the left trapezius muscle more inferiorly. These findings are nonspecific, but could reflect epidermal inclusion cysts. Abscesses are also a consideration, although there is no appreciable surrounding inflammatory stranding. Consider aspiration of these lesions for further evaluation. No pathologically enlarged cervical chain lymph nodes. Incidentally noted, there is a small right sphenoid sinus mucous retention cyst as well as minimal ethmoid and right maxillary sinus mucosal thickening. Electronically Signed   By: Kellie Simmering DO   On: 06/02/2020 14:49    Procedures Procedures (including critical care time)  Medications Ordered in ED Medications  ketorolac  (TORADOL) 30 MG/ML injection  30 mg (30 mg Intravenous Given 06/02/20 1208)  morphine 4 MG/ML injection 4 mg (4 mg Intravenous Given 06/02/20 1356)  iohexol (OMNIPAQUE) 300 MG/ML solution 75 mL (75 mLs Intravenous Contrast Given 06/02/20 1424)    ED Course  I have reviewed the triage vital signs and the nursing notes.  Pertinent labs & imaging results that were available during my care of the patient were reviewed by me and considered in my medical decision making (see chart for details).    MDM Rules/Calculators/A&P                         44 year old male presents with cyst over the left side of his neck which has been present for 5 years but became inflamed last week, he was seen in the ED on Friday and had bedside ultrasound and was prescribed doxycycline.  On Monday an area of this ruptured and he has been having purulent drainage.  He was referred to general surgery and has a follow-up appointment on 10/11 but reports worsening pain and continued drainage, has had some continued spreading redness and got concerned.  No difficulty swallowing or stridor.  No fevers.  Patient is well-appearing.  Superficial palpable cyst noted over the left side of the neck just below the ear, with some expressible purulent drainage, but some surrounding cellulitis spreading down the neck.  Lab work is unremarkable with no leukocytosis or significant electrolyte derangements.  Will get CT soft tissue neck to rule out deeper involvement of neck tissues that would require more emergent evaluation.  CT of the neck shows a 3.5 cutaneous/subcutaneous cystic lesion overlying the left parotid gland and there are 2 similar-appearing cystic lesions overlying the left trapezius muscle more inferiorly, these findings are nonspecific but likely represent epidermal inclusion cyst, abscesses are also a consideration but there is no appreciable surrounding inflammatory stranding, no surrounding fluid collection, no reactive lymph  nodes.  CT is overall reassuring, agree that given that these cysts have been present for many years suspect that they are now inflamed epidermal inclusion cyst.  Do not feel this will require emergent surgical intervention but think patient may be better suited to follow-up with ENT.  I discussed case with Dr. Janace Hoard with ENT who is in agreement, provided outpatient referral #2 patient will also switch him to clindamycin given some continued developing redness.  Patient remains hemodynamically stable with no fevers.  Discharged home in good condition.   Final Clinical Impression(s) / ED Diagnoses Final diagnoses:  Inflamed epidermoid cyst of skin    Rx / DC Orders ED Discharge Orders         Ordered    clindamycin (CLEOCIN) 300 MG capsule  4 times daily        06/02/20 1536           Benedetto Goad [place], [place] 06/02/20 1603    Margette Fast, MD 06/03/20 1011

## 2020-06-02 NOTE — ED Notes (Signed)
Pt transported to CT ?

## 2020-06-02 NOTE — Discharge Instructions (Addendum)
Please call this phone number [phone] to schedule a follow-up appointment with ENT.  Start taking clindamycin, stop doxycycline.  You can apply warm compresses to help with drainage, pain and swelling. Use ibuprofen and Tylenol regularly to help with pain and inflammation. If you develop fever, worsening swelling, difficulty swallowing or any other new or concerning symptoms please return to the ED for reevaluation.

## 2020-06-15 ENCOUNTER — Other Ambulatory Visit: Payer: Self-pay

## 2020-06-15 ENCOUNTER — Encounter (INDEPENDENT_AMBULATORY_CARE_PROVIDER_SITE_OTHER): Payer: Self-pay | Admitting: Otolaryngology

## 2020-06-15 ENCOUNTER — Ambulatory Visit (INDEPENDENT_AMBULATORY_CARE_PROVIDER_SITE_OTHER): Payer: 59 | Admitting: Otolaryngology

## 2020-06-15 VITALS — Temp 96.4°F

## 2020-06-15 DIAGNOSIS — L720 Epidermal cyst: Secondary | ICD-10-CM | POA: Diagnosis not present

## 2020-06-15 NOTE — Progress Notes (Addendum)
HPI: Micheal Roth is a 44 y.o. male who presents is referred by ED for evaluation of infected left parotid subcutaneous cyst.  Patient states that he has had a cyst in the left upper neck for several years but recently got infected where it swelled up 2-3 times its normal size and was painful.  He was seen in the ED and treated initially with doxycycline and then was switched to clindamycin.  It apparently drained and is doing much better presently.  He presents today to discuss possible surgical excision.  Past Medical History:  Diagnosis Date  . Arthritis   . Chronic back pain   . Chronic left shoulder pain   . Chronic neck pain   . Depression   . GERD (gastroesophageal reflux disease)   . Migraines   . Paresthesia of left arm and leg    and weakness of left arm and leg  . Seizures ([place])    Past Surgical History:  Procedure Laterality Date  . BACK SURGERY    . KNEE SURGERY    . 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 Marital status: Single    Spouse name: Not on file  . Number of children: Not on file  . Years of education: Not on file  . Highest education level: Not on file  Occupational History  . Not on file  Tobacco Use  . Smoking status: Current Every Day Smoker    Packs/day: 1.00    Years: 26.00    Pack years: 26.00    Types: Cigarettes    Start date: 44  . Smokeless tobacco: Former Network engineer  . Vaping Use: Never used  Substance and Sexual Activity  . Alcohol use: No    Comment: rare  . Drug use: Yes    Frequency: 2.0 times per week    Types: Marijuana  . Sexual activity: Not on file  Other Topics Concern  . Not on file  Social History Narrative  . Not on file   Social Determinants of Health   Financial Resource Strain:   . Difficulty of Paying Living Expenses: Not on file  Food Insecurity:   . Worried About Charity fundraiser in the Last Year: Not on  file  . Ran Out of Food in the Last Year: Not on file  Transportation Needs:   . Lack of Transportation (Medical): Not on file  . Lack of Transportation (Non-Medical): Not on file  Physical Activity:   . Days of Exercise per Week: Not on file  . Minutes of Exercise per Session: Not on file  Stress:   . Feeling of Stress : Not on file  Social Connections:   . Frequency of Communication with Friends and Family: Not on file  . Frequency of Social Gatherings with Friends and Family: Not on file  . Attends Religious Services: Not on file  . Active Member of Clubs or Organizations: Not on file  . Attends Archivist Meetings: Not on file  . Marital Status: Not on file   Family History  Problem Relation Age of Onset  . Diabetes Father   . Diabetes Maternal Grandmother   . Heart disease Maternal Grandmother        CHF  . Heart attack Maternal Grandmother   . Crohn's disease Brother   . Colon cancer Neg Hx   . Colon polyps Neg  Hx   . Esophageal cancer Neg Hx   . Stomach cancer Neg Hx   . Rectal cancer Neg Hx    No Known Allergies Prior to Admission medications   Medication Sig Start Date End Date Taking? Authorizing Provider  ibuprofen (ADVIL) 200 MG tablet Take 600 mg by mouth as needed for moderate pain.   Yes [provider]  albuterol (PROVENTIL HFA;VENTOLIN HFA) 108 (90 Base) MCG/ACT inhaler Inhale 2 puffs into the lungs every 6 (six) hours as needed for wheezing or shortness of breath. Patient not taking: Reported on 06/02/2020 08/18/18   Katy Apo, NP  clindamycin (CLEOCIN) 300 MG capsule Take 1 capsule (300 mg total) by mouth 4 (four) times daily. X 7 days 06/02/20   Jacqlyn Larsen, PA-C  doxycycline (VIBRAMYCIN) 100 MG capsule Take 1 capsule (100 mg total) by mouth 2 (two) times daily. 05/28/20   Blanchie Dessert, MD  omeprazole (PRILOSEC) 20 MG capsule Take 20 mg by mouth as needed (acid reflux).     [provider]     Positive ROS:  Otherwise negative  All other systems have been reviewed and were otherwise negative with the exception of those mentioned in the HPI and as above.  Physical Exam: Constitutional: Alert, well-appearing, no acute distress Ears: External ears without lesions or tenderness. Ear canals are clear bilaterally with intact, clear TMs.  Nasal: External nose without lesions.. Clear nasal passages Oral: Lips and gums without lesions. Tongue and palate mucosa without lesions. Posterior oropharynx clear. Neck: No palpable adenopathy or masses.  Patient has a soft nontender left upper neck subcutaneous cyst.  It drained from the inferior aspect of the cyst.  Cyst presently measures 3 to 4 cm in size. Cardiac exam: Regular rate and rhythm without murmur Respiratory: Breathing comfortably  Skin: No facial/neck lesions or rash noted.  Procedures  Assessment: Left upper neck cyst  Plan: Discussed excision of this under local anesthetic at his convenience in the next week or 2. Gave him a prescription for clindamycin 300 mg 3 times daily for 1 week if he develops any swelling or erythema prior to surgical excision.   Radene Journey, MD   CC:

## 2020-06-24 DIAGNOSIS — D170 Benign lipomatous neoplasm of skin and subcutaneous tissue of head, face and neck: Secondary | ICD-10-CM | POA: Diagnosis not present

## 2020-06-29 ENCOUNTER — Encounter (INDEPENDENT_AMBULATORY_CARE_PROVIDER_SITE_OTHER): Payer: Self-pay | Admitting: Otolaryngology

## 2020-06-29 ENCOUNTER — Ambulatory Visit (INDEPENDENT_AMBULATORY_CARE_PROVIDER_SITE_OTHER): Payer: 59 | Admitting: Otolaryngology

## 2020-06-29 ENCOUNTER — Other Ambulatory Visit: Payer: Self-pay

## 2020-06-29 VITALS — Temp 97.7°F

## 2020-06-29 DIAGNOSIS — Z4889 Encounter for other specified surgical aftercare: Secondary | ICD-10-CM

## 2020-06-29 NOTE — Progress Notes (Signed)
HPI: Micheal Roth is a 44 y.o. male who presents 5 days s/p removal of left cheek sebaceous cyst.  He is doing well with minimal discomfort..   Past Medical History:  Diagnosis Date  . Arthritis   . Chronic back pain   . Chronic left shoulder pain   . Chronic neck pain   . Depression   . GERD (gastroesophageal reflux disease)   . Migraines   . Paresthesia of left arm and leg    and weakness of left arm and leg  . Seizures ([place])    Past Surgical History:  Procedure Laterality Date  . BACK SURGERY    . KNEE SURGERY    . 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 Marital status: Single    Spouse name: Not on file  . Number of children: Not on file  . Years of education: Not on file  . Highest education level: Not on file  Occupational History  . Not on file  Tobacco Use  . Smoking status: Current Every Day Smoker    Packs/day: 1.00    Years: 26.00    Pack years: 26.00    Types: Cigarettes    Start date: 88  . Smokeless tobacco: Former Network engineer  . Vaping Use: Never used  Substance and Sexual Activity  . Alcohol use: No    Comment: rare  . Drug use: Yes    Frequency: 2.0 times per week    Types: Marijuana  . Sexual activity: Not on file  Other Topics Concern  . Not on file  Social History Narrative  . Not on file   Social Determinants of Health   Financial Resource Strain:   . Difficulty of Paying Living Expenses: Not on file  Food Insecurity:   . Worried About Charity fundraiser in the Last Year: Not on file  . Ran Out of Food in the Last Year: Not on file  Transportation Needs:   . Lack of Transportation (Medical): Not on file  . Lack of Transportation (Non-Medical): Not on file  Physical Activity:   . Days of Exercise per Week: Not on file  . Minutes of Exercise per Session: Not on file  Stress:   . Feeling of Stress : Not on file  Social Connections:    . Frequency of Communication with Friends and Family: Not on file  . Frequency of Social Gatherings with Friends and Family: Not on file  . Attends Religious Services: Not on file  . Active Member of Clubs or Organizations: Not on file  . Attends Archivist Meetings: Not on file  . Marital Status: Not on file   Family History  Problem Relation Age of Onset  . Diabetes Father   . Diabetes Maternal Grandmother   . Heart disease Maternal Grandmother        CHF  . Heart attack Maternal Grandmother   . Crohn's disease Brother   . Colon cancer Neg Hx   . Colon polyps Neg Hx   . Esophageal cancer Neg Hx   . Stomach cancer Neg Hx   . Rectal cancer Neg Hx    No Known Allergies Prior to Admission medications   Medication Sig Start Date End Date Taking? Authorizing Provider  ibuprofen (ADVIL) 200 MG tablet Take 600 mg by mouth as needed for moderate pain.   Yes [provider]  albuterol (PROVENTIL HFA;VENTOLIN HFA) 108 (90 Base) MCG/ACT inhaler Inhale 2 puffs into the lungs every 6 (six) hours as needed for wheezing or shortness of breath. Patient not taking: Reported on 06/02/2020 08/18/18   Katy Apo, NP  clindamycin (CLEOCIN) 300 MG capsule Take 1 capsule (300 mg total) by mouth 4 (four) times daily. X 7 days 06/02/20   Jacqlyn Larsen, PA-C  doxycycline (VIBRAMYCIN) 100 MG capsule Take 1 capsule (100 mg total) by mouth 2 (two) times daily. 05/28/20   Blanchie Dessert, MD  omeprazole (PRILOSEC) 20 MG capsule Take 20 mg by mouth as needed (acid reflux).     [provider]     Physical Exam: Incision site is healing nicely with no signs of infection and no swelling   Assessment: S/p excision of left parotid sebaceous cyst  Plan: He will follow-up as needed   Radene Journey, MD

## 2020-11-02 ENCOUNTER — Ambulatory Visit
Admission: EM | Admit: 2020-11-02 | Discharge: 2020-11-02 | Disposition: A | Discharge status: Home / Self Care | Payer: 59 | Attending: Urgent Care | Admitting: Urgent Care

## 2020-11-02 ENCOUNTER — Encounter: Payer: Self-pay | Admitting: Urgent Care

## 2020-11-02 ENCOUNTER — Other Ambulatory Visit: Payer: Self-pay

## 2020-11-02 DIAGNOSIS — R52 Pain, unspecified: Secondary | ICD-10-CM | POA: Insufficient documentation

## 2020-11-02 DIAGNOSIS — R509 Fever, unspecified: Secondary | ICD-10-CM | POA: Insufficient documentation

## 2020-11-02 DIAGNOSIS — B349 Viral infection, unspecified: Secondary | ICD-10-CM | POA: Insufficient documentation

## 2020-11-02 DIAGNOSIS — R070 Pain in throat: Secondary | ICD-10-CM | POA: Diagnosis present

## 2020-11-02 DIAGNOSIS — Z20822 Contact with and (suspected) exposure to covid-19: Secondary | ICD-10-CM | POA: Diagnosis not present

## 2020-11-02 LAB — POCT RAPID STREP A (OFFICE): Rapid Strep A Screen: NEGATIVE

## 2020-11-02 MED ORDER — ACETAMINOPHEN 325 MG PO TABS
650.0000 mg | ORAL_TABLET | Freq: Once | ORAL | Status: AC
  Administered 2020-11-02: 650 mg via ORAL

## 2020-11-02 MED ORDER — OSELTAMIVIR PHOSPHATE 75 MG PO CAPS: 75.0000 mg | ORAL_CAPSULE | Freq: Two times a day (BID) | ORAL | 0 refills | Status: DC

## 2020-11-02 NOTE — ED Triage Notes (Signed)
Pt c/o sore throat, chills, fever, and body aches since yesterday. States took ibuprofen at [facility].

## 2020-11-02 NOTE — ED Provider Notes (Signed)
Micheal Roth   MRN: [id number] DOB: 16-Jan-1976  Subjective:   Micheal Roth is a 45 y.o. male presenting for 1 day history of acute onset body aches, chills, throat pain, fever. Has used ibuprofen, Tylenol. Has not had COVID vaccination.  Denies cough, chest pain, shortness of breath, rashes.  Patient is not COVID vaccinated.  Denies history of Covid.  Patient is a smoker, has a history of exercise-induced asthma.   Current Facility-Administered Medications:  .  acetaminophen (TYLENOL) tablet 650 mg, 650 mg, Oral, Once, Jaynee Eagles, PA-C  Current Outpatient Medications:  .  ibuprofen (ADVIL) 200 MG tablet, Take 600 mg by mouth as needed for moderate pain., Disp: , Rfl:  .  omeprazole (PRILOSEC) 20 MG capsule, Take 20 mg by mouth as needed (acid reflux). , Disp: , Rfl:    No Known Allergies  Past Medical History:  Diagnosis Date  . Arthritis   . Chronic back pain   . Chronic left shoulder pain   . Chronic neck pain   . Depression   . GERD (gastroesophageal reflux disease)   . Migraines   . Paresthesia of left arm and leg    and weakness of left arm and leg  . Seizures ([place])      Past Surgical History:  Procedure Laterality Date  . BACK SURGERY    . KNEE SURGERY    . 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 Diabetes Father   . Diabetes Maternal Grandmother   . Heart disease Maternal Grandmother        CHF  . Heart attack Maternal Grandmother   . Crohn's disease Brother   . Colon cancer Neg Hx   . Colon polyps Neg Hx   . Esophageal cancer Neg Hx   . Stomach cancer Neg Hx   . Rectal cancer Neg Hx     Social History   Tobacco Use  . Smoking status: Current Every Day Smoker    Packs/day: 1.00    Years: 26.00    Pack years: 26.00    Types: Cigarettes    Start date: 72  . Smokeless tobacco: Former Network engineer  . Vaping Use: Never used   Substance Use Topics  . Alcohol use: No    Comment: rare  . Drug use: Yes    Frequency: 2.0 times per week    Types: Marijuana    ROS   Objective:   Vitals: BP (!) 155/96 (BP Location: Left Arm)   Pulse 91   Temp 99.9 F (37.7 C) (Oral)   Resp 18   SpO2 98%   Physical Exam Constitutional:      General: He is not in acute distress.    Appearance: Normal appearance. He is well-developed and normal weight. He is not ill-appearing, toxic-appearing or diaphoretic.  HENT:     Head: Normocephalic and atraumatic.     Right Ear: External ear normal.     Left Ear: External ear normal.     Nose: Nose normal.     Mouth/Throat:     Pharynx: Oropharynx is clear.  Eyes:     General: No scleral icterus.       Right eye: No discharge.        Left eye: No discharge.     Extraocular Movements: Extraocular movements intact.     Pupils: Pupils are equal, round,  and reactive to light.  Cardiovascular:     Rate and Rhythm: Normal rate.  Pulmonary:     Effort: Pulmonary effort is normal.  Musculoskeletal:     Cervical back: Normal range of motion.  Skin:    General: Skin is warm and dry.  Neurological:     Mental Status: He is alert and oriented to person, place, and time.  Psychiatric:        Mood and Affect: Mood normal.        Behavior: Behavior normal.        Thought Content: Thought content normal.        Judgment: Judgment normal.    Results for orders placed or performed during the hospital encounter of 11/02/20 (from the past 24 hour(s))  POCT rapid strep A     Status: None   Collection Time: 11/02/20  8:45 AM  Result Value Ref Range   Rapid Strep A Screen Negative Negative    Assessment and Plan :   PDMP not reviewed this encounter.  1. Viral syndrome   2. Encounter for screening laboratory testing for COVID-19 virus   3. Throat pain   4. Fever, unspecified   5. Body aches     Counseled patient against flu testing as it is not reliable.  Offered him Tamiflu  to cover for this empirically.  COVID-19, strep culture pending.  Use supportive care otherwise. Counseled patient on potential for adverse effects with medications prescribed/recommended today, ER and return-to-clinic precautions discussed, patient verbalized understanding.    Jaynee Eagles, [place] 11/02/20 [phone]

## 2020-11-02 NOTE — Discharge Instructions (Signed)

## 2020-11-03 LAB — COVID-19, FLU A+B NAA
Influenza A, NAA: NOT DETECTED
Influenza B, NAA: NOT DETECTED
SARS-CoV-2, NAA: NOT DETECTED

## 2020-11-04 LAB — CULTURE, GROUP A STREP (THRC)

## 2021-02-23 ENCOUNTER — Telehealth: Payer: 59 | Admitting: Physician Assistant

## 2021-02-23 ENCOUNTER — Encounter: Payer: Self-pay | Admitting: Physician Assistant

## 2021-02-23 ENCOUNTER — Other Ambulatory Visit: Payer: Self-pay

## 2021-02-23 DIAGNOSIS — L247 Irritant contact dermatitis due to plants, except food: Secondary | ICD-10-CM

## 2021-02-23 MED ORDER — TRIAMCINOLONE ACETONIDE 0.1 % EX CREA: 1.0000 "application " | TOPICAL_CREAM | Freq: Two times a day (BID) | CUTANEOUS | 0 refills | Status: DC

## 2021-02-23 MED ORDER — PREDNISONE 10 MG (21) PO TBPK: ORAL_TABLET | ORAL | 0 refills | Status: DC

## 2021-02-23 NOTE — Patient Instructions (Signed)
Rash, Adult °A rash is a change in the color of your skin. A rash can also change the way your skin feels. There are many different conditions and factors that can cause a rash. Some rashes may disappear after a few days, but some may last for a few weeks. Common causes of rashes include: °Viral infections, such as: °Colds. °Measles. °Hand, foot, and mouth disease. °Bacterial infections, such as: °Scarlet fever. °Impetigo. °Fungal infections, such as Candida. °Allergic reactions to food, medicines, or skin care products. °Follow these instructions at home: °The goal of treatment is to stop the itching and keep the rash from spreading. Pay attention to any changes in your symptoms. Follow these instructions to help with your condition: °Medicine °Take or apply over-the-counter and prescription medicines only as told by your health care provider. These may include: °Corticosteroid creams to treat red or swollen skin. °Anti-itch lotions. °Oral allergy medicines (antihistamines). °Oral corticosteroids for severe symptoms. ° °Skin care °Apply cool compresses to the affected areas. °Do not scratch or rub your skin. °Avoid covering the rash. Make sure the rash is exposed to air as much as possible. °Managing itching and discomfort °Avoid hot showers or baths, which can make itching worse. A cold shower may help. °Try taking a bath with: °Epsom salts. Follow manufacturer instructions on the packaging. You can get these at your local pharmacy or grocery store. °Baking soda. Pour a small amount into the bath as told by your health care provider. °Colloidal oatmeal. Follow manufacturer instructions on the packaging. You can get this at your local pharmacy or grocery store. °Try applying baking soda paste to your skin. Stir water into baking soda until it reaches a paste-like consistency. °Try applying calamine lotion. This is an over-the-counter lotion that helps to relieve itchiness. °Keep cool and out of the sun. Sweating  and being hot can make itching worse. °General instructions ° °Rest as needed. °Drink enough fluid to keep your urine pale yellow. °Wear loose-fitting clothing. °Avoid scented soaps, detergents, and perfumes. Use gentle soaps, detergents, perfumes, and other cosmetic products. °Avoid any substance that causes your rash. Keep a journal to help track what causes your rash. Write down: °What you eat. °What cosmetic products you use. °What you drink. °What you wear. This includes jewelry. °Keep all follow-up visits as told by your health care provider. This is important. °Contact a health care provider if: °You sweat at night. °You lose weight. °You urinate more than normal. °You urinate less than normal, or you notice that your urine is a darker color than usual. °You feel weak. °You vomit. °Your skin or the whites of your eyes look yellow (jaundice). °Your skin: °Tingles. °Is numb. °Your rash: °Does not go away after several days. °Gets worse. °You are: °Unusually thirsty. °More tired than normal. °You have: °New symptoms. °Pain in your abdomen. °A fever. °Diarrhea. °Get help right away if you: °Have a fever and your symptoms suddenly get worse. °Develop confusion. °Have a severe headache or a stiff neck. °Have severe joint pains or stiffness. °Have a seizure. °Develop a rash that covers all or most of your body. The rash may or may not be painful. °Develop blisters that: °Are on top of the rash. °Grow larger or grow together. °Are painful. °Are inside your nose or mouth. °Develop a rash that: °Looks like purple pinprick-sized spots all over your body. °Has a "bull's eye" or looks like a target. °Is not related to sun exposure, is red and painful, and causes   your skin to peel. °Summary °A rash is a change in the color of your skin. Some rashes disappear after a few days, but some may last for a few weeks. °The goal of treatment is to stop the itching and keep the rash from spreading. °Take or apply over-the-counter  and prescription medicines only as told by your health care provider. °Contact a health care provider if you have new or worsening symptoms. °Keep all follow-up visits as told by your health care provider. This is important. °This information is not intended to replace advice given to you by your health care provider. Make sure you discuss any questions you have with your health care provider. °Document Revised: 12/13/2018 Document Reviewed: 03/25/2018 °Elsevier Patient Education © 2022 [employer] ° °

## 2021-02-23 NOTE — Progress Notes (Signed)
Mr. euriah, matlack are scheduled for a virtual visit with your provider today.    Just as we do with appointments in the office, we must obtain your consent to participate.  Your consent will be active for this visit and any virtual visit you may have with one of our providers in the next 365 days.    If you have a MyChart account, I can also send a copy of this consent to you electronically.  All virtual visits are billed to your insurance company just like a traditional visit in the office.  As this is a virtual visit, video technology does not allow for your provider to perform a traditional examination.  This may limit your provider's ability to fully assess your condition.  If your provider identifies any concerns that need to be evaluated in person or the need to arrange testing such as labs, EKG, etc, we will make arrangements to do so.    Although advances in technology are sophisticated, we cannot ensure that it will always work on either your end or our end.  If the connection with a video visit is poor, we may have to switch to a telephone visit.  With either a video or telephone visit, we are not always able to ensure that we have a secure connection.   I need to obtain your verbal consent now.   Are you willing to proceed with your visit today?   Micheal Roth has provided verbal consent on 02/23/2021 for a virtual visit (video or telephone).   Mar Daring, PA-C 02/23/2021  10:24 AM    MyChart Video Visit    Virtual Visit via Video Note   This visit type was conducted due to national recommendations for restrictions regarding the COVID-19 Pandemic (e.g. social distancing) in an effort to limit this patient's exposure and mitigate transmission in our community. This patient is at least at moderate risk for complications without adequate follow up. This format is felt to be most appropriate for this patient at this time. Physical exam was limited by quality of the video and  audio technology used for the visit.   Patient location: Home Provider location: Home office in [place]  I discussed the limitations of evaluation and management by telemedicine and the availability of in person appointments. The patient expressed understanding and agreed to proceed.  Patient: Micheal Roth   DOB: 1976/02/04   45 y.o. Male  MRN: [id number] Visit Date: 02/23/2021  Today's healthcare provider: Mar Daring, PA-C   No chief complaint on file.  Subjective    Rash This is a new problem. The current episode started yesterday. The affected locations include the left arm, right arm, genitalia and neck. The rash is characterized by itchiness and redness. He was exposed to plant contact. Past treatments include nothing. The treatment provided no relief.   He has not been sexually active recently.  Patient Active Problem List   Diagnosis Date Noted   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   Ocular pain, right eye-  actually periorbital 01/23/2018   Exertional epigastric/chest pain 05/03/2017   H/O: substance abuse ([place]) 05/03/2017   Nausea and vomiting 04/09/2017   Elevated triglycerides with high cholesterol 03/22/2017   Low serum HDL 03/22/2017   Myalgia 03/22/2017   Obesity, Class I, BMI 30-34.9 02/22/2017   Drug abuse, smokes marijuana qd 02/22/2017   Chronic heartburn 02/22/2017   Epigastric abdominal pain 02/22/2017   h/o Seizures ([place]) 04/26/2016   History of many Head  concussions 04/26/2016   h/o Chronic neck pain 04/26/2016   History of shingles 04/26/2016   Neuralgia, postherpetic- R L Ext 04/26/2016   Edema of right lower extremity 04/26/2016   h/o Weakness of left arm 07/14/2013   h/o Paresthesia of left arm 07/14/2013   h/o Neck pain on left side 03/17/2013   h/o Pain in joint, R shoulder region 03/17/2013   Acute confusional state 12/18/2012   h/o TOBACCO USER 05/10/2009   h/o Migraine 02/15/2009   h/o chronic LOW BACK PAIN 07/10/2008    Adjustment disorder with mixed anxiety and depressed mood 07/04/2007   Past Medical History:  Diagnosis Date   Arthritis    Chronic back pain    Chronic left shoulder pain    Chronic neck pain    Depression    GERD (gastroesophageal reflux disease)    Migraines    Paresthesia of left arm and leg    and weakness of left arm and leg   Seizures ([facility])       Medications: Outpatient Medications Prior to Visit  Medication Sig   ibuprofen (ADVIL) 200 MG tablet Take 600 mg by mouth as needed for moderate pain.   omeprazole (PRILOSEC) 20 MG capsule Take 20 mg by mouth as needed (acid reflux).    oseltamivir (TAMIFLU) 75 MG capsule Take 1 capsule (75 mg total) by mouth 2 (two) times daily.   No facility-administered medications prior to visit.    Review of Systems  Constitutional: Negative.   Respiratory: Negative.    Cardiovascular: Negative.   Skin:  Positive for rash.   Last CBC Lab Results  Component Value Date   WBC 7.2 06/02/2020   HGB 15.5 06/02/2020   HCT 45.7 06/02/2020   MCV 88.9 06/02/2020   MCH 30.2 06/02/2020   RDW 13.0 06/02/2020   PLT 281 02/28/9484   Last metabolic panel Lab Results  Component Value Date   GLUCOSE 111 (H) 06/02/2020   NA 138 06/02/2020   K 3.9 06/02/2020   CL 105 06/02/2020   CO2 27 06/02/2020   BUN 10 06/02/2020   CREATININE 0.85 06/02/2020   GFRNONAA >60 06/02/2020   GFRAA >60 06/02/2020   CALCIUM 8.8 (L) 06/02/2020   PROT 7.7 04/11/2017   ALBUMIN 4.1 04/11/2017   BILITOT 0.9 04/11/2017   ALKPHOS 53 04/11/2017   AST 21 04/11/2017   ALT 26 04/11/2017   ANIONGAP 6 06/02/2020      Objective    There were no vitals taken for this visit. BP Readings from Last 3 Encounters:  11/02/20 (!) 155/96  06/02/20 (!) 142/85  05/28/20 (!) 142/85   Wt Readings from Last 3 Encounters:  06/02/20 210 lb (95.3 kg)  05/28/20 210 lb (95.3 kg)  07/18/18 235 lb (106.6 kg)      Physical Exam Vitals reviewed.  Constitutional:       General: He is not in acute distress.    Appearance: Normal appearance. He is well-developed. He is not ill-appearing.  HENT:     Head: Normocephalic and atraumatic.  Eyes:     Conjunctiva/sclera: Conjunctivae normal.  Pulmonary:     Effort: Pulmonary effort is normal. No respiratory distress.  Musculoskeletal:     Cervical back: Normal range of motion and neck supple.  Skin:    Findings: Rash (erythematous patches shown on antecubital area of left arm, dorsal surface of left wrist; reports similar areas on right arm, neck and genitalia) present.  Neurological:     Mental Status:  He is alert.  Psychiatric:        Mood and Affect: Mood normal.        Behavior: Behavior normal.        Thought Content: Thought content normal.        Judgment: Judgment normal.       Assessment & Plan     1. Irritant contact dermatitis due to plants, except food - Suspect contact dermatitis from unknown exposure; reports he did have poison oak 2 weeks ago - Will treat with prednisone and triamcinolone as below - Seek in-person evaluation if not improving or if worsening.  - predniSONE (STERAPRED UNI-PAK 21 TAB) 10 MG (21) TBPK tablet; 6 day taper; take as directed on package instructions  Dispense: 21 tablet; Refill: 0 - triamcinolone cream (KENALOG) 0.1 %; Apply 1 application topically 2 (two) times daily.  Dispense: 30 g; Refill: 0   No follow-ups on file.     I discussed the assessment and treatment plan with the patient. The patient was provided an opportunity to ask questions and all were answered. The patient agreed with the plan and demonstrated an understanding of the instructions.   The patient was advised to call back or seek an in-person evaluation if the symptoms worsen or if the condition fails to improve as anticipated.  I provided 12 minutes of face-to-face time during this encounter via MyChart Video enabled encounter.   [place]ye [place] Pender [phone] (phone) [phone] (fax)  [place]

## 2021-03-14 ENCOUNTER — Encounter (INDEPENDENT_AMBULATORY_CARE_PROVIDER_SITE_OTHER): Payer: Self-pay

## 2021-06-13 ENCOUNTER — Other Ambulatory Visit: Payer: Self-pay

## 2021-06-13 ENCOUNTER — Ambulatory Visit (INDEPENDENT_AMBULATORY_CARE_PROVIDER_SITE_OTHER): Payer: 59

## 2021-06-13 ENCOUNTER — Encounter: Payer: Self-pay | Admitting: Emergency Medicine

## 2021-06-13 ENCOUNTER — Ambulatory Visit
Admission: EM | Admit: 2021-06-13 | Discharge: 2021-06-13 | Disposition: A | Discharge status: Home / Self Care | Payer: 59 | Attending: Physician Assistant | Admitting: Physician Assistant

## 2021-06-13 DIAGNOSIS — M25561 Pain in right knee: Secondary | ICD-10-CM

## 2021-06-13 DIAGNOSIS — M25461 Effusion, right knee: Secondary | ICD-10-CM

## 2021-06-13 MED ORDER — PREDNISONE 20 MG PO TABS: 40.0000 mg | ORAL_TABLET | Freq: Every day | ORAL | 0 refills | Status: AC
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
## 2021-06-13 NOTE — Discharge Instructions (Signed)
Take prednisone as prescribed. Follow up with any further concerns.
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
## 2021-06-14 DIAGNOSIS — M25561 Pain in right knee: Secondary | ICD-10-CM | POA: Insufficient documentation

## 2021-12-19 ENCOUNTER — Emergency Department (HOSPITAL_COMMUNITY)
Admission: EM | Admit: 2021-12-19 | Discharge: 2021-12-19 | Disposition: A | Discharge status: Home / Self Care | Payer: 59 | Attending: Emergency Medicine | Admitting: Emergency Medicine

## 2021-12-19 ENCOUNTER — Encounter (HOSPITAL_COMMUNITY): Payer: Self-pay | Admitting: Emergency Medicine

## 2021-12-19 ENCOUNTER — Other Ambulatory Visit: Payer: Self-pay

## 2021-12-19 DIAGNOSIS — K0889 Other specified disorders of teeth and supporting structures: Secondary | ICD-10-CM | POA: Diagnosis present

## 2021-12-19 DIAGNOSIS — J3489 Other specified disorders of nose and nasal sinuses: Secondary | ICD-10-CM | POA: Insufficient documentation

## 2021-12-19 DIAGNOSIS — J349 Unspecified disorder of nose and nasal sinuses: Secondary | ICD-10-CM

## 2021-12-19 DIAGNOSIS — K047 Periapical abscess without sinus: Secondary | ICD-10-CM | POA: Diagnosis not present

## 2021-12-19 MED ORDER — CLINDAMYCIN HCL 150 MG PO CAPS
300.0000 mg | ORAL_CAPSULE | Freq: Four times a day (QID) | ORAL | 0 refills | Status: AC
Start: 2021-12-19 — End: 2021-12-29

## 2021-12-19 MED ORDER — HYDROCODONE-ACETAMINOPHEN 5-325 MG PO TABS: 1.0000 | ORAL_TABLET | ORAL | 0 refills | Status: DC | PRN

## 2021-12-19 MED ORDER — CLINDAMYCIN HCL 150 MG PO CAPS: 300.0000 mg | ORAL_CAPSULE | Freq: Four times a day (QID) | ORAL | 0 refills | Status: DC

## 2021-12-19 NOTE — ED Triage Notes (Signed)
C/o R upper dental pain since last week.  Pain worse since Saturday night.  Swelling to R face since yesterday with increased swelling moving under R eye this morning.  Reports fever and chills. ?

## 2021-12-19 NOTE — ED Provider Notes (Signed)
?[facility] ?Provider Note ? ? ?CSN: [id number] ?Arrival date & time: 12/19/21  0950 ? ?  ? ?History ? ?Chief Complaint  ?Patient presents with  ? Abscess  ? Dental Pain  ? ? ?Micheal Roth is a 46 y.o. male. ? ?Pt complains of facial swelling.  Pt reports he had sinus pain but also has a bad tooth.  Face is painful and swollen  ? ?The history is provided by the patient. No language interpreter was used.  ?Dental Pain ?Location:  Upper ?Quality:  Aching ?Severity:  Moderate ?Onset quality:  Gradual ?Progression:  Worsening ?Chronicity:  New ?Relieved by:  Nothing ?Ineffective treatments:  None tried ?Associated symptoms: congestion   ? ?  ? ?Home Medications ?Prior to Admission medications   ?Medication Sig Start Date End Date Taking? Authorizing Provider  ?ibuprofen (ADVIL) 200 MG tablet Take 800 mg by mouth every 8 (eight) hours as needed for moderate pain.   Yes [provider]  ?omeprazole (PRILOSEC OTC) 20 MG tablet Take 20 mg by mouth daily.   Yes [provider]  ?clindamycin (CLEOCIN) 150 MG capsule Take 2 capsules (300 mg total) by mouth 4 (four) times daily for 10 days. 12/19/21 12/29/21  Fransico Meadow, PA-C  ?HYDROcodone-acetaminophen (NORCO/VICODIN) 5-325 MG tablet Take 1 tablet by mouth every 4 (four) hours as needed for moderate pain. 12/19/21 12/19/22  Fransico Meadow, PA-C  ?triamcinolone cream (KENALOG) 0.1 % Apply 1 application topically 2 (two) times daily. ?Patient not taking: Reported on 12/19/2021 02/23/21   Mar Daring, PA-C  ?   ? ?Allergies    ?Patient has no known allergies.   ? ?Review of Systems   ?Review of Systems  ?HENT:  Positive for congestion.   ?All other systems reviewed and are negative. ? ?Physical Exam ?Updated Vital Signs ?BP 135/88 (BP Location: Right Arm)   Pulse 80   Temp 98.4 ?F (36.9 ?C) (Oral)   Resp 16   SpO2 98%  ?Physical Exam ?Vitals reviewed.  ?Constitutional:   ?   Appearance: Normal appearance.   ?HENT:  ?   Head: Normocephalic.  ?   Nose: Nose normal.  ?   Mouth/Throat:  ?   Mouth: Mucous membranes are moist.  ?   Comments: Swelling right face below eye  tender maxillary sinus swelling upper gumline dental caries ?Cardiovascular:  ?   Rate and Rhythm: Normal rate and regular rhythm.  ?Pulmonary:  ?   Effort: Pulmonary effort is normal.  ?Abdominal:  ?   General: Abdomen is flat.  ?Musculoskeletal:     ?   General: Normal range of motion.  ?Skin: ?   General: Skin is warm.  ?Neurological:  ?   General: No focal deficit present.  ?   Mental Status: He is alert.  ?Psychiatric:     ?   Mood and Affect: Mood normal.  ? ? ?ED Results / Procedures / Treatments   ?Labs ?(all labs ordered are listed, but only abnormal results are displayed) ?Labs Reviewed - No data to display ? ?EKG ?None ? ?Radiology ?No results found. ? ?Procedures ?Procedures  ? ? ?Medications Ordered in ED ?Medications - No data to display ? ?ED Course/ Medical Decision Making/ A&P ?  ?                        ?Medical Decision Making ?Pt reports sinus congestion but also a bad tooth ? ?Amount  and/or Complexity of Data Reviewed ?Independent Historian: spouse ?   Details: family with pt reports increased swelling today ? ?Risk ?Prescription drug management. ?Risk Details: I suspect sinus infection due to location however pt does have dental disease.  I advised follow up with dentist for evaltuion.  I will treat with clindamycin and hydrocodone  ? ? ? ? ? ? ? ? ? ? ?Final Clinical Impression(s) / ED Diagnoses ?Final diagnoses:  ?Dental abscess  ?Sinus disease  ? ? ?Rx / DC Orders ?ED Discharge Orders   ? ?      Ordered  ?  clindamycin (CLEOCIN) 150 MG capsule  4 times daily,   Status:  Discontinued       ? 12/19/21 1039  ?  HYDROcodone-acetaminophen (NORCO/VICODIN) 5-325 MG tablet  Every 4 hours PRN,   Status:  Discontinued       ? 12/19/21 1043  ?  clindamycin (CLEOCIN) 150 MG capsule  4 times daily       ? 12/19/21 1214  ?   HYDROcodone-acetaminophen (NORCO/VICODIN) 5-325 MG tablet  Every 4 hours PRN       ? 12/19/21 1214  ? ?  ?  ? ?  ?An After Visit Summary was printed and given to the patient. ? ?  ?Fransico Meadow, [place] ?12/19/21 1221 ? ?  ?Godfrey Pick, MD ?12/19/21 1915 ? ?

## 2021-12-19 NOTE — Discharge Instructions (Addendum)
Return if any problems.  Schedule to see the Dentist for evaluation  ?

## 2021-12-19 NOTE — ED Notes (Signed)
Discharged by PA at triage. ?

## 2022-01-26 ENCOUNTER — Telehealth: Payer: 59 | Admitting: Physician Assistant

## 2022-01-26 DIAGNOSIS — A6920 Lyme disease, unspecified: Secondary | ICD-10-CM

## 2022-01-26 MED ORDER — DOXYCYCLINE HYCLATE 100 MG PO TABS
100.0000 mg | ORAL_TABLET | Freq: Two times a day (BID) | ORAL | 0 refills | Status: AC
Start: 2022-01-26 — End: 2022-02-09

## 2022-01-26 NOTE — Patient Instructions (Signed)
  Kelton Pillar, thank you for joining Leeanne Rio, PA-C for today's virtual visit.  While this provider is not your primary care provider (PCP), if your PCP is located in our provider database this encounter information will be shared with them immediately following your visit.  Consent: (Patient) Glena Norfolk Debruyne provided verbal consent for this virtual visit at the beginning of the encounter.  Current Medications:  Current Outpatient Medications:    HYDROcodone-acetaminophen (NORCO/VICODIN) 5-325 MG tablet, Take 1 tablet by mouth every 4 (four) hours as needed for moderate pain., Disp: 20 tablet, Rfl: 0   ibuprofen (ADVIL) 200 MG tablet, Take 800 mg by mouth every 8 (eight) hours as needed for moderate pain., Disp: , Rfl:    omeprazole (PRILOSEC OTC) 20 MG tablet, Take 20 mg by mouth daily., Disp: , Rfl:    triamcinolone cream (KENALOG) 0.1 %, Apply 1 application topically 2 (two) times daily. (Patient not taking: Reported on 12/19/2021), Disp: 30 g, Rfl: 0   Medications ordered in this encounter:  No orders of the defined types were placed in this encounter.    *If you need refills on other medications prior to your next appointment, please contact your pharmacy*  Follow-Up: Call back or seek an in-person evaluation if the symptoms worsen or if the condition fails to improve as anticipated.  Other Instructions Please take the antibiotic as directed. Tylenol alternated with Ibuprofen for headache and body aches.  Increase fluids and get rest. Follow-up as discussed for lab testing. Any acutely worsening symptoms despite treatment -- UC or ER. Please do not delay care!   If you have been instructed to have an in-person evaluation today at a local Urgent Care facility, please use the link below. It will take you to a list of all of our available [facility], including address, phone number and hours of operation. Please do not delay care.  [facility]  If you or a family member do not have a primary care provider, use the link below to schedule a visit and establish care. When you choose a [facility] primary care physician or advanced practice provider, you gain a long-term partner in health. Find a Primary Care Provider  Learn more about [facility] in-office and virtual care options: [facility]

## 2022-01-26 NOTE — Progress Notes (Signed)
Virtual Visit Consent   Micheal Roth, you are scheduled for a virtual visit with a [facility] provider today. Just as with appointments in the office, your consent must be obtained to participate. Your consent will be active for this visit and any virtual visit you may have with one of our providers in the next 365 days. If you have a MyChart account, a copy of this consent can be sent to you electronically.  As this is a virtual visit, video technology does not allow for your provider to perform a traditional examination. This may limit your provider's ability to fully assess your condition. If your provider identifies any concerns that need to be evaluated in person or the need to arrange testing (such as labs, EKG, etc.), we will make arrangements to do so. Although advances in technology are sophisticated, we cannot ensure that it will always work on either your end or our end. If the connection with a video visit is poor, the visit may have to be switched to a telephone visit. With either a video or telephone visit, we are not always able to ensure that we have a secure connection.  By engaging in this virtual visit, you consent to the provision of healthcare and authorize for your insurance to be billed (if applicable) for the services provided during this visit. Depending on your insurance coverage, you may receive a charge related to this service.  I need to obtain your verbal consent now. Are you willing to proceed with your visit today? Micheal Roth has provided verbal consent on 01/26/2022 for a virtual visit (video or telephone). Leeanne Rio, [place]  Date: 01/26/2022 8:49 AM  Virtual Visit via Video Note   I, Leeanne Rio, connected with  Micheal Roth  ([id number], Oct 18, 1975) on 01/26/22 at  8:30 AM EDT by a video-enabled telemedicine application and verified that I am speaking with the correct person using two identifiers.  Location: Patient: Virtual  Visit Location Patient: Home Provider: Virtual Visit Location Provider: Home Office   I discussed the limitations of evaluation and management by telemedicine and the availability of in person appointments. The patient expressed understanding and agreed to proceed.    History of Present Illness: Micheal Roth is a 46 y.o. who identifies as a male who was assigned male at birth, and is being seen today for fever x 2 days (Tmax 101). Notes recent tick bites (7) and is concerned for possible Lyme. Notes body aches and chills with substantial headache. Denies neck stiffness. Denies chest congestion, head congestion or other URI symptoms. Some nausea with an episode of non-bloody emesis yesterday. Denies diarrhea. Has not notes a bulls-eye rash, confusion, rash of hands/feet.    HPI: HPI  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   Ocular pain, right eye-  actually periorbital 01/23/2018   Exertional epigastric/chest pain 05/03/2017   H/O: substance abuse ([place]) 05/03/2017   Nausea and vomiting 04/09/2017   Elevated triglycerides with high cholesterol 03/22/2017   Low serum HDL 03/22/2017   Myalgia 03/22/2017   Obesity, Class I, BMI 30-34.9 02/22/2017   Drug abuse, smokes marijuana qd 02/22/2017   Chronic heartburn 02/22/2017   Epigastric abdominal pain 02/22/2017   h/o Seizures ([place]) 04/26/2016   History of many Head concussions 04/26/2016   h/o Chronic neck pain 04/26/2016   History of shingles 04/26/2016   Neuralgia, postherpetic- R L Ext 04/26/2016   Edema  of right lower extremity 04/26/2016   h/o Weakness of left arm 07/14/2013   h/o Paresthesia of left arm 07/14/2013   h/o Neck pain on left side 03/17/2013   h/o Pain in joint, R shoulder region 03/17/2013   Acute confusional state 12/18/2012   h/o TOBACCO USER 05/10/2009   h/o Migraine 02/15/2009   h/o chronic LOW BACK PAIN 07/10/2008   Adjustment disorder with mixed  anxiety and depressed mood 07/04/2007    Allergies: No Known Allergies Medications:  Current Outpatient Medications:    doxycycline (VIBRA-TABS) 100 MG tablet, Take 1 tablet (100 mg total) by mouth 2 (two) times daily for 14 days., Disp: 28 tablet, Rfl: 0   ibuprofen (ADVIL) 200 MG tablet, Take 800 mg by mouth every 8 (eight) hours as needed for moderate pain., Disp: , Rfl:    omeprazole (PRILOSEC OTC) 20 MG tablet, Take 20 mg by mouth daily., Disp: , Rfl:   Observations/Objective: Patient is well-developed, well-nourished in no acute distress.  Resting comfortably at home.  Head is normocephalic, atraumatic.  No labored breathing. Speech is clear and coherent with logical content.  Patient is alert and oriented at baseline.   Assessment and Plan: 1. Lyme disease, acute - doxycycline (VIBRA-TABS) 100 MG tablet; Take 1 tablet (100 mg total) by mouth 2 (two) times daily for 14 days.  Dispense: 28 tablet; Refill: 0  Concern for acute lyme disease giving history of multiple bites, some attached for prolonged periods of time and current symptoms. Will start Doxycycline. Supportive measures and OTC medications reviewed. Follow-up for official testing discussed. Strict ER precautions reviewed.   Follow Up Instructions: I discussed the assessment and treatment plan with the patient. The patient was provided an opportunity to ask questions and all were answered. The patient agreed with the plan and demonstrated an understanding of the instructions.  A copy of instructions were sent to the patient via MyChart unless otherwise noted below.   The patient was advised to call back or seek an in-person evaluation if the symptoms worsen or if the condition fails to improve as anticipated.  Time:  I spent 10 minutes with the patient via telehealth technology discussing the above problems/concerns.    Leeanne Rio, PA-C

## 2022-03-28 ENCOUNTER — Encounter: Payer: Self-pay | Admitting: Emergency Medicine

## 2022-03-28 ENCOUNTER — Ambulatory Visit
Admission: EM | Admit: 2022-03-28 | Discharge: 2022-03-28 | Disposition: A | Discharge status: Home / Self Care | Payer: 59 | Attending: Internal Medicine | Admitting: Internal Medicine

## 2022-03-28 ENCOUNTER — Other Ambulatory Visit: Payer: Self-pay

## 2022-03-28 DIAGNOSIS — W57XXXA Bitten or stung by nonvenomous insect and other nonvenomous arthropods, initial encounter: Secondary | ICD-10-CM

## 2022-03-28 DIAGNOSIS — R112 Nausea with vomiting, unspecified: Secondary | ICD-10-CM | POA: Diagnosis not present

## 2022-03-28 DIAGNOSIS — R197 Diarrhea, unspecified: Secondary | ICD-10-CM | POA: Diagnosis not present

## 2022-03-28 MED ORDER — ONDANSETRON 4 MG PO TBDP: 4.0000 mg | ORAL_TABLET | Freq: Three times a day (TID) | ORAL | 0 refills | Status: DC | PRN

## 2022-03-28 MED ORDER — DOXYCYCLINE HYCLATE 100 MG PO CAPS: 100.0000 mg | ORAL_CAPSULE | Freq: Two times a day (BID) | ORAL | 0 refills | Status: DC

## 2022-03-28 NOTE — ED Triage Notes (Signed)
Pt sts woke up this am with N/V/D and some body aches; pt sts feels same at when he had "tick fever" in June; pt sts removed several ticks recently

## 2022-03-28 NOTE — Discharge Instructions (Signed)
As we discussed, it is possible that you could have a viral illness or your symptoms could be related to tick bite.  You are being treated for both.  Doxycycline antibiotic has been prescribed to help treat possible tickborne illness.  You have also been prescribed a nausea medication to take as needed.  Please ensure adequate fluid hydration with clear fluids.  Please go to the hospital if symptoms persist or worsen.  Blood work is pending.  We will call if it is abnormal.

## 2022-03-28 NOTE — ED Provider Notes (Addendum)
[facility]    CSN: [id number] Arrival date & time: 03/28/22  5462      History   Chief Complaint Chief Complaint  Patient presents with   Vomiting   Generalized Body Aches    HPI Micheal Roth is a 46 y.o. male.   Patient presents with nausea, vomiting, diarrhea, generalized body aches that started this morning upon awakening.  Denies any associated upper respiratory symptoms, cough, known fevers.  Denies any known sick contacts.  He is concerned for tickborne illness as he removed 2 ticks from 2 areas of his right leg about a week ago.  He is not sure how long they were attached.  He reports that he had similar symptoms a few months prior after a tick bite so he is concerned that it is related.  Denies blood in stool or emesis.  He has been able to keep some water down today.  Denies any associated abdominal pain.     Past Medical History:  Diagnosis Date   Arthritis    Chronic back pain    Chronic left shoulder pain    Chronic neck pain    Depression    GERD (gastroesophageal reflux disease)    Migraines    Paresthesia of left arm and leg    and weakness of left arm and leg   Seizures ([place])     Patient Active Problem List   Diagnosis Date Noted   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   Ocular pain, right eye-  actually periorbital 01/23/2018   Exertional epigastric/chest pain 05/03/2017   H/O: substance abuse ([place]) 05/03/2017   Nausea and vomiting 04/09/2017   Elevated triglycerides with high cholesterol 03/22/2017   Low serum HDL 03/22/2017   Myalgia 03/22/2017   Obesity, Class I, BMI 30-34.9 02/22/2017   Drug abuse, smokes marijuana qd 02/22/2017   Chronic heartburn 02/22/2017   Epigastric abdominal pain 02/22/2017   h/o Seizures ([place]) 04/26/2016   History of many Head concussions 04/26/2016   h/o Chronic neck pain 04/26/2016   History of shingles 04/26/2016   Neuralgia, postherpetic- R L Ext 04/26/2016   Edema of right lower extremity 04/26/2016    h/o Weakness of left arm 07/14/2013   h/o Paresthesia of left arm 07/14/2013   h/o Neck pain on left side 03/17/2013   h/o Pain in joint, R shoulder region 03/17/2013   Acute confusional state 12/18/2012   h/o TOBACCO USER 05/10/2009   h/o Migraine 02/15/2009   h/o chronic LOW BACK PAIN 07/10/2008   Adjustment disorder with mixed anxiety and depressed mood 07/04/2007    Past Surgical History:  Procedure Laterality Date   BACK SURGERY     KNEE SURGERY     TUMOR REMOVAL Bilateral 1980   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 Authorizing Provider  doxycycline (VIBRAMYCIN) 100 MG capsule Take 1 capsule (100 mg total) by mouth 2 (two) times daily. 03/28/22  Yes Judianne Seiple, Hildred Alamin E, FNP  ondansetron (ZOFRAN-ODT) 4 MG disintegrating tablet Take 1 tablet (4 mg total) by mouth every 8 (eight) hours as needed for nausea or vomiting. 03/28/22  Yes Oswaldo Conroy E, FNP  ibuprofen (ADVIL) 200 MG tablet Take 800 mg by mouth every 8 (eight) hours as needed for moderate pain.    [provider]  omeprazole (PRILOSEC OTC) 20 MG tablet Take 20 mg by  mouth daily.    [provider]    Family History Family History  Problem Relation Age of Onset   Diabetes Father    Diabetes Maternal Grandmother    Heart disease Maternal Grandmother        CHF   Heart attack Maternal Grandmother    Crohn's disease Brother    Colon cancer Neg Hx    Colon polyps Neg Hx    Esophageal cancer Neg Hx    Stomach cancer Neg Hx    Rectal cancer Neg Hx     Social History Social History   Tobacco Use   Smoking status: Every Day    Packs/day: 1.00    Years: 26.00    Total pack years: 26.00    Types: Cigarettes    Start date: 1995   Smokeless tobacco: Former  Scientific laboratory technician Use: Never used  Substance Use Topics   Alcohol use: No    Comment: rare   Drug use: Yes    Frequency:  2.0 times per week    Types: Marijuana     Allergies   Patient has no known allergies.   Review of Systems Review of Systems Per HPI  Physical Exam Triage Vital Signs ED Triage Vitals  Enc Vitals Group     BP 03/28/22 0842 (!) 161/98     Pulse Rate 03/28/22 0842 66     Resp 03/28/22 0842 18     Temp 03/28/22 0842 98.2 F (36.8 C)     Temp Source 03/28/22 0842 Oral     SpO2 03/28/22 0842 98 %     Weight --      Height --      Head Circumference --      Peak Flow --      Pain Score 03/28/22 0843 5     Pain Loc --      Pain Edu? --      Excl. in [place]? --    No data found.  Updated Vital Signs BP (!) 161/98 (BP Location: Left Arm)   Pulse 66   Temp 98.2 F (36.8 C) (Oral)   Resp 18   SpO2 98%   Visual Acuity Right Eye Distance:   Left Eye Distance:   Bilateral Distance:    Right Eye Near:   Left Eye Near:    Bilateral Near:     Physical Exam Constitutional:      General: He is not in acute distress.    Appearance: Normal appearance. He is not toxic-appearing or diaphoretic.  HENT:     Head: Normocephalic and atraumatic.     Right Ear: Tympanic membrane and ear canal normal.     Left Ear: Tympanic membrane and ear canal normal.     Nose: Nose normal.     Mouth/Throat:     Mouth: Mucous membranes are moist.     Pharynx: No posterior oropharyngeal erythema.  Eyes:     Extraocular Movements: Extraocular movements intact.     Conjunctiva/sclera: Conjunctivae normal.  Cardiovascular:     Rate and Rhythm: Normal rate and regular rhythm.     Pulses: Normal pulses.     Heart sounds: Normal heart sounds.  Pulmonary:     Effort: Pulmonary effort is normal. No respiratory distress.     Breath sounds: Normal breath sounds.  Abdominal:     General: Bowel sounds are normal. There is no distension.     Palpations: Abdomen is soft.  Tenderness: There is no abdominal tenderness.  Skin:    General: Skin is warm and dry.          Comments: Small,  erythematous circular flat areas that are approximately 1 cm in diameter present to right upper thigh and right lower leg.  No drainage noted.  Neurological:     General: No focal deficit present.     Mental Status: He is alert and oriented to person, place, and time. Mental status is at baseline.  Psychiatric:        Mood and Affect: Mood normal.        Behavior: Behavior normal.        Thought Content: Thought content normal.        Judgment: Judgment normal.      UC Treatments / Results  Labs (all labs ordered are listed, but only abnormal results are displayed) Labs Reviewed  CBC  COMPREHENSIVE METABOLIC PANEL  CK  LYME DISEASE SEROLOGY W/REFLEX    EKG   Radiology No results found.  Procedures Procedures (including critical care time)  Medications Ordered in UC Medications - No data to display  Initial Impression / Assessment and Plan / UC Course  I have reviewed the triage vital signs and the nursing notes.  Pertinent labs & imaging results that were available during my care of the patient were reviewed by me and considered in my medical decision making (see chart for details).     Differential diagnoses include tickborne illness versus viral gastroenteritis.  Will treat for both.  Doxycycline prescribed for patient.  Ondansetron also prescribed for patient to take as needed for nausea and vomiting.  No current signs of dehydration on exam.  Patient advised to ensure adequate fluid hydration.  Do not think that patient is in need of emergent evaluation at the hospital.  Will obtain blood work with CMP, CBC, CK, Lyme disease serology. Tick bites do not appear to be infected.  Discussed return and ER precautions.  Patient verbalized understanding and was agreeable with plan. Final Clinical Impressions(s) / UC Diagnoses   Final diagnoses:  Nausea vomiting and diarrhea  Tick bite, unspecified site, initial encounter     Discharge Instructions      As we  discussed, it is possible that you could have a viral illness or your symptoms could be related to tick bite.  You are being treated for both.  Doxycycline antibiotic has been prescribed to help treat possible tickborne illness.  You have also been prescribed a nausea medication to take as needed.  Please ensure adequate fluid hydration with clear fluids.  Please go to the hospital if symptoms persist or worsen.  Blood work is pending.  We will call if it is abnormal.    ED Prescriptions     Medication Sig Dispense Auth. Provider   doxycycline (VIBRAMYCIN) 100 MG capsule Take 1 capsule (100 mg total) by mouth 2 (two) times daily. 20 capsule [place], [place], [place]   ondansetron (ZOFRAN-ODT) 4 MG disintegrating tablet Take 1 tablet (4 mg total) by mouth every 8 (eight) hours as needed for nausea or vomiting. 20 tablet [place], Michele Rockers, [place]      PDMP not reviewed this encounter.   Teodora Medici, [place] 03/28/22 0912    Teodora Medici, FNP 03/28/22 [employer], [place], [place] 03/28/22 [phone]

## 2022-03-29 LAB — CBC
Hematocrit: 47.3 % (ref 37.5–51.0)
Hemoglobin: 16.6 g/dL (ref 13.0–17.7)
MCH: 32.2 pg (ref 26.6–33.0)
MCHC: 35.1 g/dL (ref 31.5–35.7)
MCV: 92 fL (ref 79–97)
Platelets: 252 10*3/uL (ref 150–450)
RBC: 5.15 x10E6/uL (ref 4.14–5.80)
RDW: 13.2 % (ref 11.6–15.4)
WBC: 7.3 10*3/uL (ref 3.4–10.8)

## 2022-03-29 LAB — COMPREHENSIVE METABOLIC PANEL
ALT: 26 IU/L (ref 0–44)
AST: 24 IU/L (ref 0–40)
Albumin/Globulin Ratio: 1.8 (ref 1.2–2.2)
Albumin: 4.3 g/dL (ref 4.1–5.1)
Alkaline Phosphatase: 59 IU/L (ref 44–121)
BUN/Creatinine Ratio: 18 (ref 9–20)
BUN: 14 mg/dL (ref 6–24)
Bilirubin Total: 0.6 mg/dL (ref 0.0–1.2)
CO2: 21 mmol/L (ref 20–29)
Calcium: 9 mg/dL (ref 8.7–10.2)
Chloride: 103 mmol/L (ref 96–106)
Creatinine, Ser: 0.8 mg/dL (ref 0.76–1.27)
Globulin, Total: 2.4 g/dL (ref 1.5–4.5)
Glucose: 103 mg/dL — ABNORMAL HIGH (ref 70–99)
Potassium: 4.4 mmol/L (ref 3.5–5.2)
Sodium: 139 mmol/L (ref 134–144)
Total Protein: 6.7 g/dL (ref 6.0–8.5)
eGFR: 113 mL/min/{1.73_m2} (ref >59)

## 2022-03-29 LAB — CK: Total CK: 282 U/L (ref 49–439)

## 2022-03-29 LAB — LYME DISEASE SEROLOGY W/REFLEX: Lyme Total Antibody EIA: NEGATIVE

## 2022-04-17 ENCOUNTER — Ambulatory Visit (INDEPENDENT_AMBULATORY_CARE_PROVIDER_SITE_OTHER): Payer: 59

## 2022-04-17 ENCOUNTER — Ambulatory Visit
Admission: EM | Admit: 2022-04-17 | Discharge: 2022-04-17 | Disposition: A | Discharge status: Home / Self Care | Payer: 59 | Attending: Physician Assistant | Admitting: Physician Assistant

## 2022-04-17 DIAGNOSIS — S8991XA Unspecified injury of right lower leg, initial encounter: Secondary | ICD-10-CM

## 2022-04-17 DIAGNOSIS — M25561 Pain in right knee: Secondary | ICD-10-CM

## 2022-04-17 NOTE — ED Triage Notes (Signed)
Pt presents with right knee injury after stepping into a hole this morning.

## 2022-04-17 NOTE — ED Provider Notes (Signed)
[facility]    CSN: [id number] Arrival date & time: 04/17/22  2263      History   Chief Complaint Chief Complaint  Patient presents with   Knee Injury    HPI Micheal Roth is a 46 y.o. male.   Patient here today for evaluation of right knee injury that occurred this morning when he accidentally stepped into a hole while walking his dog.  He reports that his knee twisted at the time of injury and he has had trouble straightening his knee completely and bearing weight since that time. Pain is present to joint diffusely, ,posterior knee. He has chronic issues with his knee that occurred since childhood.  He does not report treatment for symptoms.  He denies any new numbness or tingling.  The history is provided by the patient.    Past Medical History:  Diagnosis Date   Arthritis    Chronic back pain    Chronic left shoulder pain    Chronic neck pain    Depression    GERD (gastroesophageal reflux disease)    Migraines    Paresthesia of left arm and leg    and weakness of left arm and leg   Seizures ([place])     Patient Active Problem List   Diagnosis Date Noted   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   Ocular pain, right eye-  actually periorbital 01/23/2018   Exertional epigastric/chest pain 05/03/2017   H/O: substance abuse ([place]) 05/03/2017   Nausea and vomiting 04/09/2017   Elevated triglycerides with high cholesterol 03/22/2017   Low serum HDL 03/22/2017   Myalgia 03/22/2017   Obesity, Class I, BMI 30-34.9 02/22/2017   Drug abuse, smokes marijuana qd 02/22/2017   Chronic heartburn 02/22/2017   Epigastric abdominal pain 02/22/2017   h/o Seizures ([place]) 04/26/2016   History of many Head concussions 04/26/2016   h/o Chronic neck pain 04/26/2016   History of shingles 04/26/2016   Neuralgia, postherpetic- R L Ext 04/26/2016   Edema of right lower extremity 04/26/2016   h/o Weakness of left arm 07/14/2013   h/o Paresthesia of left arm 07/14/2013   h/o Neck  pain on left side 03/17/2013   h/o Pain in joint, R shoulder region 03/17/2013   Acute confusional state 12/18/2012   h/o TOBACCO USER 05/10/2009   h/o Migraine 02/15/2009   h/o chronic LOW BACK PAIN 07/10/2008   Adjustment disorder with mixed anxiety and depressed mood 07/04/2007    Past Surgical History:  Procedure Laterality Date   BACK SURGERY     KNEE SURGERY     TUMOR REMOVAL Bilateral 1980   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 Authorizing Provider  doxycycline (VIBRAMYCIN) 100 MG capsule Take 1 capsule (100 mg total) by mouth 2 (two) times daily. 03/28/22   Teodora Medici, FNP  ibuprofen (ADVIL) 200 MG tablet Take 800 mg by mouth every 8 (eight) hours as needed for moderate pain.    [provider]  omeprazole (PRILOSEC OTC) 20 MG tablet Take 20 mg by mouth daily.    [provider]  ondansetron (ZOFRAN-ODT) 4 MG disintegrating tablet Take 1 tablet (4 mg total) by mouth every 8 (eight) hours as needed for nausea or vomiting. 03/28/22   Teodora Medici, FNP    Family History Family History  Problem Relation Age of Onset  Diabetes Father    Diabetes Maternal Grandmother    Heart disease Maternal Grandmother        CHF   Heart attack Maternal Grandmother    Crohn's disease Brother    Colon cancer Neg Hx    Colon polyps Neg Hx    Esophageal cancer Neg Hx    Stomach cancer Neg Hx    Rectal cancer Neg Hx     Social History Social History   Tobacco Use   Smoking status: Every Day    Packs/day: 1.00    Years: 26.00    Total pack years: 26.00    Types: Cigarettes    Start date: 1995   Smokeless tobacco: Former  Scientific laboratory technician Use: Never used  Substance Use Topics   Alcohol use: No    Comment: rare   Drug use: Yes    Frequency: 2.0 times per week    Types: Marijuana     Allergies   Patient has no known  allergies.   Review of Systems Review of Systems  Constitutional:  Negative for chills and fever.  Eyes:  Negative for discharge and redness.  Musculoskeletal:  Positive for arthralgias and joint swelling.  Skin:  Negative for color change.  Neurological:  Positive for numbness (at baseline, none new).     Physical Exam Triage Vital Signs ED Triage Vitals  Enc Vitals Group     BP      Pulse      Resp      Temp      Temp src      SpO2      Weight      Height      Head Circumference      Peak Flow      Pain Score      Pain Loc      Pain Edu?      Excl. in [place]?    No data found.  Updated Vital Signs BP (!) 144/82 (BP Location: Left Arm)   Pulse 66   Temp 98.1 F (36.7 C) (Oral)   Resp 18   SpO2 97%      Physical Exam Vitals and nursing note reviewed.  Constitutional:      General: He is not in acute distress.    Appearance: Normal appearance. He is not ill-appearing.  HENT:     Head: Normocephalic and atraumatic.  Eyes:     Conjunctiva/sclera: Conjunctivae normal.  Cardiovascular:     Rate and Rhythm: Normal rate.  Pulmonary:     Effort: Pulmonary effort is normal.  Musculoskeletal:        General: Signs of injury present.     Comments: Mild diffuse swelling to right knee, decreased flexion and extension   Neurological:     Mental Status: He is alert.  Psychiatric:        Mood and Affect: Mood normal.        Behavior: Behavior normal.        Thought Content: Thought content normal.      UC Treatments / Results  Labs (all labs ordered are listed, but only abnormal results are displayed) Labs Reviewed - No data to display  EKG   Radiology DG Knee Complete 4 Views Right  Result Date: 04/17/2022 CLINICAL DATA:  Knee injury EXAM: RIGHT KNEE - COMPLETE 4 VIEW COMPARISON:  Knee radiograph dated June 13, 2021 FINDINGS: No fracture or dislocation. Small joint effusion. No mild tricompartmental osteoarthritis, similar  to prior exam. Multiple loose  bodies again seen. Soft tissues are unremarkable. IMPRESSION: 1. No acute osseous abnormality. 2. Small joint effusion. Electronically Signed   By: Yetta Glassman M.D.   On: 04/17/2022 10:13    Procedures Procedures (including critical care time)  Medications Ordered in UC Medications - No data to display  Initial Impression / Assessment and Plan / UC Course  I have reviewed the triage vital signs and the nursing notes.  Pertinent labs & imaging results that were available during my care of the patient were reviewed by me and considered in my medical decision making (see chart for details).   Recommended further evaluation by Ortho today given concerns for soft tissue injury.  Brace provided in office and crutches for assistance given pain with weightbearing.    Final Clinical Impressions(s) / UC Diagnoses   Final diagnoses:  Injury of right knee, initial encounter   Discharge Instructions   None    ED Prescriptions   None    PDMP not reviewed this encounter.   Francene Finders, PA-C 04/17/22 1039

## 2022-04-24 DIAGNOSIS — M2341 Loose body in knee, right knee: Secondary | ICD-10-CM | POA: Insufficient documentation

## 2022-04-24 DIAGNOSIS — M25461 Effusion, right knee: Secondary | ICD-10-CM | POA: Insufficient documentation

## 2022-05-05 DIAGNOSIS — S83289A Other tear of lateral meniscus, current injury, unspecified knee, initial encounter: Secondary | ICD-10-CM | POA: Insufficient documentation

## 2022-05-11 ENCOUNTER — Encounter (HOSPITAL_COMMUNITY): Payer: Self-pay | Admitting: Orthopedic Surgery

## 2022-05-11 DIAGNOSIS — Z4789 Encounter for other orthopedic aftercare: Secondary | ICD-10-CM | POA: Insufficient documentation

## 2022-05-11 NOTE — Patient Instructions (Signed)
SURGICAL WAITING ROOM VISITATION Patients having surgery or a procedure may have no more than 2 support people in the waiting area - these visitors may rotate.   Children under the age of 61 must have an adult with them who is not the patient. If the patient needs to stay at the hospital during part of their recovery, the visitor guidelines for inpatient rooms apply. Pre-op nurse will coordinate an appropriate time for 1 support person to accompany patient in pre-op.  This support person may not rotate.    Please refer to the [facility] website for the visitor guidelines for Inpatients (after your surgery is over and you are in a regular room).       Your procedure is scheduled on: Thursday, Sept. 14, 2023   Report to [facility] Main Entrance    Report to admitting at 9:30 AM   Call this number if you have problems the morning of surgery [phone]   Do not eat food :After Midnight.   After Midnight you may have the following liquids until 8:30 AM DAY OF SURGERY  Water Non-Citrus Juices (without pulp, NO RED) Carbonated Beverages Black Coffee (NO MILK/CREAM OR CREAMERS, sugar ok)  Clear Tea (NO MILK/CREAM OR CREAMERS, sugar ok) regular and decaf                             Plain Jell-O (NO RED)                                           Fruit ices (not with fruit pulp, NO RED)                                     Popsicles (NO RED)                                                               Sports drinks like Gatorade (NO RED)                   The day of surgery:  Drink ONE (1) Pre-Surgery Clear Ensure at 8:30 AM the morning of surgery. Drink in one sitting. Do not sip.  This drink was given to you during your hospital  pre-op appointment visit. Nothing else to drink after completing the  Pre-Surgery Clear Ensure.          If you have questions, please contact your surgeon's office.   FOLLOW BOWEL PREP AND ANY ADDITIONAL PRE OP INSTRUCTIONS YOU RECEIVED FROM  YOUR SURGEON'S OFFICE!!!     Oral Hygiene is also important to reduce your risk of infection.                                    Remember - BRUSH YOUR TEETH THE MORNING OF SURGERY WITH YOUR REGULAR TOOTHPASTE   Do NOT smoke after Midnight   Take these medicines the morning of surgery with A SIP OF WATER: Omeprazole  You may not have any metal on your body including jewelry, and body piercing             Do not wear lotions, powders, perfumes/cologne, or deodorant               Men may shave face and neck.   Do not bring valuables to the hospital. [place].   Contacts, dentures or bridgework may not be worn into surgery.   DO NOT [place]. PHARMACY WILL DISPENSE MEDICATIONS LISTED ON YOUR MEDICATION LIST TO YOU DURING YOUR ADMISSION [place]!    Patients discharged on the day of surgery will not be allowed to drive home.  Someone NEEDS to stay with you for the first 24 hours after anesthesia.   Special Instructions: Bring a copy of your healthcare power of attorney and living will documents         the day of surgery if you haven't scanned them before.              Please read over the following fact sheets you were given: IF YOU HAVE QUESTIONS ABOUT YOUR PRE-OP INSTRUCTIONS PLEASE CALL [phone]       [facility] - Preparing for Surgery Before surgery, you can play an important role.  Because skin is not sterile, your skin needs to be as free of germs as possible.  You can reduce the number of germs on your skin by washing with CHG (chlorahexidine gluconate) soap before surgery.  CHG is an antiseptic cleaner which kills germs and bonds with the skin to continue killing germs even after washing. Please DO NOT use if you have an allergy to CHG or antibacterial soaps.  If your skin becomes reddened/irritated stop using the CHG and inform your nurse when you arrive  at Short Stay. Do not shave (including legs and underarms) for at least 48 hours prior to the first CHG shower.  You may shave your face/neck.  Please follow these instructions carefully:  1.  Shower with CHG Soap the night before surgery and the  morning of surgery.  2.  If you choose to wash your hair, wash your hair first as usual with your normal  shampoo.  3.  After you shampoo, rinse your hair and body thoroughly to remove the shampoo.                             4.  Use CHG as you would any other liquid soap.  You can apply chg directly to the skin and wash.  Gently with a scrungie or clean washcloth.  5.  Apply the CHG Soap to your body ONLY FROM THE NECK DOWN.   Do   not use on face/ open                           Wound or open sores. Avoid contact with eyes, ears mouth and   genitals (private parts).                       Wash face,  Genitals (private parts) with your normal soap.             6.  Wash thoroughly, paying special attention to the area  where your    surgery  will be performed.  7.  Thoroughly rinse your body with warm water from the neck down.  8.  DO NOT shower/wash with your normal soap after using and rinsing off the CHG Soap.                9.  Pat yourself dry with a clean towel.            10.  Wear clean pajamas.            11.  Place clean sheets on your bed the night of your first shower and do not  sleep with pets. Day of Surgery : Do not apply any lotions/deodorants the morning of surgery.  Please wear clean clothes to the hospital/surgery center.  FAILURE TO FOLLOW THESE INSTRUCTIONS MAY RESULT IN THE CANCELLATION OF YOUR SURGERY  PATIENT SIGNATURE_________________________________  NURSE SIGNATURE__________________________________  ________________________________________________________________________

## 2022-05-11 NOTE — Progress Notes (Signed)
For Short Stay: [place] appointment date: Date of COVID positive in last 5 days:  Bowel Prep reminder:   For Anesthesia: PCP - Brunetta Jeans, PA-C last office visit note Cardiologist -   Chest x-ray - greater than 1 year EKG - greater than 1 year 02/14/17 in epic Stress Test - greater than 2 years 06/05/17 in epic ECHO -  greater than 2 years 06/05/17 in epic Cardiac Cath -  Pacemaker/ICD device last checked: Pacemaker orders received: Device Rep notified:  Spinal Cord Stimulator:  Sleep Study -  CPAP -   Fasting Blood Sugar -  Checks Blood Sugar _____ times a day Date and result of last Hgb A1c-  Blood Thinner Instructions: Aspirin Instructions: Last Dose:  Activity level: Can go up a flight of stairs and activities of daily living without stopping and without chest pain and/or shortness of breath   Able to exercise without chest pain and/or shortness of breath   Unable to go up a flight of stairs without chest pain and/or shortness of breath     Anesthesia review:   Patient denies shortness of breath, fever, cough and chest pain at PAT appointment   Patient verbalized understanding of instructions that were given to them at the PAT appointment. Patient was also instructed that they will need to review over the PAT instructions again at home before surgery.

## 2022-05-15 ENCOUNTER — Other Ambulatory Visit: Payer: Self-pay

## 2022-05-15 ENCOUNTER — Encounter (HOSPITAL_COMMUNITY): Payer: Self-pay

## 2022-05-15 ENCOUNTER — Encounter (HOSPITAL_COMMUNITY)
Admission: RE | Admit: 2022-05-15 | Discharge: 2022-05-15 | Disposition: A | Discharge status: Home / Self Care | Payer: 59 | Source: Ambulatory Visit | Attending: Orthopedic Surgery | Admitting: Orthopedic Surgery

## 2022-05-15 DIAGNOSIS — S83281A Other tear of lateral meniscus, current injury, right knee, initial encounter: Secondary | ICD-10-CM | POA: Insufficient documentation

## 2022-05-15 DIAGNOSIS — Z01818 Encounter for other preprocedural examination: Secondary | ICD-10-CM | POA: Diagnosis present

## 2022-05-15 LAB — CBC
HCT: 42.9 % (ref 39.0–52.0)
Hemoglobin: 15.3 g/dL (ref 13.0–17.0)
MCH: 32.6 pg (ref 26.0–34.0)
MCHC: 35.7 g/dL (ref 30.0–36.0)
MCV: 91.3 fL (ref 80.0–100.0)
Platelets: 219 10*3/uL (ref 150–400)
RBC: 4.7 MIL/uL (ref 4.22–5.81)
RDW: 12.5 % (ref 11.5–15.5)
WBC: 9.1 10*3/uL (ref 4.0–10.5)
nRBC: 0 % (ref 0.0–0.2)

## 2022-05-15 NOTE — Patient Instructions (Addendum)
SURGICAL WAITING ROOM VISITATION Patients having surgery or a procedure may have no more than 2 support people in the waiting area - these visitors may rotate.   Children under the age of 4 must have an adult with them who is not the patient. If the patient needs to stay at the hospital during part of their recovery, the visitor guidelines for inpatient rooms apply. Pre-op nurse will coordinate an appropriate time for 1 support person to accompany patient in pre-op.  This support person may not rotate.    Please refer to the [facility] website for the visitor guidelines for Inpatients (after your surgery is over and you are in a regular room).       Your procedure is scheduled on: Thursday, Sept. 14, 2023   Report to [facility] Main Entrance    Report to admitting at 9:30 AM   Call this number if you have problems the morning of surgery [phone]   Do not eat food :After Midnight.   After Midnight you may have the following liquids until 8:30 AM DAY OF SURGERY  Water Non-Citrus Juices (without pulp, NO RED) Carbonated Beverages Black Coffee (NO MILK/CREAM OR CREAMERS, sugar ok)  Clear Tea (NO MILK/CREAM OR CREAMERS, sugar ok) regular and decaf                             Plain Jell-O (NO RED)                                           Fruit ices (not with fruit pulp, NO RED)                                     Popsicles (NO RED)                                                               Sports drinks like Gatorade (NO RED)                   The day of surgery:  Drink ONE (1) Pre-Surgery Clear Ensure at 8:30 AM the morning of surgery. Drink in one sitting. Do not sip.  This drink was given to you during your hospital  pre-op appointment visit. Nothing else to drink after completing the  Pre-Surgery Clear Ensure.          If you have questions, please contact your surgeon's office.   FOLLOW ANY ADDITIONAL PRE OP INSTRUCTIONS YOU RECEIVED FROM YOUR SURGEON'S  OFFICE!!!     Oral Hygiene is also important to reduce your risk of infection.                                    Remember - BRUSH YOUR TEETH THE MORNING OF SURGERY WITH YOUR REGULAR TOOTHPASTE   Do NOT smoke after Midnight   Take these medicines the morning of surgery with A SIP OF WATER: Omeprazole  You may not have any metal on your body including jewelry, and body piercing             Do not wear lotions, powders, perfumes/cologne, or deodorant               Men may shave face and neck.   Do not bring valuables to the hospital. [place].   Contacts, dentures or bridgework may not be worn into surgery.   DO NOT [place]. PHARMACY WILL DISPENSE MEDICATIONS LISTED ON YOUR MEDICATION LIST TO YOU DURING YOUR ADMISSION [place]!    Patients discharged on the day of surgery will not be allowed to drive home.  Someone NEEDS to stay with you for the first 24 hours after anesthesia.                Please read over the following fact sheets you were given: IF YOU HAVE QUESTIONS ABOUT YOUR PRE-OP INSTRUCTIONS PLEASE CALL [phone]       [facility] - Preparing for Surgery Before surgery, you can play an important role.  Because skin is not sterile, your skin needs to be as free of germs as possible.  You can reduce the number of germs on your skin by washing with CHG (chlorahexidine gluconate) soap before surgery.  CHG is an antiseptic cleaner which kills germs and bonds with the skin to continue killing germs even after washing. Please DO NOT use if you have an allergy to CHG or antibacterial soaps.  If your skin becomes reddened/irritated stop using the CHG and inform your nurse when you arrive at Short Stay. Do not shave (including legs and underarms) for at least 48 hours prior to the first CHG shower.  You may shave your face/neck.  Please follow these  instructions carefully:  1.  Shower with CHG Soap the night before surgery and the  morning of surgery.  2.  If you choose to wash your hair, wash your hair first as usual with your normal  shampoo.  3.  After you shampoo, rinse your hair and body thoroughly to remove the shampoo.                             4.  Use CHG as you would any other liquid soap.  You can apply chg directly to the skin and wash.  Gently with a scrungie or clean washcloth.  5.  Apply the CHG Soap to your body ONLY FROM THE NECK DOWN.   Do   not use on face/ open                           Wound or open sores. Avoid contact with eyes, ears mouth and   genitals (private parts).                       Wash face,  Genitals (private parts) with your normal soap.             6.  Wash thoroughly, paying special attention to the area where your    surgery  will be performed.  7.  Thoroughly rinse your body with warm water from the neck down.  8.  DO NOT shower/wash with your normal  soap after using and rinsing off the CHG Soap.                9.  Pat yourself dry with a clean towel.            10.  Wear clean pajamas.            11.  Place clean sheets on your bed the night of your first shower and do not  sleep with pets. Day of Surgery : Do not apply any lotions/deodorants the morning of surgery.  Please wear clean clothes to the hospital/surgery center.  FAILURE TO FOLLOW THESE INSTRUCTIONS MAY RESULT IN THE CANCELLATION OF YOUR SURGERY  PATIENT SIGNATURE_________________________________  NURSE SIGNATURE__________________________________  ________________________________________________________________________    Micheal Roth  An incentive spirometer is a tool that can help keep your lungs clear and active. This tool measures how well you are filling your lungs with each breath. Taking long deep breaths may help reverse or decrease the chance of developing breathing (pulmonary) problems (especially infection)  following: A long period of time when you are unable to move or be active. BEFORE THE PROCEDURE  If the spirometer includes an indicator to show your best effort, your nurse or respiratory therapist will set it to a desired goal. If possible, sit up straight or lean slightly forward. Try not to slouch. Hold the incentive spirometer in an upright position. INSTRUCTIONS FOR USE  Sit on the edge of your bed if possible, or sit up as far as you can in bed or on a chair. Hold the incentive spirometer in an upright position. Breathe out normally. Place the mouthpiece in your mouth and seal your lips tightly around it. Breathe in slowly and as deeply as possible, raising the piston or the ball toward the top of the column. Hold your breath for 3-5 seconds or for as long as possible. Allow the piston or ball to fall to the bottom of the column. Remove the mouthpiece from your mouth and breathe out normally. Rest for a few seconds and repeat Steps 1 through 7 at least 10 times every 1-2 hours when you are awake. Take your time and take a few normal breaths between deep breaths. The spirometer may include an indicator to show your best effort. Use the indicator as a goal to work toward during each repetition. After each set of 10 deep breaths, practice coughing to be sure your lungs are clear. If you have an incision (the cut made at the time of surgery), support your incision when coughing by placing a pillow or rolled up towels firmly against it. Once you are able to get out of bed, walk around indoors and cough well. You may stop using the incentive spirometer when instructed by your caregiver.  RISKS AND COMPLICATIONS Take your time so you do not get dizzy or light-headed. If you are in pain, you may need to take or ask for pain medication before doing incentive spirometry. It is harder to take a deep breath if you are having pain. AFTER USE Rest and breathe slowly and easily. It can be helpful to  keep track of a log of your progress. Your caregiver can provide you with a simple table to help with this. If you are using the spirometer at home, follow these instructions: [place] IF:  You are having difficultly using the spirometer. You have trouble using the spirometer as often as instructed. Your pain medication is not giving enough relief while  using the spirometer. You develop fever of 100.5 F (38.1 C) or higher. SEEK IMMEDIATE MEDICAL CARE IF:  You cough up bloody sputum that had not been present before. You develop fever of 102 F (38.9 C) or greater. You develop worsening pain at or near the incision site. MAKE SURE YOU:  Understand these instructions. Will watch your condition. Will get help right away if you are not doing well or get worse. Document Released: 01/01/2007 Document Revised: 11/13/2011 Document Reviewed: 03/04/2007 [facility] Patient Information 2014 [place], [place].   ________________________________________________________________________

## 2022-05-15 NOTE — Progress Notes (Signed)
For Short Stay: [place] appointment date: Date of COVID positive in last 20 days:  Bowel Prep reminder:   For Anesthesia: PCP - Brunetta Jeans, PA-C last office visit note  goes to urgent care Cardiologist - no  Chest x-ray - greater than 1 year EKG - greater than 1 year 02/14/17 in epic Stress Test - greater than 2 years 06/05/17 in epic ECHO -  greater than 2 years 06/05/17 in epic Cardiac Cath -  Pacemaker/ICD device last checked: Pacemaker orders received: Device Rep notified:  Spinal Cord Stimulator:  Sleep Study -  CPAP -   Fasting Blood Sugar -  Checks Blood Sugar _____ times a day Date and result of last Hgb A1c-  Blood Thinner Instructions: Aspirin Instructions: Last Dose:  Activity level: Can go up a flight of stairs and activities of daily living without stopping and without chest pain and/or shortness of breath   Anesthesia review:   Patient denies shortness of breath, fever, cough and chest pain at PAT appointment   Patient verbalized understanding of instructions that were given to them at the PAT appointment. Patient was also instructed that they will need to review over the PAT instructions again at home before surgery.

## 2022-05-18 ENCOUNTER — Ambulatory Visit (HOSPITAL_COMMUNITY)
Admission: RE | Admit: 2022-05-18 | Discharge: 2022-05-18 | Disposition: A | Discharge status: Home / Self Care | Payer: 59 | Attending: Orthopedic Surgery | Admitting: Orthopedic Surgery

## 2022-05-18 ENCOUNTER — Ambulatory Visit (HOSPITAL_BASED_OUTPATIENT_CLINIC_OR_DEPARTMENT_OTHER): Payer: 59 | Admitting: Anesthesiology

## 2022-05-18 ENCOUNTER — Encounter (HOSPITAL_COMMUNITY)
Admission: RE | Disposition: A | Discharge status: Home / Self Care | Payer: Self-pay | Source: Home / Self Care | Attending: Orthopedic Surgery

## 2022-05-18 ENCOUNTER — Encounter (HOSPITAL_COMMUNITY): Payer: Self-pay | Admitting: Orthopedic Surgery

## 2022-05-18 ENCOUNTER — Ambulatory Visit (HOSPITAL_COMMUNITY): Payer: 59 | Admitting: Anesthesiology

## 2022-05-18 DIAGNOSIS — S83281A Other tear of lateral meniscus, current injury, right knee, initial encounter: Secondary | ICD-10-CM | POA: Diagnosis not present

## 2022-05-18 DIAGNOSIS — M659 Synovitis and tenosynovitis, unspecified: Secondary | ICD-10-CM | POA: Insufficient documentation

## 2022-05-18 DIAGNOSIS — Z01818 Encounter for other preprocedural examination: Secondary | ICD-10-CM

## 2022-05-18 DIAGNOSIS — M94261 Chondromalacia, right knee: Secondary | ICD-10-CM | POA: Diagnosis not present

## 2022-05-18 DIAGNOSIS — F17290 Nicotine dependence, other tobacco product, uncomplicated: Secondary | ICD-10-CM | POA: Diagnosis not present

## 2022-05-18 DIAGNOSIS — K219 Gastro-esophageal reflux disease without esophagitis: Secondary | ICD-10-CM | POA: Diagnosis not present

## 2022-05-18 DIAGNOSIS — M1990 Unspecified osteoarthritis, unspecified site: Secondary | ICD-10-CM | POA: Insufficient documentation

## 2022-05-18 DIAGNOSIS — W19XXXA Unspecified fall, initial encounter: Secondary | ICD-10-CM | POA: Diagnosis not present

## 2022-05-18 HISTORY — DX: Personal history of other infectious and parasitic diseases: Z86.19

## 2022-05-18 HISTORY — PX: KNEE ARTHROSCOPY WITH LATERAL MENISECTOMY: SHX6193

## 2022-05-18 SURGERY — ARTHROSCOPY, KNEE, WITH LATERAL MENISCECTOMY
Anesthesia: General | Site: Knee | Laterality: Right

## 2022-05-18 MED ORDER — KETOROLAC TROMETHAMINE 30 MG/ML IJ SOLN
INTRAMUSCULAR | Status: AC
  Filled 2022-05-18: qty 1

## 2022-05-18 MED ORDER — FENTANYL CITRATE (PF) 100 MCG/2ML IJ SOLN
INTRAMUSCULAR | Status: AC
  Filled 2022-05-18: qty 2

## 2022-05-18 MED ORDER — PROMETHAZINE HCL 25 MG/ML IJ SOLN: 6.2500 mg | INTRAMUSCULAR | Status: DC | PRN

## 2022-05-18 MED ORDER — ONDANSETRON HCL 4 MG/2ML IJ SOLN
INTRAMUSCULAR | Status: AC
  Filled 2022-05-18: qty 2

## 2022-05-18 MED ORDER — SODIUM CHLORIDE 0.9 % IR SOLN
Status: DC | PRN
  Administered 2022-05-18: 3000 mL

## 2022-05-18 MED ORDER — MIDAZOLAM HCL 2 MG/2ML IJ SOLN
INTRAMUSCULAR | Status: AC
  Filled 2022-05-18: qty 2

## 2022-05-18 MED ORDER — ONDANSETRON HCL 4 MG/2ML IJ SOLN
INTRAMUSCULAR | Status: DC | PRN
  Administered 2022-05-18: 4 mg via INTRAVENOUS

## 2022-05-18 MED ORDER — LACTATED RINGERS IV SOLN: INTRAVENOUS | Status: DC

## 2022-05-18 MED ORDER — OXYCODONE HCL 5 MG/5ML PO SOLN: 5.0000 mg | Freq: Once | ORAL | Status: DC | PRN

## 2022-05-18 MED ORDER — HYDROMORPHONE HCL 1 MG/ML IJ SOLN
INTRAMUSCULAR | Status: AC
  Filled 2022-05-18: qty 1

## 2022-05-18 MED ORDER — BUPIVACAINE HCL 0.25 % IJ SOLN
INTRAMUSCULAR | Status: DC | PRN
  Administered 2022-05-18: 15 mL

## 2022-05-18 MED ORDER — MIDAZOLAM HCL 2 MG/2ML IJ SOLN: 0.5000 mg | Freq: Once | INTRAMUSCULAR | Status: DC | PRN

## 2022-05-18 MED ORDER — HYDROMORPHONE HCL 1 MG/ML IJ SOLN
0.2500 mg | INTRAMUSCULAR | Status: DC | PRN
  Administered 2022-05-18 (×2): 0.5 mg via INTRAVENOUS

## 2022-05-18 MED ORDER — MIDAZOLAM HCL 2 MG/2ML IJ SOLN
INTRAMUSCULAR | Status: DC | PRN
  Administered 2022-05-18: 2 mg via INTRAVENOUS

## 2022-05-18 MED ORDER — MEPERIDINE HCL 50 MG/ML IJ SOLN: 6.2500 mg | INTRAMUSCULAR | Status: DC | PRN

## 2022-05-18 MED ORDER — DEXAMETHASONE SODIUM PHOSPHATE 10 MG/ML IJ SOLN
INTRAMUSCULAR | Status: DC | PRN
  Administered 2022-05-18: 10 mg via INTRAVENOUS

## 2022-05-18 MED ORDER — ONDANSETRON HCL 4 MG PO TABS: 4.0000 mg | ORAL_TABLET | Freq: Three times a day (TID) | ORAL | 0 refills | Status: DC | PRN

## 2022-05-18 MED ORDER — PROPOFOL 10 MG/ML IV BOLUS
INTRAVENOUS | Status: DC | PRN
  Administered 2022-05-18: 200 mg via INTRAVENOUS

## 2022-05-18 MED ORDER — DEXAMETHASONE SODIUM PHOSPHATE 10 MG/ML IJ SOLN
INTRAMUSCULAR | Status: AC
  Filled 2022-05-18: qty 1

## 2022-05-18 MED ORDER — HYDROCODONE-ACETAMINOPHEN 5-325 MG PO TABS: 1.0000 | ORAL_TABLET | ORAL | 0 refills | Status: DC | PRN

## 2022-05-18 MED ORDER — CEFAZOLIN SODIUM-DEXTROSE 2-4 GM/100ML-% IV SOLN
2.0000 g | INTRAVENOUS | Status: AC
  Administered 2022-05-18: 2 g via INTRAVENOUS
  Filled 2022-05-18: qty 100

## 2022-05-18 MED ORDER — LIDOCAINE 2% (20 MG/ML) 5 ML SYRINGE
INTRAMUSCULAR | Status: DC | PRN
  Administered 2022-05-18: 40 mg via INTRAVENOUS

## 2022-05-18 MED ORDER — MIDAZOLAM HCL 2 MG/2ML IJ SOLN
1.0000 mg | INTRAMUSCULAR | Status: DC
  Filled 2022-05-18: qty 2

## 2022-05-18 MED ORDER — KETOROLAC TROMETHAMINE 30 MG/ML IJ SOLN
INTRAMUSCULAR | Status: DC | PRN
  Administered 2022-05-18: 30 mg via INTRAVENOUS

## 2022-05-18 MED ORDER — FENTANYL CITRATE (PF) 100 MCG/2ML IJ SOLN
INTRAMUSCULAR | Status: DC | PRN
  Administered 2022-05-18 (×2): 50 ug via INTRAVENOUS
  Administered 2022-05-18: 100 ug via INTRAVENOUS

## 2022-05-18 MED ORDER — BUPIVACAINE HCL (PF) 0.25 % IJ SOLN
INTRAMUSCULAR | Status: AC
  Filled 2022-05-18: qty 30

## 2022-05-18 MED ORDER — OXYCODONE HCL 5 MG PO TABS: 5.0000 mg | ORAL_TABLET | Freq: Once | ORAL | Status: DC | PRN

## 2022-05-18 MED ORDER — CHLORHEXIDINE GLUCONATE 0.12 % MT SOLN
15.0000 mL | Freq: Once | OROMUCOSAL | Status: AC
  Administered 2022-05-18: 15 mL via OROMUCOSAL

## 2022-05-18 MED ORDER — LIDOCAINE HCL (PF) 2 % IJ SOLN
INTRAMUSCULAR | Status: AC
  Filled 2022-05-18: qty 5

## 2022-05-18 MED ORDER — ACETAMINOPHEN 500 MG PO TABS
1000.0000 mg | ORAL_TABLET | Freq: Once | ORAL | Status: AC
Start: 2022-05-18 — End: 2022-05-18
  Administered 2022-05-18: 1000 mg via ORAL
  Filled 2022-05-18: qty 2

## 2022-05-18 MED ORDER — FENTANYL CITRATE PF 50 MCG/ML IJ SOSY
50.0000 ug | PREFILLED_SYRINGE | INTRAMUSCULAR | Status: DC
  Filled 2022-05-18: qty 2

## 2022-05-18 MED ORDER — ORAL CARE MOUTH RINSE: 15.0000 mL | Freq: Once | OROMUCOSAL | Status: AC

## 2022-05-18 SURGICAL SUPPLY — 35 items
BAG COUNTER SPONGE SURGICOUNT (BAG) ×1 IMPLANT
BAG SPNG CNTER NS LX DISP (BAG) ×1
BANDAGE ACE 6X5 VEL STRL LF (GAUZE/BANDAGES/DRESSINGS) IMPLANT
BLADE SHAVER TORPEDO 4X13 (MISCELLANEOUS) ×1 IMPLANT
BNDG ELASTIC 6X5.8 VLCR STR LF (GAUZE/BANDAGES/DRESSINGS) ×1 IMPLANT
CAST PADDING STERILE 4X4 (CAST SUPPLIES) IMPLANT
COVER SURGICAL LIGHT HANDLE (MISCELLANEOUS) ×1 IMPLANT
CUFF TOURN SGL QUICK 34 (TOURNIQUET CUFF)
CUFF TRNQT CYL 34X4.125X (TOURNIQUET CUFF) IMPLANT
DRAPE ARTHROSCOPY W/POUCH 114 (DRAPES) ×1 IMPLANT
DRAPE SHEET LG 3/4 BI-LAMINATE (DRAPES) IMPLANT
DRAPE U-SHAPE 47X51 STRL (DRAPES) ×1 IMPLANT
DURAPREP 26ML APPLICATOR (WOUND CARE) ×1 IMPLANT
GAUZE 4X4 16PLY ~~LOC~~+RFID DBL (SPONGE) ×1 IMPLANT
GAUZE PAD ABD 8X10 STRL (GAUZE/BANDAGES/DRESSINGS) ×2 IMPLANT
GAUZE SPONGE 4X4 12PLY STRL (GAUZE/BANDAGES/DRESSINGS) ×1 IMPLANT
GLOVE BIO SURGEON STRL SZ7.5 (GLOVE) ×2 IMPLANT
GLOVE BIOGEL PI IND STRL 8 (GLOVE) ×2 IMPLANT
GOWN STRL REUS W/ TWL XL LVL3 (GOWN DISPOSABLE) ×2 IMPLANT
GOWN STRL REUS W/TWL XL LVL3 (GOWN DISPOSABLE) ×2
IV NS IRRIG 3000ML ARTHROMATIC (IV SOLUTION) ×2 IMPLANT
KIT BASIN OR (CUSTOM PROCEDURE TRAY) ×1 IMPLANT
KIT TURNOVER KIT A (KITS) IMPLANT
MANIFOLD NEPTUNE II (INSTRUMENTS) ×1 IMPLANT
PACK ARTHROSCOPY WL (CUSTOM PROCEDURE TRAY) ×1 IMPLANT
PORT APPOLLO RF 90DEGREE MULTI (SURGICAL WAND) IMPLANT
PROBE HOOK APOLLO (SURGICAL WAND) IMPLANT
STRIP CLOSURE SKIN 1/2X4 (GAUZE/BANDAGES/DRESSINGS) ×1 IMPLANT
SUT ETHILON 4 0 PS 2 18 (SUTURE) IMPLANT
SUT MNCRL AB 3-0 PS2 18 (SUTURE) ×1 IMPLANT
TOWEL OR 17X26 10 PK STRL BLUE (TOWEL DISPOSABLE) ×1 IMPLANT
TUBING ARTHROSCOPY IRRIG 16FT (MISCELLANEOUS) ×1 IMPLANT
WAND APOLLORF SJ50 AR-9845 (SURGICAL WAND) IMPLANT
WATER STERILE IRR 500ML POUR (IV SOLUTION) ×1 IMPLANT
WRAP KNEE MAXI GEL POST OP (GAUZE/BANDAGES/DRESSINGS) ×1 IMPLANT

## 2022-05-18 NOTE — Op Note (Signed)
Surgery Date: 05/18/2022  Surgeon(s): Nicholes Stairs, MD  Assistant: Jonelle Sidle, PA-C  Assistant attestation:  PA Mcclung present for the entire procedure.  ANESTHESIA:  general, and Local  FLUIDS: Per anesthesia record.   ESTIMATED BLOOD LOSS: minimal  PREOPERATIVE DIAGNOSES:  1. Right knee latera meniscus tear, initial encounter 2.  knee synovitis  POSTOPERATIVE DIAGNOSES:  1.  Right knee lateral meniscus tear, initial encounter 2.  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  Right knee synovitis   PROCEDURES PERFORMED:  1.  Right knee arthroscopy with limited synovectomy 2.  Right knee arthroscopy with arthroscopic chondroplasty medial and lateral femoral condyle and trochlea  DESCRIPTION OF PROCEDURE: Micheal Roth is a 46 y.o.-year-old male with right knee lateral meniscus tear. Plans are to proceed with partial lateral meniscectomy and diagnostic arthroscopy with debridement as indicated. Full discussion held regarding risks benefits alternatives and complications related surgical intervention. Conservative care options reviewed. All questions answered.  The patient was identified in the preoperative holding area and the operative extremity was marked. The patient was brought to the operating room and transferred to operating table in a supine position. Satisfactory general anesthesia was induced by anesthesiology.    Standard anterolateral, anteromedial arthroscopy portals were obtained. The anteromedial portal was obtained with a spinal needle for localization under direct visualization with subsequent diagnostic findings.   Anteromedial and anterolateral chambers: moderate synovitis. The synovitis was debrided with a 4.5 mm full radius shaver through both the anteromedial and lateral portals.   Suprapatellar pouch and gutters: mild synovitis or debris. Patella chondral surface: Grade 1 Trochlear chondral  surface: Grade 4 in the central trochlea with unstable flaps Patellofemoral tracking: Midline level Medial meniscus: Intact.  Medial femoral condyle flexion bearing surface: Grade 4, noted with unstable flaps Medial femoral condyle extension bearing surface: Grade 4 noted with unstable flaps Medial tibial plateau: Grade 1 Anterior cruciate ligament:stable Posterior cruciate ligament:stable Lateral meniscus: Mild fibrillations noted in the free edge of the mid body in the white zone no unstable tear.   Lateral femoral condyle flexion bearing surface: Grade 1 Lateral femoral condyle extension bearing surface: Grade 4 with flaps noted in the central weightbearing portion Lateral tibial plateau: Grade 0  We did not encounter any unstable tears of the meniscus.  There was some free edge fibrillations in the lateral meniscus but nothing of substance or structural significance.  We then addressed the multiple areas of grade 4 chondral flaps with combination of meniscal biter motorized shaver along the trochlea, medial femoral condyle, and lateral femoral condyle with motorized shaver and biters.  We smoothed these all to stable transition zones.  After completion of synovectomy, diagnostic exam, and debridements as described, all compartments were checked and no residual debris remained. Hemostasis was achieved with the cautery wand. The portals were approximated with buried monocryl. All excess fluid was expressed from the joint.  Xeroform sterile gauze dressings were applied followed by Ace bandage and ice pack.   DISPOSITION: The patient was awakened from general anesthetic, extubated, taken to the recovery room in medically stable condition, no apparent complications. The patient may be weightbearing as tolerated to the operative lower extremity.  Range of motion of right knee as tolerated.

## 2022-05-18 NOTE — Anesthesia Preprocedure Evaluation (Addendum)
Anesthesia Evaluation  Patient identified by MRN, date of birth, ID band Patient awake    Reviewed: Allergy & Precautions, NPO status , Patient's Chart, lab work & pertinent test results  History of Anesthesia Complications Negative for: history of anesthetic complications  Airway Mallampati: II  TM Distance: >3 FB Neck ROM: Full    Dental  (+) Poor Dentition, Missing, Dental Advisory Given   Pulmonary Current Smoker and Patient abstained from smoking.,    breath sounds clear to auscultation       Cardiovascular  Rhythm:Regular Rate:Normal  '18 stress ECHO: Left ventricular EF was normal at rest and with stress. There was no echocardiographic  evidence for stress-induced ischemia   Neuro/Psych  Headaches, Depression    GI/Hepatic GERD  Medicated,(+)     substance abuse  marijuana use,   Endo/Other  BMI 30  Renal/GU negative Renal ROS     Musculoskeletal  (+) Arthritis ,   Abdominal (+) + obese,   Peds  Hematology negative hematology ROS (+)   Anesthesia Other Findings   Reproductive/Obstetrics                            Anesthesia Physical Anesthesia Plan  ASA: 2  Anesthesia Plan: General   Post-op Pain Management: Tylenol PO (pre-op)*   Induction: Intravenous  PONV Risk Score and Plan: 2 and Ondansetron and Dexamethasone  Airway Management Planned: LMA  Additional Equipment: None  Intra-op Plan:   Post-operative Plan:   Informed Consent: I have reviewed the patients History and Physical, chart, labs and discussed the procedure including the risks, benefits and alternatives for the proposed anesthesia with the patient or authorized representative who has indicated his/her understanding and acceptance.     Dental advisory given  Plan Discussed with: CRNA and Surgeon  Anesthesia Plan Comments:        Anesthesia Quick Evaluation

## 2022-05-18 NOTE — H&P (Signed)
ORTHOPAEDIC H and P  REQUESTING PHYSICIAN: Nicholes Stairs, MD  PCP:  Patient, No Pcp Per  Chief Complaint: Right knee pain  HPI: Micheal Roth is a 46 y.o. male who complains of right knee pain, swelling, mechanical symptoms following a fall.  He has had a remote history of surgery but this is something that is certainly been aggravated by his recent injury.  Here today for arthroscopic surgery.  No new complaints.  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   Social History   Socioeconomic History   Marital status: Single    Spouse name: Not on file   Number of children: Not on file   Years of education: Not on file   Highest education level: Not on file  Occupational History   Not on file  Tobacco Use   Smoking status: Former    Packs/day: 1.00    Years: 26.00    Total pack years: 26.00    Types: Cigarettes    Start date: 57    Quit date: 01/02/2022    Years since quitting: 0.3   Smokeless tobacco: Former  Scientific laboratory technician Use: Every day   Substances: Nicotine  Substance and Sexual Activity   Alcohol use: No    Comment: rare   Drug use: Not Currently    Frequency: 2.0 times per week    Types: Marijuana   Sexual activity: Not Currently  Other Topics Concern   Not on file  Social History Narrative   Not on file   Social Determinants of Health   Financial Resource Strain: Not on file  Food Insecurity: Not on file  Transportation Needs: Not on file  Physical Activity: Not on file  Stress: Not  on file  Social Connections: Not on file   Family History  Problem Relation Age of Onset   Diabetes Father    Diabetes Maternal Grandmother    Heart disease Maternal Grandmother        CHF   Heart attack Maternal Grandmother    Crohn's disease Brother    Colon cancer Neg Hx    Colon polyps Neg Hx    Esophageal cancer Neg Hx    Stomach cancer Neg Hx    Rectal cancer Neg Hx    No Known Allergies Prior to Admission medications   Medication Sig Start Date End Date Taking? Authorizing Provider  ibuprofen (ADVIL) 200 MG tablet Take 400 mg by mouth in the morning.   Yes [provider]  omeprazole (PRILOSEC OTC) 20 MG tablet Take 20 mg by mouth daily before breakfast.   Yes [provider]  doxycycline (VIBRAMYCIN) 100 MG capsule Take 1 capsule (100 mg total) by mouth 2 (two) times daily. Patient not taking: Reported on 05/11/2022 03/28/22   Teodora Medici, FNP  ondansetron (ZOFRAN-ODT) 4 MG disintegrating tablet Take  1 tablet (4 mg total) by mouth every 8 (eight) hours as needed for nausea or vomiting. Patient not taking: Reported on 05/11/2022 03/28/22   Teodora Medici, FNP   No results found.  Positive ROS: All other systems have been reviewed and were otherwise negative with the exception of those mentioned in the HPI and as above.  Physical Exam: General: Alert, no acute distress Cardiovascular: No pedal edema Respiratory: No cyanosis, no use of accessory musculature GI: No organomegaly, abdomen is soft and non-tender Skin: No lesions in the area of chief complaint Neurologic: Sensation intact distally Psychiatric: Patient is competent for consent with normal mood and affect Lymphatic: No axillary or cervical lymphadenopathy  MUSCULOSKELETAL: Right lower extremity is warm and well-perfused without any open wounds or lesions.  Neurovascular intact.  Assessment: 1.  Right knee lateral meniscus tear, initial encounter  Plan: Plan to proceed today with  arthroscopic surgery on the right knee with partial lateral meniscectomy and debridements as indicated.  No new complaints for the patient.  We again discussed the risk of bleeding, infection, damage to surrounding nerves and vessels, stiffness, progression of disease, development of arthritis, risk of anesthesia as well as the risk of DVT.  He has provided informed consent.  -Plan will be to discharge home postoperatively from PACU.    Nicholes Stairs, MD Cell [phone]    05/18/2022 12:04 PM

## 2022-05-18 NOTE — Discharge Instructions (Signed)
Post-operative patient instructions  Knee Arthroscopy   Ice:  Place intermittent ice or cooler pack over your knee, 30 minutes on and 30 minutes off.  Continue this for the first 72 hours after surgery, then save ice for use after therapy sessions or on more active days.   Weight:  You may bear weight on your leg as your symptoms allow. DVT prevention: Perform ankle pumps as able throughout the day on the operative extremity.  Be mobile as possible with ambulation as able.  You should also take an 81 mg aspirin once per day x6 weeks. Crutches:  Use crutches (or walker) to assist in walking until told to discontinue by your physical therapist or physician. This will help to reduce pain. Strengthening:  Perform simple thigh squeezes (isometric quad contractions) and straight leg lifts as you are able (3 sets of 5 to 10 repetitions, 3 times a day).  For the leg lifts, have someone support under your ankle in the beginning until you have increased strength enough to do this on your own.  To help get started on thigh squeezes, place a pillow under your knee and push down on the pillow with back of knee (sometimes easier to do than with your leg fully straight). Motion:  Perform gentle knee motion as tolerated - this is gentle bending and straightening of the knee. Seated heel slides: you can start by sitting in a chair, remove your brace, and gently slide your heel back on the floor - allowing your knee to bend. Have someone help you straighten your knee (or use your other leg/foot hooked under your ankle.  Dressing:  Perform 1st dressing change at 3 days postoperative. A moderate amount of blood tinged drainage is to be expected.  So if you bleed through the dressing on the first or second day or if you have fevers, it is fine to change the dressing/check the wounds early and redress wound. Elevate your leg.  If it bleeds through again, or if the incisions are leaking frank blood, please call the office. May  change dressing every 1-2 days thereafter to help watch wounds. Can purchase Tegderm (or 14M Nexcare) water resistant dressings at local pharmacy / [employer]. Shower:  shower is ok after 3 days.  Please take shower, NO bath. Recover with gauze and ace wrap to help keep wounds protected.   Pain medication:  A narcotic pain medication has been prescribed.  Take as directed.  Typically you need narcotic pain medication more regularly during the first 3 to 5 days after surgery.  Decrease your use of the medication as the pain improves.  Narcotics can sometimes cause constipation, even after a few doses.  If you have problems with constipation, you can take an over the counter stool softener or light laxative.  If you have persistent problems, please notify your physician's office. Physical therapy: Additional activity guidelines to be provided by your physician or physical therapist at follow-up visits.  Driving: Do not recommend driving x 1-2 weeks post surgical, especially if surgery performed on right side. Should not drive while taking narcotic pain medications. It typically takes at least 2 weeks to restore sufficient neuromuscular function for normal reaction times for driving safety.  Call [phone] for questions or problems. Evenings you will be forwarded to the hospital operator.  Ask for the orthopaedic physician on call. Please call if you experience:    Redness, foul smelling, or persistent drainage from the surgical site  worsening knee pain and swelling  not responsive to medication  any calf pain and or swelling of the lower leg  temperatures greater than 101.5 F other questions or concerns   Thank you for allowing [place] to be a part of your care

## 2022-05-18 NOTE — Anesthesia Postprocedure Evaluation (Signed)
Anesthesia Post Note  Patient: Massimo A Kogler  Procedure(s) Performed: KNEE ARTHROSCOPY WITH CHONDROPLASTY  AND DEBRIDEMENT (Right: Knee)     Patient location during evaluation: PACU Anesthesia Type: General Level of consciousness: awake and alert, patient cooperative and oriented Pain management: pain level controlled (no Pain) Vital Signs Assessment: post-procedure vital signs reviewed and stable Respiratory status: spontaneous breathing, nonlabored ventilation and respiratory function stable Cardiovascular status: blood pressure returned to baseline and stable Postop Assessment: no apparent nausea or vomiting and adequate PO intake Anesthetic complications: no   No notable events documented.  Last Vitals:  Vitals:   05/18/22 1330 05/18/22 1345  BP: (!) 144/86 (!) 144/91  Pulse: 67 74  Resp: 13 15  Temp:  36.6 C  SpO2: 95% 98%    Last Pain:  Vitals:   05/18/22 1330  TempSrc:   PainSc: 9                  Alassane Kalafut,E. Delayni Streed

## 2022-05-18 NOTE — Brief Op Note (Signed)
05/18/2022  1:19 PM  PATIENT:  Micheal Roth  46 y.o. male  PRE-OPERATIVE DIAGNOSIS:  Right knee lateral meniscus tear  POST-OPERATIVE DIAGNOSIS: Right knee unstable chondromalacia  PROCEDURE:  Procedure(s) with comments: KNEE ARTHROSCOPY WITH CHONDROPLASTY  AND DEBRIDEMENT (Right) - 62  SURGEON:  Surgeon(s) and Role:    * Stann Mainland, Elly Modena, MD - Primary  PHYSICIAN ASSISTANT: Jonelle Sidle, PA-C  ANESTHESIA:   local and general  EBL:  5 cc  BLOOD ADMINISTERED:none  DRAINS: none   LOCAL MEDICATIONS USED:  MARCAINE     SPECIMEN:  No Specimen  DISPOSITION OF SPECIMEN:  N/A  COUNTS:  YES  TOURNIQUET:  * Missing tourniquet times found for documented tourniquets in log: [id number] *  DICTATION: .Note written in EPIC  PLAN OF CARE: Discharge to home after PACU  PATIENT DISPOSITION:  PACU - hemodynamically stable.   Delay start of Pharmacological VTE agent (>24hrs) due to surgical blood loss or risk of bleeding: not applicable

## 2022-05-18 NOTE — Anesthesia Procedure Notes (Signed)
Procedure Name: LMA Insertion Date/Time: 05/18/2022 12:37 PM  Performed by: [place] Manus Rudd, CRNAPre-anesthesia Checklist: Patient identified, Emergency Drugs available, Suction available and Patient being monitored Patient Re-evaluated:Patient Re-evaluated prior to induction Oxygen Delivery Method: Circle system utilized Preoxygenation: Pre-oxygenation with 100% oxygen Induction Type: IV induction Ventilation: Mask ventilation without difficulty LMA: LMA inserted LMA Size: 5.0 Number of attempts: 1 Airway Equipment and Method: Bite block Placement Confirmation: positive ETCO2 Tube secured with: Tape Dental Injury: Teeth and Oropharynx as per pre-operative assessment

## 2022-05-18 NOTE — Transfer of Care (Signed)
Immediate Anesthesia Transfer of Care Note  Patient: Micheal Roth  Procedure(s) Performed: KNEE ARTHROSCOPY WITH CHONDROPLASTY  AND DEBRIDEMENT (Right: Knee)  Patient Location: PACU  Anesthesia Type:General  Level of Consciousness: awake and drowsy  Airway & Oxygen Therapy: Patient Spontanous Breathing and Patient connected to face mask oxygen  Post-op Assessment: Report given to RN and Post -op Vital signs reviewed and stable  Post vital signs: Reviewed and stable  Last Vitals:  Vitals Value Taken Time  BP 118/79 05/18/22 1317  Temp    Pulse 56 05/18/22 1319  Resp 9 05/18/22 1319  SpO2 100 % 05/18/22 1319  Vitals shown include unvalidated device data.  Last Pain:  Vitals:   05/18/22 1012  TempSrc:   PainSc: 0-No pain      Patients Stated Pain Goal: 4 (19/62/22 9798)  Complications: No notable events documented.

## 2022-05-19 ENCOUNTER — Encounter (HOSPITAL_COMMUNITY): Payer: Self-pay | Admitting: Orthopedic Surgery

## 2022-07-24 ENCOUNTER — Ambulatory Visit
Admission: EM | Admit: 2022-07-24 | Discharge: 2022-07-24 | Disposition: A | Discharge status: Home / Self Care | Payer: 59 | Attending: Physician Assistant | Admitting: Physician Assistant

## 2022-07-24 DIAGNOSIS — J189 Pneumonia, unspecified organism: Secondary | ICD-10-CM | POA: Insufficient documentation

## 2022-07-24 DIAGNOSIS — J069 Acute upper respiratory infection, unspecified: Secondary | ICD-10-CM | POA: Diagnosis present

## 2022-07-24 DIAGNOSIS — Z1152 Encounter for screening for COVID-19: Secondary | ICD-10-CM | POA: Insufficient documentation

## 2022-07-24 LAB — RESP PANEL BY RT-PCR (RSV, FLU A&B, COVID)  RVPGX2
Influenza A by PCR: NEGATIVE
Influenza B by PCR: NEGATIVE
Resp Syncytial Virus by PCR: NEGATIVE
SARS Coronavirus 2 by RT PCR: NEGATIVE

## 2022-07-24 MED ORDER — AMOXICILLIN-POT CLAVULANATE 875-125 MG PO TABS: 1.0000 | ORAL_TABLET | Freq: Two times a day (BID) | ORAL | 0 refills | Status: DC

## 2022-07-24 NOTE — Discharge Instructions (Signed)
  Please follow up if symptoms persist or fail to improve.

## 2022-07-24 NOTE — ED Provider Notes (Signed)
[facility]    CSN: [id number] Arrival date & time: 07/24/22  0802      History   Chief Complaint Chief Complaint  Patient presents with   Cough    HPI Micheal Roth is a 46 y.o. male.   Patient here today for evaluation of cough, congestion, fever, sore throat that started 3 to 4 days ago.  He reports that his Tmax has been 101 at home.  He states he does have some shortness of breath with coughing as he has difficulty controlling cough.  He has not had any ear pain but does report some ear fullness.  He has had some vomiting today as well as intermittent diarrhea.  He states that he had asthma with exercise as a child but none otherwise.  He has tried multiple over-the-counter medications with mild relief.   The history is provided by the patient.  Cough Associated symptoms: chills, fever, shortness of breath and sore throat   Associated symptoms: no ear pain and no eye discharge     Past Medical History:  Diagnosis Date   Arthritis    Chronic back pain    Chronic left shoulder pain    Chronic neck pain    Depression    GERD (gastroesophageal reflux disease)    History of shingles    Migraines    Paresthesia of left arm and leg    and weakness of left arm and leg   Seizures ([place])    not in years    Patient Active Problem List   Diagnosis Date Noted   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   Ocular pain, right eye-  actually periorbital 01/23/2018   Exertional epigastric/chest pain 05/03/2017   H/O: substance abuse ([place]) 05/03/2017   Nausea and vomiting 04/09/2017   Elevated triglycerides with high cholesterol 03/22/2017   Low serum HDL 03/22/2017   Myalgia 03/22/2017   Obesity, Class I, BMI 30-34.9 02/22/2017   Drug abuse, smokes marijuana qd 02/22/2017   Chronic heartburn 02/22/2017   Epigastric abdominal pain 02/22/2017   h/o Seizures ([place]) 04/26/2016   History of many Head concussions 04/26/2016   h/o Chronic neck pain 04/26/2016   History of  shingles 04/26/2016   Neuralgia, postherpetic- R L Ext 04/26/2016   Edema of right lower extremity 04/26/2016   h/o Weakness of left arm 07/14/2013   h/o Paresthesia of left arm 07/14/2013   h/o Neck pain on left side 03/17/2013   h/o Pain in joint, R shoulder region 03/17/2013   Acute confusional state 12/18/2012   h/o TOBACCO USER 05/10/2009   h/o Migraine 02/15/2009   h/o chronic LOW BACK PAIN 07/10/2008   Adjustment disorder with mixed anxiety and depressed mood 07/04/2007    Past Surgical History:  Procedure Laterality Date   BACK SURGERY     KNEE ARTHROSCOPY WITH LATERAL MENISECTOMY Right 05/18/2022   Procedure: KNEE ARTHROSCOPY WITH CHONDROPLASTY  AND DEBRIDEMENT;  Surgeon: Nicholes Stairs, MD;  Location: [facility];  Service: Orthopedics;  Laterality: Right;  62   KNEE SURGERY     TUMOR REMOVAL Bilateral 1980   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 Authorizing Provider  amoxicillin-clavulanate (AUGMENTIN) 875-125 MG tablet Take 1 tablet by mouth every 12 (twelve) hours. 07/24/22  Yes Francene Finders, PA-C  HYDROcodone-acetaminophen (NORCO) 5-325 MG tablet Take 1 tablet by mouth  every 4 (four) hours as needed for moderate pain. 05/18/22   Nicholes Stairs, MD  ibuprofen (ADVIL) 200 MG tablet Take 400 mg by mouth in the morning.    [provider]  omeprazole (PRILOSEC OTC) 20 MG tablet Take 20 mg by mouth daily before breakfast.    [provider]  ondansetron (ZOFRAN) 4 MG tablet Take 1 tablet (4 mg total) by mouth every 8 (eight) hours as needed for nausea or vomiting. 05/18/22   Nicholes Stairs, MD  ondansetron (ZOFRAN-ODT) 4 MG disintegrating tablet Take 1 tablet (4 mg total) by mouth every 8 (eight) hours as needed for nausea or vomiting. Patient not taking: Reported on 05/11/2022 03/28/22   Teodora Medici, FNP    Family  History Family History  Problem Relation Age of Onset   Diabetes Father    Diabetes Maternal Grandmother    Heart disease Maternal Grandmother        CHF   Heart attack Maternal Grandmother    Crohn's disease Brother    Colon cancer Neg Hx    Colon polyps Neg Hx    Esophageal cancer Neg Hx    Stomach cancer Neg Hx    Rectal cancer Neg Hx     Social History Social History   Tobacco Use   Smoking status: Former    Packs/day: 1.00    Years: 26.00    Total pack years: 26.00    Types: Cigarettes    Start date: 1995    Quit date: 01/02/2022    Years since quitting: 0.5   Smokeless tobacco: Former  Scientific laboratory technician Use: Every day   Substances: Nicotine  Substance Use Topics   Alcohol use: No    Comment: rare   Drug use: Not Currently    Frequency: 2.0 times per week    Types: Marijuana     Allergies   Patient has no known allergies.   Review of Systems Review of Systems  Constitutional:  Positive for chills and fever.  HENT:  Positive for congestion and sore throat. Negative for ear pain.   Eyes:  Negative for discharge and redness.  Respiratory:  Positive for cough and shortness of breath.   Gastrointestinal:  Positive for diarrhea, nausea and vomiting. Negative for abdominal pain.     Physical Exam Triage Vital Signs ED Triage Vitals [07/24/22 0830]  Enc Vitals Group     BP (!) 168/99     Pulse Rate 78     Resp 18     Temp 98.9 F (37.2 C)     Temp Source Oral     SpO2 98 %     Weight      Height      Head Circumference      Peak Flow      Pain Score 7     Pain Loc      Pain Edu?      Excl. in [place]?    No data found.  Updated Vital Signs BP (!) 168/99 (BP Location: Left Arm)   Pulse 78   Temp 98.9 F (37.2 C) (Oral)   Resp 18   SpO2 98%     Physical Exam Vitals and nursing note reviewed.  Constitutional:      General: He is not in acute distress.    Appearance: Normal appearance. He is not ill-appearing.  HENT:     Head:  Normocephalic and atraumatic.     Right Ear: Tympanic  membrane normal.     Left Ear: Tympanic membrane normal.     Nose: Congestion present.     Mouth/Throat:     Mouth: Mucous membranes are moist.     Pharynx: Oropharynx is clear. No oropharyngeal exudate or posterior oropharyngeal erythema.  Eyes:     Conjunctiva/sclera: Conjunctivae normal.  Cardiovascular:     Rate and Rhythm: Normal rate and regular rhythm.     Heart sounds: Normal heart sounds. No murmur heard. Pulmonary:     Effort: Pulmonary effort is normal. No respiratory distress.     Breath sounds: Wheezing and rhonchi present. No rales.     Comments: Mild wheezing, rhonchi to right lower lung field, clear throughout otherwise Skin:    General: Skin is warm and dry.  Neurological:     Mental Status: He is alert.  Psychiatric:        Mood and Affect: Mood normal.        Thought Content: Thought content normal.      UC Treatments / Results  Labs (all labs ordered are listed, but only abnormal results are displayed) Labs Reviewed  RESP PANEL BY RT-PCR (RSV, FLU A&B, COVID)  RVPGX2    EKG   Radiology No results found.  Procedures Procedures (including critical care time)  Medications Ordered in UC Medications - No data to display  Initial Impression / Assessment and Plan / UC Course  I have reviewed the triage vital signs and the nursing notes.  Pertinent labs & imaging results that were available during my care of the patient were reviewed by me and considered in my medical decision making (see chart for details).    Suspect viral etiology of symptoms, however given fever and adventitious lung sounds on exam we will treat to cover pneumonia as well.  Recommended follow-up if no gradual improvement or with any further concerns.  Okay to continue over-the-counter medication for symptom relief.  COVID, flu, RSV screening ordered.  Patient expresses understanding of plan.  Final Clinical Impressions(s) / UC  Diagnoses   Final diagnoses:  Acute upper respiratory infection  Pneumonia of left lower lobe due to infectious organism  Encounter for screening for COVID-19     Discharge Instructions       Please follow up if symptoms persist or fail to improve.      ED Prescriptions     Medication Sig Dispense Auth. Provider   amoxicillin-clavulanate (AUGMENTIN) 875-125 MG tablet Take 1 tablet by mouth every 12 (twelve) hours. 14 tablet Francene Finders, PA-C      PDMP not reviewed this encounter.   Francene Finders, PA-C 07/24/22 [phone]

## 2022-07-24 NOTE — ED Triage Notes (Signed)
Pt c/o bodyaches, chest congestion, cough, sore throat, nasal drainage, onset ~ thurs evening.

## 2022-08-17 DIAGNOSIS — Z9889 Other specified postprocedural states: Secondary | ICD-10-CM | POA: Insufficient documentation

## 2022-10-16 ENCOUNTER — Emergency Department (HOSPITAL_COMMUNITY)
Admission: EM | Admit: 2022-10-16 | Discharge: 2022-10-16 | Disposition: A | Discharge status: Home / Self Care | Payer: 59 | Attending: Emergency Medicine | Admitting: Emergency Medicine

## 2022-10-16 ENCOUNTER — Telehealth: Payer: 59 | Admitting: Physician Assistant

## 2022-10-16 ENCOUNTER — Emergency Department (HOSPITAL_COMMUNITY): Payer: 59

## 2022-10-16 ENCOUNTER — Encounter (HOSPITAL_COMMUNITY): Payer: Self-pay

## 2022-10-16 DIAGNOSIS — R059 Cough, unspecified: Secondary | ICD-10-CM | POA: Diagnosis present

## 2022-10-16 DIAGNOSIS — U071 COVID-19: Secondary | ICD-10-CM | POA: Diagnosis not present

## 2022-10-16 LAB — CBC WITH DIFFERENTIAL/PLATELET
Abs Immature Granulocytes: 0.05 10*3/uL (ref 0.00–0.07)
Basophils Absolute: 0 10*3/uL (ref 0.0–0.1)
Basophils Relative: 0 %
Eosinophils Absolute: 0 10*3/uL (ref 0.0–0.5)
Eosinophils Relative: 0 %
HCT: 46 % (ref 39.0–52.0)
Hemoglobin: 16.6 g/dL (ref 13.0–17.0)
Immature Granulocytes: 0 %
Lymphocytes Relative: 8 %
Lymphs Abs: 1.1 10*3/uL (ref 0.7–4.0)
MCH: 32 pg (ref 26.0–34.0)
MCHC: 36.1 g/dL — ABNORMAL HIGH (ref 30.0–36.0)
MCV: 88.6 fL (ref 80.0–100.0)
Monocytes Absolute: 1 10*3/uL (ref 0.1–1.0)
Monocytes Relative: 7 %
Neutro Abs: 11.7 10*3/uL — ABNORMAL HIGH (ref 1.7–7.7)
Neutrophils Relative %: 85 %
Platelets: 241 10*3/uL (ref 150–400)
RBC: 5.19 MIL/uL (ref 4.22–5.81)
RDW: 13 % (ref 11.5–15.5)
WBC: 13.9 10*3/uL — ABNORMAL HIGH (ref 4.0–10.5)
nRBC: 0 % (ref 0.0–0.2)

## 2022-10-16 LAB — COMPREHENSIVE METABOLIC PANEL
ALT: 45 U/L — ABNORMAL HIGH (ref 0–44)
AST: 33 U/L (ref 15–41)
Albumin: 4.2 g/dL (ref 3.5–5.0)
Alkaline Phosphatase: 53 U/L (ref 38–126)
Anion gap: 11 (ref 5–15)
BUN: 11 mg/dL (ref 6–20)
CO2: 24 mmol/L (ref 22–32)
Calcium: 8.6 mg/dL — ABNORMAL LOW (ref 8.9–10.3)
Chloride: 102 mmol/L (ref 98–111)
Creatinine, Ser: 0.98 mg/dL (ref 0.61–1.24)
GFR, Estimated: >60 mL/min (ref >60)
Glucose, Bld: 124 mg/dL — ABNORMAL HIGH (ref 70–99)
Potassium: 3.7 mmol/L (ref 3.5–5.1)
Sodium: 137 mmol/L (ref 135–145)
Total Bilirubin: 0.6 mg/dL (ref 0.3–1.2)
Total Protein: 7.6 g/dL (ref 6.5–8.1)

## 2022-10-16 LAB — RESP PANEL BY RT-PCR (RSV, FLU A&B, COVID)  RVPGX2
Influenza A by PCR: NEGATIVE
Influenza B by PCR: NEGATIVE
Resp Syncytial Virus by PCR: NEGATIVE
SARS Coronavirus 2 by RT PCR: POSITIVE — AB

## 2022-10-16 MED ORDER — HYDROCODONE BIT-HOMATROP MBR 5-1.5 MG/5ML PO SOLN
5.0000 mL | Freq: Four times a day (QID) | ORAL | Status: DC | PRN
  Administered 2022-10-16: 5 mL via ORAL
  Filled 2022-10-16: qty 5

## 2022-10-16 MED ORDER — ACETAMINOPHEN 500 MG PO TABS
1000.0000 mg | ORAL_TABLET | Freq: Once | ORAL | Status: AC
  Administered 2022-10-16: 1000 mg via ORAL
  Filled 2022-10-16: qty 2

## 2022-10-16 MED ORDER — ALBUTEROL SULFATE HFA 108 (90 BASE) MCG/ACT IN AERS: 1.0000 | INHALATION_SPRAY | Freq: Four times a day (QID) | RESPIRATORY_TRACT | 0 refills | Status: DC | PRN

## 2022-10-16 MED ORDER — METOCLOPRAMIDE HCL 5 MG/ML IJ SOLN
10.0000 mg | Freq: Once | INTRAMUSCULAR | Status: AC
  Administered 2022-10-16: 10 mg via INTRAVENOUS
  Filled 2022-10-16: qty 2

## 2022-10-16 MED ORDER — ALBUTEROL SULFATE (2.5 MG/3ML) 0.083% IN NEBU
2.5000 mg | INHALATION_SOLUTION | Freq: Once | RESPIRATORY_TRACT | Status: AC
  Administered 2022-10-16: 2.5 mg via RESPIRATORY_TRACT
  Filled 2022-10-16: qty 3

## 2022-10-16 MED ORDER — NIRMATRELVIR/RITONAVIR (PAXLOVID)TABLET: 3.0000 | ORAL_TABLET | Freq: Two times a day (BID) | ORAL | 0 refills | Status: AC

## 2022-10-16 MED ORDER — PROMETHAZINE-DM 6.25-15 MG/5ML PO SYRP: 5.0000 mL | ORAL_SOLUTION | Freq: Four times a day (QID) | ORAL | 0 refills | Status: DC | PRN

## 2022-10-16 MED ORDER — SODIUM CHLORIDE 0.9 % IV BOLUS
1000.0000 mL | Freq: Once | INTRAVENOUS | Status: AC
  Administered 2022-10-16: 1000 mL via INTRAVENOUS

## 2022-10-16 NOTE — ED Provider Triage Note (Signed)
Emergency Medicine Provider Triage Evaluation Note  Micheal Roth , a 47 y.o. male  was evaluated in triage.  Pt complains of cough, congestion, nausea, vomiting.  He notes that he has been sick for 4 days, possibly testing positive for COVID at the beginning of this illness.  Since that time he has been prescribed Reglan and has been unable to swallow it, has been prescribed COVID medication Paxlovid likely, has been unable to obtain.  He presents via EMS.  EMS reports patient was hypotensive tachycardic, but awake, alert, not hypotensive in transport..  Review of Systems  Positive: Cough, congestion, nausea Negative: Syncope  Physical Exam  BP (!) 145/93   Pulse 94   Temp 100.2 F (37.9 C)   Resp (!) 24   Ht 6' (1.829 m)   Wt 99.8 kg   SpO2 98%   BMI 29.84 kg/m  Gen:   Awake, no distress uncomfortable appearing adult male Resp:  Increased work of breathing, tachypnea MSK:   Moves extremities without difficulty no obvious deformity Other:  Neuro, psych both unremarkable  Medical Decision Making  Medically screening exam initiated at 3:03 PM.  Appropriate orders placed.  Micheal Roth was informed that the remainder of the evaluation will be completed by another provider, this initial triage assessment does not replace that evaluation, and the importance of remaining in the ED until their evaluation is complete.   Micheal Muskrat, MD 10/16/22 1505

## 2022-10-16 NOTE — ED Provider Notes (Signed)
[facility] Provider Note   CSN: [id number] Arrival date & time: 10/16/22  1456     History  No chief complaint on file.   Micheal Roth is a 47 y.o. male.  With history of seizures, GERD, depression, arthritis who presents to the ED for evaluation of COVID, nausea and vomiting.  He states his symptoms of congestion, rhinorrhea, cough, subjective fevers began on Friday.  He took a home test which was positive for COVID that day.  He had an E visit today for his symptoms and was prescribed Paxlovid.  He states he has not picked this up yet.  He has been unable to eat or drink today.  His nausea and vomiting began today.  The nausea is constant.  Vomiting occurs each time he tries to eat or drink.  He has not checked his temperature at home.  He denies abdominal pain.  He has occasionally coughed up specks of blood mixed in with yellow sputum.  He has associated shortness of breath which is present at rest and worsens after episodes of coughing.  He has chest wall pain as well. HPI     Home Medications Prior to Admission medications   Medication Sig Start Date End Date Taking? Authorizing Provider  albuterol (VENTOLIN HFA) 108 (90 Base) MCG/ACT inhaler Inhale 1-2 puffs into the lungs every 6 (six) hours as needed for wheezing or shortness of breath. 10/16/22  Yes Guynell Kleiber, [place] Folk, PA-C  ibuprofen (ADVIL) 200 MG tablet Take 400 mg by mouth in the morning.    [provider]  nirmatrelvir/ritonavir (PAXLOVID) 20 x 150 MG & 10 x 100MG TABS Take 3 tablets by mouth 2 (two) times daily for 5 days. (Take nirmatrelvir 150 mg two tablets twice daily for 5 days and ritonavir 100 mg one tablet twice daily for 5 days) Patient GFR is 113 10/16/22 10/21/22  [place], Clearnce Sorrel, PA-C  omeprazole (PRILOSEC OTC) 20 MG tablet Take 20 mg by mouth daily before breakfast.    [provider]  promethazine-dextromethorphan (PROMETHAZINE-DM)  6.25-15 MG/5ML syrup Take 5 mLs by mouth 4 (four) times daily as needed for cough. 10/16/22   Mar Daring, PA-C      Allergies    Patient has no known allergies.    Review of Systems   Review of Systems  Respiratory:  Positive for cough and shortness of breath.   Gastrointestinal:  Positive for nausea and vomiting.  All other systems reviewed and are negative.   Physical Exam Updated Vital Signs BP (!) 153/75   Pulse 85   Temp 100.2 F (37.9 C) (Oral)   Resp 20   Ht 6' (1.829 m)   Wt 99.8 kg   SpO2 100%   BMI 29.84 kg/m  Physical Exam Vitals and nursing note reviewed.  Constitutional:      General: He is not in acute distress.    Appearance: He is well-developed. He is not ill-appearing, toxic-appearing or diaphoretic.     Comments: Resting comfortably in bed  HENT:     Head: Normocephalic and atraumatic.     Mouth/Throat:     Mouth: Mucous membranes are moist.     Pharynx: Posterior oropharyngeal erythema present. No oropharyngeal exudate.     Comments: Uvula midline.  Tonsils 1+ bilaterally.  Postnasal drip Eyes:     Conjunctiva/sclera: Conjunctivae normal.  Cardiovascular:     Rate and Rhythm: Normal rate and regular rhythm.  Heart sounds: No murmur heard. Pulmonary:     Effort: Pulmonary effort is normal. No respiratory distress.     Breath sounds: Normal breath sounds. No stridor. No wheezing, rhonchi or rales.  Abdominal:     Palpations: Abdomen is soft.     Tenderness: There is no abdominal tenderness. There is no guarding.  Musculoskeletal:        General: No swelling.     Cervical back: Neck supple. No rigidity or tenderness.  Skin:    General: Skin is warm and dry.     Capillary Refill: Capillary refill takes less than 2 seconds.  Neurological:     General: No focal deficit present.     Mental Status: He is alert and oriented to person, place, and time.  Psychiatric:        Mood and Affect: Mood normal.     ED Results / Procedures /  Treatments   Labs (all labs ordered are listed, but only abnormal results are displayed) Labs Reviewed  RESP PANEL BY RT-PCR (RSV, FLU A&B, COVID)  RVPGX2 - Abnormal; Notable for the following components:      Result Value   SARS Coronavirus 2 by RT PCR POSITIVE (*)    All other components within normal limits  COMPREHENSIVE METABOLIC PANEL - Abnormal; Notable for the following components:   Glucose, Bld 124 (*)    Calcium 8.6 (*)    ALT 45 (*)    All other components within normal limits  CBC WITH DIFFERENTIAL/PLATELET - Abnormal; Notable for the following components:   WBC 13.9 (*)    MCHC 36.1 (*)    Neutro Abs 11.7 (*)    All other components within normal limits    EKG None  Radiology DG Chest Port 1 View  Result Date: 10/16/2022 CLINICAL DATA:  COVID positive EXAM: PORTABLE CHEST 1 VIEW COMPARISON:  Chest x-ray dated June 22, 2014 FINDINGS: The heart size and mediastinal contours are within normal limits. Both lungs are clear. The visualized skeletal structures are unremarkable. IMPRESSION: Lungs are clear. Electronically Signed   By: Yetta Glassman M.D.   On: 10/16/2022 16:46    Procedures Procedures    Medications Ordered in ED Medications  HYDROcodone bit-homatropine (HYCODAN) 5-1.5 MG/5ML syrup 5 mL (5 mLs Oral Given 10/16/22 1512)  sodium chloride 0.9 % bolus 1,000 mL (0 mLs Intravenous Stopped 10/16/22 1736)  metoCLOPramide (REGLAN) injection 10 mg (10 mg Intravenous Given 10/16/22 1520)  acetaminophen (TYLENOL) tablet 1,000 mg (1,000 mg Oral Given 10/16/22 1540)  albuterol (PROVENTIL) (2.5 MG/3ML) 0.083% nebulizer solution 2.5 mg (2.5 mg Nebulization Given 10/16/22 1722)    ED Course/ Medical Decision Making/ A&P Clinical Course as of 10/16/22 1759  Mon Oct 16, 2022  1709 On reevaluation, patient states that his nausea and coughing has improved some.  He is still coughing quite frequently.  Will provide albuterol nebulizer and p.o. challenge [AS]    Clinical  Course User Index [AS] Takiesha Mcdevitt, Grafton Folk, PA-C                             Medical Decision Making Amount and/or Complexity of Data Reviewed Labs: ordered. Radiology: ordered.  Risk OTC drugs. Prescription drug management.  This patient presents to the ED for concern of COVID, this involves an extensive number of treatment options, and is a complaint that carries with it a high risk of complications and morbidity.   Co morbidities that complicate  the patient evaluation  seizures, GERD, depression, arthritis  My initial workup includes basic labs, nausea control, IV fluids  Additional history obtained from: Nursing notes from this visit.  I ordered, reviewed and interpreted labs which include: CBC, CMP, respiratory panel.  Hyperglycemia of 124. Leukocytosis of 13.9 likely secondary to COVID.  COVID-positive.  I ordered imaging studies including chest x-ray I independently visualized and interpreted imaging which showed normal I agree with the radiologist interpretation  Low-grade fever of 102.  Symptoms slightly hypertensive.  Labs otherwise unremarkable.  47 year old male presents the ED for evaluation of cough and shortness of breath.  He did test positive for COVID.  4 days ago.  He has been prescribed Paxlovid and cough medicine but has not picked these up yet. On exam, He is resting comfortably in bed.  He is coughing frequently.  He is in no respiratory distress.  He is not hypoxic.  His labs significant for positive COVID.  He did have leukocytosis of 13.9 which is likely reactive to his COVID.  He was treated for his symptoms and reported improvement.  He will be sent a prescription for albuterol and encouraged to take the medications that were sent for him earlier.  He was given return precautions.  Stable at discharge.  At this time there does not appear to be any evidence of an acute emergency medical condition and the patient appears stable for discharge with appropriate  outpatient follow up. Diagnosis was discussed with patient who verbalizes understanding of care plan and is agreeable to discharge. I have discussed return precautions with patient and significant other who verbalizes understanding. Patient encouraged to follow-up with their PCP within 1 week. All questions answered.  Note: Portions of this report may have been transcribed using voice recognition software. Every effort was made to ensure accuracy; however, inadvertent computerized transcription errors may still be present.         Final Clinical Impression(s) / ED Diagnoses Final diagnoses:  [id number]    Rx / DC Orders ED Discharge Orders          Ordered    albuterol (VENTOLIN HFA) 108 (90 Base) MCG/ACT inhaler  Every 6 hours PRN        10/16/22 1747              Roylene Reason, PA-C 10/16/22 1800    Drenda Freeze, MD 10/16/22 2242

## 2022-10-16 NOTE — Progress Notes (Signed)
Virtual Visit Consent   Micheal Roth, you are scheduled for a virtual visit with a [facility] provider today. Just as with appointments in the office, your consent must be obtained to participate. Your consent will be active for this visit and any virtual visit you may have with one of our providers in the next 365 days. If you have a MyChart account, a copy of this consent can be sent to you electronically.  As this is a virtual visit, video technology does not allow for your provider to perform a traditional examination. This may limit your provider's ability to fully assess your condition. If your provider identifies any concerns that need to be evaluated in person or the need to arrange testing (such as labs, EKG, etc.), we will make arrangements to do so. Although advances in technology are sophisticated, we cannot ensure that it will always work on either your end or our end. If the connection with a video visit is poor, the visit may have to be switched to a telephone visit. With either a video or telephone visit, we are not always able to ensure that we have a secure connection.  By engaging in this virtual visit, you consent to the provision of healthcare and authorize for your insurance to be billed (if applicable) for the services provided during this visit. Depending on your insurance coverage, you may receive a charge related to this service.  I need to obtain your verbal consent now. Are you willing to proceed with your visit today? JAMIAS DAMM has provided verbal consent on 10/16/2022 for a virtual visit (video or telephone). Mar Daring, PA-C  Date: 10/16/2022 9:02 AM  Virtual Visit via Video Note   I, Mar Daring, connected with  [name]  ([id number], 08/27/1976) on 10/16/22 at  9:00 AM EST by a video-enabled telemedicine application and verified that I am speaking with the correct person using two identifiers.  Location: Patient: Virtual  Visit Location Patient: Home Provider: Virtual Visit Location Provider: Home Office   I discussed the limitations of evaluation and management by telemedicine and the availability of in person appointments. The patient expressed understanding and agreed to proceed.    History of Present Illness: Micheal Roth is a 47 y.o. who identifies as a male who was assigned male at birth, and is being seen today for fever and cough.  HPI: Cough This is a new problem. The current episode started in the past 7 days (Tested positive for Covid 19 on Friday; symptoms started Friday). The problem has been gradually worsening. The problem occurs every few minutes. The cough is Productive of sputum, productive of purulent sputum and productive of blood-tinged sputum. Associated symptoms include chills, a fever (101), headaches, myalgias, nasal congestion, postnasal drip, rhinorrhea and a sore throat. Pertinent negatives include no ear congestion, ear pain, shortness of breath, weight loss or wheezing. The symptoms are aggravated by lying down. Treatments tried: alka seltzer cold and flu, tylenol, ibuprofen, robitussin dm, nyquil. The treatment provided no relief. His past medical history is significant for asthma (exercise induced), bronchitis and pneumonia.     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   Ocular pain, right eye-  actually periorbital 01/23/2018   Exertional epigastric/chest pain 05/03/2017   H/O: substance abuse ([place]) 05/03/2017   Nausea and vomiting 04/09/2017   Elevated triglycerides with high cholesterol 03/22/2017   Low serum HDL 03/22/2017  Myalgia 03/22/2017   Obesity, Class I, BMI 30-34.9 02/22/2017   Drug abuse, smokes marijuana qd 02/22/2017   Chronic heartburn 02/22/2017   Epigastric abdominal pain 02/22/2017   h/o Seizures ([place]) 04/26/2016   History of many Head concussions 04/26/2016   h/o Chronic neck pain 04/26/2016    History of shingles 04/26/2016   Neuralgia, postherpetic- R L Ext 04/26/2016   Edema of right lower extremity 04/26/2016   h/o Weakness of left arm 07/14/2013   h/o Paresthesia of left arm 07/14/2013   h/o Neck pain on left side 03/17/2013   h/o Pain in joint, R shoulder region 03/17/2013   Acute confusional state 12/18/2012   h/o TOBACCO USER 05/10/2009   h/o Migraine 02/15/2009   h/o chronic LOW BACK PAIN 07/10/2008   Adjustment disorder with mixed anxiety and depressed mood 07/04/2007    Allergies: No Known Allergies Medications:  Current Outpatient Medications:    nirmatrelvir/ritonavir (PAXLOVID) 20 x 150 MG & 10 x 100MG TABS, Take 3 tablets by mouth 2 (two) times daily for 5 days. (Take nirmatrelvir 150 mg two tablets twice daily for 5 days and ritonavir 100 mg one tablet twice daily for 5 days) Patient GFR is 113, Disp: 30 tablet, Rfl: 0   promethazine-dextromethorphan (PROMETHAZINE-DM) 6.25-15 MG/5ML syrup, Take 5 mLs by mouth 4 (four) times daily as needed for cough., Disp: 118 mL, Rfl: 0   ibuprofen (ADVIL) 200 MG tablet, Take 400 mg by mouth in the morning., Disp: , Rfl:    omeprazole (PRILOSEC OTC) 20 MG tablet, Take 20 mg by mouth daily before breakfast., Disp: , Rfl:   Observations/Objective: Patient is well-developed, well-nourished in no acute distress.  Resting comfortably at home.  Head is normocephalic, atraumatic.  No labored breathing.  Speech is clear and coherent with logical content.  Patient is alert and oriented at baseline.    Assessment and Plan: 1. COVID-19 - nirmatrelvir/ritonavir (PAXLOVID) 20 x 150 MG & 10 x 100MG TABS; Take 3 tablets by mouth 2 (two) times daily for 5 days. (Take nirmatrelvir 150 mg two tablets twice daily for 5 days and ritonavir 100 mg one tablet twice daily for 5 days) Patient GFR is 113  Dispense: 30 tablet; Refill: 0 - promethazine-dextromethorphan (PROMETHAZINE-DM) 6.25-15 MG/5ML syrup; Take 5 mLs by mouth 4 (four) times daily  as needed for cough.  Dispense: 118 mL; Refill: 0 - MyChart COVID-19 home monitoring program; Future  - Continue OTC symptomatic management of choice - Will send OTC vitamins and supplement information through AVS - Paxlovid and Promethazine DM prescribed - Patient enrolled in MyChart symptom monitoring - Push fluids - Rest as needed - Discussed return precautions and when to seek in-person evaluation, sent via AVS as well   Follow Up Instructions: I discussed the assessment and treatment plan with the patient. The patient was provided an opportunity to ask questions and all were answered. The patient agreed with the plan and demonstrated an understanding of the instructions.  A copy of instructions were sent to the patient via MyChart unless otherwise noted below.    The patient was advised to call back or seek an in-person evaluation if the symptoms worsen or if the condition fails to improve as anticipated.  Time:  I spent 11 minutes with the patient via telehealth technology discussing the above problems/concerns.    Mar Daring, PA-C

## 2022-10-16 NOTE — Patient Instructions (Signed)
Micheal Roth, thank you for joining Micheal Daring, PA-C for today's virtual visit.  While this provider is not your primary care provider (PCP), if your PCP is located in our provider database this encounter information will be shared with them immediately following your visit.   [facility] account gives you access to today's visit and all your visits, tests, and labs performed at [facility] " click here if you don't have a [facility] account or go to [email or web address]  Consent: (Patient) Micheal Roth provided verbal consent for this virtual visit at the beginning of the encounter.  Current Medications:  Current Outpatient Medications:    nirmatrelvir/ritonavir (PAXLOVID) 20 x 150 MG & 10 x 100MG TABS, Take 3 tablets by mouth 2 (two) times daily for 5 days. (Take nirmatrelvir 150 mg two tablets twice daily for 5 days and ritonavir 100 mg one tablet twice daily for 5 days) Patient GFR is 113, Disp: 30 tablet, Rfl: 0   promethazine-dextromethorphan (PROMETHAZINE-DM) 6.25-15 MG/5ML syrup, Take 5 mLs by mouth 4 (four) times daily as needed for cough., Disp: 118 mL, Rfl: 0   ibuprofen (ADVIL) 200 MG tablet, Take 400 mg by mouth in the morning., Disp: , Rfl:    omeprazole (PRILOSEC OTC) 20 MG tablet, Take 20 mg by mouth daily before breakfast., Disp: , Rfl:    Medications ordered in this encounter:  Meds ordered this encounter  Medications   nirmatrelvir/ritonavir (PAXLOVID) 20 x 150 MG & 10 x 100MG TABS    Sig: Take 3 tablets by mouth 2 (two) times daily for 5 days. (Take nirmatrelvir 150 mg two tablets twice daily for 5 days and ritonavir 100 mg one tablet twice daily for 5 days) Patient GFR is 113    Dispense:  30 tablet    Refill:  0    Order Specific Question:   Supervising Provider    Answer:   Chase Picket [id number]   promethazine-dextromethorphan (PROMETHAZINE-DM) 6.25-15 MG/5ML syrup    Sig: Take 5 mLs by mouth 4 (four)  times daily as needed for cough.    Dispense:  118 mL    Refill:  0    Order Specific Question:   Supervising Provider    Answer:   Chase Picket [id number]     *If you need refills on other medications prior to your next appointment, please contact your pharmacy*  Follow-Up: Call back or seek an in-person evaluation if the symptoms worsen or if the condition fails to improve as anticipated.  [place] [phone]  Care Instructions: Paxlovid - Nirmatrelvir; Ritonavir Tablets What is this medication? NIRMATRELVIR; RITONAVIR (NIR ma TREL vir; ri TOE na veer) treats mild to moderate COVID-19. It may help people who are at high risk of developing severe illness. It works by limiting the spread of the virus in your body. This medicine may be used for other purposes; ask your health care provider or pharmacist if you have questions. COMMON BRAND NAME(S): PAXLOVID What should I tell my care team before I take this medication? They need to know if you have any of these conditions: Any allergies Any serious illness Kidney disease Liver disease An unusual or allergic reaction to nirmatrelvir, ritonavir, other medications, foods, dyes, or preservatives Pregnant or trying to get pregnant Breast-feeding How should I use this medication? This product contains 2 different medications that are packaged together. For the standard dose, take 2 pink tablets of nirmatrelvir with 1 white  tablet of ritonavir (3 tablets total) by mouth with water twice daily. Talk to your care team if you have kidney disease. You may need a different dose. Swallow the tablets whole. You can take it with or without food. If it upsets your stomach, take it with food. Take all of this medication unless your care team tells you to stop it early. Keep taking it even if you think you are better. Talk to your care team about the use of this medication in children. While it may be prescribed for children as  young as 12 years for selected conditions, precautions do apply. Overdosage: If you think you have taken too much of this medicine contact a poison control center or emergency room at once. NOTE: This medicine is only for you. Do not share this medicine with others. What if I miss a dose? If you miss a dose, take it as soon as you can unless it is more than 8 hours late. If it is more than 8 hours late, skip the missed dose. Take the next dose at the normal time. Do not take extra or 2 doses at the same time to make up for the missed dose. What may interact with this medication? Do not take this medication with any of the following medications: Alfuzosin Certain medications for anxiety or sleep like midazolam, triazolam Certain medications for cancer like apalutamide, enzalutamide Certain medications for cholesterol like lovastatin, simvastatin Certain medications for irregular heart beat like amiodarone, dronedarone, flecainide, propafenone, quinidine Certain medications for pain like meperidine, piroxicam Certain medications for psychotic disorders like clozapine, lurasidone, pimozide Certain medications for seizures like carbamazepine, phenobarbital, phenytoin Colchicine Eletriptan Eplerenone Ergot alkaloids like dihydroergotamine, ergonovine, ergotamine, methylergonovine Finerenone Flibanserin Ivabradine Lomitapide Naloxegol Ranolazine Rifampin Sildenafil Silodosin St. John's Wort Tolvaptan Ubrogepant Voclosporin This medication may also interact with the following medications: Bedaquiline Birth control pills Bosentan Certain antibiotics like erythromycin or clarithromycin Certain medications for blood pressure like amlodipine, diltiazem, felodipine, nicardipine, nifedipine Certain medications for cancer like abemaciclib, ceritinib, dasatinib, encorafenib, ibrutinib, ivosidenib, neratinib, nilotinib, venetoclax, vinblastine, vincristine Certain medications for cholesterol  like atorvastatin, rosuvastatin Certain medications for depression like bupropion, trazodone Certain medications for fungal infections like isavuconazonium, itraconazole, ketoconazole, voriconazole Certain medications for hepatitis C like elbasvir; grazoprevir, dasabuvir; ombitasvir; paritaprevir; ritonavir, glecaprevir; pibrentasvir, sofosbuvir; velpatasvir; voxilaprevir Certain medications for HIV or AIDS Certain medications for irregular heartbeat like lidocaine Certain medications that treat or prevent blood clots like rivaroxaban, warfarin Digoxin Fentanyl Medications that lower your chance of fighting infection like cyclosporine, sirolimus, tacrolimus Methadone Quetiapine Rifabutin Salmeterol Steroid medications like betamethasone, budesonide, ciclesonide, dexamethasone, fluticasone, methylprednisolone, mometasone, triamcinolone This list may not describe all possible interactions. Give your health care provider a list of all the medicines, herbs, non-prescription drugs, or dietary supplements you use. Also tell them if you smoke, drink alcohol, or use illegal drugs. Some items may interact with your medicine. What should I watch for while using this medication? Your condition will be monitored carefully while you are receiving this medication. Visit your care team for regular checkups. Tell your care team if your symptoms do not start to get better or if they get worse. If you have untreated HIV infection, this medication may lead to some HIV medications not working as well in the future. Estrogen and progestin hormones may not work as well while you are taking this medication. Your care team can help you find the contraceptive option that works for you. What side effects may I notice from receiving  this medication? Side effects that you should report to your care team as soon as possible: Allergic reactions--skin rash, itching, hives, swelling of the face, lips, tongue, or throat Liver  injury--right upper belly pain, loss of appetite, nausea, light-colored stool, dark yellow or brown urine, yellowing skin or eyes, unusual weakness or fatigue Redness, blistering, peeling, or loosening of the skin, including inside the mouth Side effects that usually do not require medical attention (report these to your care team if they continue or are bothersome): Change in taste Diarrhea General discomfort and fatigue Increase in blood pressure Muscle pain Nausea Stomach pain This list may not describe all possible side effects. Call your doctor for medical advice about side effects. You may report side effects to FDA at 1-800-FDA-1088. Where should I keep my medication? Keep out of the reach of children and pets. Store at room temperature between 20 and 25 degrees C (68 and 77 degrees F). Get rid of any unused medication after the expiration date. To get rid of medications that are no longer needed or have expired: Take the medication to a medication take-back program. Check with your pharmacy or law enforcement to find a location. If you cannot return the medication, check the label or package insert to see if the medication should be thrown out in the garbage or flushed down the toilet. If you are not sure, ask your care team. If it is safe to put it in the trash, take the medication out of the container. Mix the medication with cat litter, dirt, coffee grounds, or other unwanted substance. Seal the mixture in a bag or container. Put it in the trash. NOTE: This sheet is a summary. It may not cover all possible information. If you have questions about this medicine, talk to your doctor, pharmacist, or health care provider.  2023 Elsevier/Gold Standard (2020-08-30 00:00:00)    Isolation Instructions: You are to isolate at home for 5 days from onset of your symptoms. If you must be around other household members who do not have symptoms, you need to make sure that both you and the family  members are masking consistently with a high-quality mask.  After day 5 of isolation, if you have had no fever within 24 hours and you are feeling better, you can end isolation but need to mask for an additional 5 days.  After day 5 if you have a fever or are having significant symptoms, please isolate for full 10 days.  If you note any worsening of symptoms despite treatment, please seek an in-person evaluation ASAP. If you note any significant shortness of breath or any chest pain, please seek ER evaluation. Please do not delay care!   COVID-19: What to Do if You Are Sick If you test positive and are an older adult or someone who is at high risk of getting very sick from COVID-19, treatment may be available. Contact a healthcare provider right away after a positive test to determine if you are eligible, even if your symptoms are mild right now. You can also visit a Test to Treat location and, if eligible, receive a prescription from a provider. Don't delay: Treatment must be started within the first few days to be effective. If you have a fever, cough, or other symptoms, you might have COVID-19. Most people have mild illness and are able to recover at home. If you are sick: Keep track of your symptoms. If you have an emergency warning sign (including trouble breathing), call 911. Steps  to help prevent the spread of COVID-19 if you are sick If you are sick with COVID-19 or think you might have COVID-19, follow the steps below to care for yourself and to help protect other people in your home and community. Stay home except to get medical care Stay home. Most people with COVID-19 have mild illness and can recover at home without medical care. Do not leave your home, except to get medical care. Do not visit public areas and do not go to places where you are unable to wear a mask. Take care of yourself. Get rest and stay hydrated. Take over-the-counter medicines, such as acetaminophen, to help you  feel better. Stay in touch with your doctor. Call before you get medical care. Be sure to get care if you have trouble breathing, or have any other emergency warning signs, or if you think it is an emergency. Avoid public transportation, ride-sharing, or taxis if possible. Get tested If you have symptoms of COVID-19, get tested. While waiting for test results, stay away from others, including staying apart from those living in your household. Get tested as soon as possible after your symptoms start. Treatments may be available for people with COVID-19 who are at risk for becoming very sick. Don't delay: Treatment must be started early to be effective--some treatments must begin within 5 days of your first symptoms. Contact your healthcare provider right away if your test result is positive to determine if you are eligible. Self-tests are one of several options for testing for the virus that causes COVID-19 and may be more convenient than laboratory-based tests and point-of-care tests. Ask your healthcare provider or your local health department if you need help interpreting your test results. You can visit your state, tribal, local, and territorial health department's website to look for the latest local information on testing sites. Separate yourself from other people As much as possible, stay in a specific room and away from other people and pets in your home. If possible, you should use a separate bathroom. If you need to be around other people or animals in or outside of the home, wear a well-fitting mask. Tell your close contacts that they may have been exposed to COVID-19. An infected person can spread COVID-19 starting 48 hours (or 2 days) before the person has any symptoms or tests positive. By letting your close contacts know they may have been exposed to COVID-19, you are helping to protect everyone. See COVID-19 and Animals if you have questions about pets. If you are diagnosed with COVID-19,  someone from the health department may call you. Answer the call to slow the spread. Monitor your symptoms Symptoms of COVID-19 include fever, cough, or other symptoms. Follow care instructions from your healthcare provider and local health department. Your local health authorities may give instructions on checking your symptoms and reporting information. When to seek emergency medical attention Look for emergency warning signs* for COVID-19. If someone is showing any of these signs, seek emergency medical care immediately: Trouble breathing Persistent pain or pressure in the chest New confusion Inability to wake or stay awake Pale, gray, or blue-colored skin, lips, or nail beds, depending on skin tone *This list is not all possible symptoms. Please call your medical provider for any other symptoms that are severe or concerning to you. Call 911 or call ahead to your local emergency facility: Notify the operator that you are seeking care for someone who has or may have COVID-19. Call ahead before visiting your  doctor Call ahead. Many medical visits for routine care are being postponed or done by phone or telemedicine. If you have a medical appointment that cannot be postponed, call your doctor's office, and tell them you have or may have COVID-19. This will help the office protect themselves and other patients. If you are sick, wear a well-fitting mask You should wear a mask if you must be around other people or animals, including pets (even at home). Wear a mask with the best fit, protection, and comfort for you. You don't need to wear the mask if you are alone. If you can't put on a mask (because of trouble breathing, for example), cover your coughs and sneezes in some other way. Try to stay at least 6 feet away from other people. This will help protect the people around you. Masks should not be placed on young children under age 87 years, anyone who has trouble breathing, or anyone who is not  able to remove the mask without help. Cover your coughs and sneezes Cover your mouth and nose with a tissue when you cough or sneeze. Throw away used tissues in a lined trash can. Immediately wash your hands with soap and water for at least 20 seconds. If soap and water are not available, clean your hands with an alcohol-based hand sanitizer that contains at least 60% alcohol. Clean your hands often Wash your hands often with soap and water for at least 20 seconds. This is especially important after blowing your nose, coughing, or sneezing; going to the bathroom; and before eating or preparing food. Use hand sanitizer if soap and water are not available. Use an alcohol-based hand sanitizer with at least 60% alcohol, covering all surfaces of your hands and rubbing them together until they feel dry. Soap and water are the best option, especially if hands are visibly dirty. Avoid touching your eyes, nose, and mouth with unwashed hands. Handwashing Tips Avoid sharing personal household items Do not share dishes, drinking glasses, cups, eating utensils, towels, or bedding with other people in your home. Wash these items thoroughly after using them with soap and water or put in the dishwasher. Clean surfaces in your home regularly Clean and disinfect high-touch surfaces (for example, doorknobs, tables, handles, light switches, and countertops) in your "sick room" and bathroom. In shared spaces, you should clean and disinfect surfaces and items after each use by the person who is ill. If you are sick and cannot clean, a caregiver or other person should only clean and disinfect the area around you (such as your bedroom and bathroom) on an as needed basis. Your caregiver/other person should wait as long as possible (at least several hours) and wear a mask before entering, cleaning, and disinfecting shared spaces that you use. Clean and disinfect areas that may have blood, stool, or body fluids on them. Use  household cleaners and disinfectants. Clean visible dirty surfaces with household cleaners containing soap or detergent. Then, use a household disinfectant. Use a product from [employer] List N: Disinfectants for Coronavirus ([id number]). Be sure to follow the instructions on the label to ensure safe and effective use of the product. Many products recommend keeping the surface wet with a disinfectant for a certain period of time (look at "contact time" on the product label). You may also need to wear personal protective equipment, such as gloves, depending on the directions on the product label. Immediately after disinfecting, wash your hands with soap and water for 20 seconds. For completed guidance on  cleaning and disinfecting your home, visit Complete Disinfection Guidance. Take steps to improve ventilation at home Improve ventilation (air flow) at home to help prevent from spreading COVID-19 to other people in your household. Clear out COVID-19 virus particles in the air by opening windows, using air filters, and turning on fans in your home. Use this interactive tool to learn how to improve air flow in your home. When you can be around others after being sick with COVID-19 Deciding when you can be around others is different for different situations. Find out when you can safely end home isolation. For any additional questions about your care, contact your healthcare provider or state or local health department. 11/23/2020 Content source: [facility] for Immunization and Respiratory Diseases ([facility]), Division of Viral Diseases This information is not intended to replace advice given to you by your health care provider. Make sure you discuss any questions you have with your health care provider. Document Revised: 01/06/2021 Document Reviewed: 01/06/2021 Elsevier Patient Education  2022 [employer].    If you have been instructed to have an in-person evaluation today at a local Urgent Care  facility, please use the link below. It will take you to a list of all of our available [facility], including address, phone number and hours of operation. Please do not delay care.  [facility]  If you or a family member do not have a primary care provider, use the link below to schedule a visit and establish care. When you choose a [facility] primary care physician or advanced practice provider, you gain a long-term partner in health. Find a Primary Care Provider  Learn more about 's in-office and virtual care options: [facility]

## 2022-10-16 NOTE — Discharge Instructions (Addendum)
You have been seen today for your complaint of COVID, cough. Your lab work showed that you are COVID-positive. Your imaging was reassuring and showed no abnormalities. Your discharge medications include the prescriptions that you were sent earlier.  I have also sent you a prescription for an albuterol inhaler to help with the cough and shortness of breath. Follow up with: Your primary care provider in 1 week Please seek immediate medical care if you develop any of the following symptoms: You have trouble breathing. You have pain or pressure in your chest. You are confused. You have bluish lips and fingernails. You have trouble waking from sleep. You have symptoms that get worse. At this time there does not appear to be the presence of an emergent medical condition, however there is always the potential for conditions to change. Please read and follow the below instructions.  Do not take your medicine if  develop an itchy rash, swelling in your mouth or lips, or difficulty breathing; call 911 and seek immediate emergency medical attention if this occurs.  You may review your lab tests and imaging results in their entirety on your MyChart account.  Please discuss all results of fully with your primary care provider and other specialist at your follow-up visit.  Note: Portions of this text may have been transcribed using voice recognition software. Every effort was made to ensure accuracy; however, inadvertent computerized transcription errors may still be present.

## 2022-10-16 NOTE — ED Triage Notes (Signed)
Pt bib ems from home; tested positive for covid Friday, presecribed meds; n/v today unable to keep anything down; c/o pain all over; 184/96, P 80, 97% RA, cbg 124

## 2022-10-16 NOTE — ED Notes (Signed)
Patient verbalizes understanding of discharge instructions. Opportunity for questioning and answers were provided. Armband removed by staff, pt discharged from ED. Pt taken to ED entrance via wheel chair.  

## 2023-03-01 ENCOUNTER — Ambulatory Visit
Admission: RE | Admit: 2023-03-01 | Discharge: 2023-03-01 | Disposition: A | Discharge status: Home / Self Care | Payer: 59 | Source: Ambulatory Visit | Attending: Nurse Practitioner | Admitting: Nurse Practitioner

## 2023-03-01 VITALS — BP 144/79 | HR 68 | Temp 98.0°F | Resp 17 | Ht 72.0 in | Wt 230.0 lb

## 2023-03-01 DIAGNOSIS — R42 Dizziness and giddiness: Secondary | ICD-10-CM

## 2023-03-01 DIAGNOSIS — H6992 Unspecified Eustachian tube disorder, left ear: Secondary | ICD-10-CM

## 2023-03-01 MED ORDER — MECLIZINE HCL 25 MG PO TABS: 25.0000 mg | ORAL_TABLET | Freq: Three times a day (TID) | ORAL | 0 refills | Status: DC | PRN

## 2023-03-01 MED ORDER — FLUTICASONE PROPIONATE 50 MCG/ACT NA SUSP: 1.0000 | Freq: Every day | NASAL | 0 refills | Status: DC

## 2023-03-01 MED ORDER — PREDNISONE 20 MG PO TABS: 40.0000 mg | ORAL_TABLET | Freq: Every day | ORAL | 0 refills | Status: AC

## 2023-03-01 NOTE — ED Triage Notes (Signed)
Patient here today with c/o left ear pain X 2 months but has been gradually getting worse. He is not having some pain in his left jaw and throat. Dizziness started last night.

## 2023-03-01 NOTE — Discharge Instructions (Signed)
Start prednisone daily for 5 days Flonase daily  Meclizine as needed for vertigo symptoms.  Please note this medication will make you drowsy.  Do not drink alcohol or drive on this medication Please follow-up with your PCP if your symptoms do not improve Please go to the ER if develop any worsening symptoms. I refilled better soon!

## 2023-03-01 NOTE — ED Provider Notes (Addendum)
[facility]    CSN: [id number] Arrival date & time: 03/01/23  1020      History   Chief Complaint Chief Complaint  Patient presents with   Otalgia    HPI Micheal Roth is a 47 y.o. male presents for evaluation of ear pain and dizziness.  Patient reports 2 months of intermittent left ear pain.  He states he always has ringing in his ears and this has not changed.  Denies any drainage from the ear, fever/chills, URI symptoms.  No allergy symptoms.  Last night he began to experience vertigo symptoms that he describes as the room is spinning.  Denies any nausea/vomiting, syncope, headache, visual changes.  States he has a history of vertigo and the symptoms are consistent with that.  He states he has been too busy at work to try to get any over-the-counter medications to treat either his ear pain or vertigo symptoms.  No other concerns at this time.   Otalgia   Past Medical History:  Diagnosis Date   Arthritis    Chronic back pain    Chronic left shoulder pain    Chronic neck pain    Depression    GERD (gastroesophageal reflux disease)    History of shingles    Migraines    Nausea and vomiting 04/09/2017   Paresthesia of left arm and leg    and weakness of left arm and leg   Seizures (HCC)    not in years    Patient Active Problem List   Diagnosis Date Noted   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   Ocular pain, right eye-  actually periorbital 01/23/2018   Exertional epigastric/chest pain 05/03/2017   H/O: substance abuse (HCC) 05/03/2017   Nausea and vomiting 04/09/2017   Elevated triglycerides with high cholesterol 03/22/2017   Low serum HDL 03/22/2017   Myalgia 03/22/2017   Obesity, Class I, BMI 30-34.9 02/22/2017   Drug abuse, smokes marijuana qd 02/22/2017   Chronic heartburn 02/22/2017   Epigastric abdominal pain 02/22/2017   h/o Seizures (HCC) 04/26/2016   History of many Head concussions 04/26/2016   h/o Chronic neck pain 04/26/2016   History of  shingles 04/26/2016   Neuralgia, postherpetic- R L Ext 04/26/2016   Edema of right lower extremity 04/26/2016   h/o Weakness of left arm 07/14/2013   h/o Paresthesia of left arm 07/14/2013   h/o Neck pain on left side 03/17/2013   h/o Pain in joint, R shoulder region 03/17/2013   Acute confusional state 12/18/2012   h/o TOBACCO USER 05/10/2009   h/o Migraine 02/15/2009   h/o chronic LOW BACK PAIN 07/10/2008   Adjustment disorder with mixed anxiety and depressed mood 07/04/2007    Past Surgical History:  Procedure Laterality Date   BACK SURGERY     KNEE ARTHROSCOPY WITH LATERAL MENISECTOMY Right 05/18/2022   Procedure: KNEE ARTHROSCOPY WITH CHONDROPLASTY  AND DEBRIDEMENT;  Surgeon: Yolonda Kida, MD;  Location: [facility];  Service: Orthopedics;  Laterality: Right;  62   KNEE SURGERY     TUMOR REMOVAL Bilateral 1980   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 Authorizing Provider  albuterol (VENTOLIN HFA) 108 (90 Base) MCG/ACT inhaler Inhale 1-2 puffs into the lungs every 6 (six) hours as needed for wheezing or shortness of breath. 10/16/22  Yes Schutt, Edsel Petrin, PA-C  fluticasone (FLONASE) 50  MCG/ACT nasal spray Place 1 spray into both nostrils daily. 03/01/23  Yes Radford Pax, NP  ibuprofen (ADVIL) 200 MG tablet Take 400 mg by mouth in the morning.   Yes [provider]  meclizine (ANTIVERT) 25 MG tablet Take 1 tablet (25 mg total) by mouth 3 (three) times daily as needed for dizziness. 03/01/23  Yes Radford Pax, NP  omeprazole (PRILOSEC OTC) 20 MG tablet Take 20 mg by mouth daily before breakfast.   Yes [provider]  predniSONE (DELTASONE) 20 MG tablet Take 2 tablets (40 mg total) by mouth daily with breakfast for 5 days. 03/01/23 03/06/23 Yes Radford Pax, NP  promethazine-dextromethorphan (PROMETHAZINE-DM) 6.25-15 MG/5ML syrup Take 5 mLs by  mouth 4 (four) times daily as needed for cough. 10/16/22   Margaretann Loveless, PA-C    Family History Family History  Problem Relation Age of Onset   Diabetes Father    Diabetes Maternal Grandmother    Heart disease Maternal Grandmother        CHF   Heart attack Maternal Grandmother    Crohn's disease Brother    Colon cancer Neg Hx    Colon polyps Neg Hx    Esophageal cancer Neg Hx    Stomach cancer Neg Hx    Rectal cancer Neg Hx     Social History Social History   Tobacco Use   Smoking status: Former    Packs/day: 1.00    Years: 26.00    Additional pack years: 0.00    Total pack years: 26.00    Types: Cigarettes    Start date: 54    Quit date: 01/02/2022    Years since quitting: 1.1   Smokeless tobacco: Former  Building services engineer Use: Every day   Substances: Nicotine  Substance Use Topics   Alcohol use: No    Comment: rare   Drug use: Not Currently    Frequency: 2.0 times per week    Types: Marijuana     Allergies   Patient has no known allergies.   Review of Systems Review of Systems  HENT:  Positive for ear pain.   Neurological:  Positive for dizziness.     Physical Exam Triage Vital Signs ED Triage Vitals [03/01/23 1031]  Enc Vitals Group     BP (!) 144/79     Pulse Rate 68     Resp 17     Temp 98 F (36.7 C)     Temp Source Oral     SpO2 95 %     Weight 230 lb (104.3 kg)     Height 6' (1.829 m)     Head Circumference      Peak Flow      Pain Score 7     Pain Loc      Pain Edu?      Excl. in GC?    No data found.  Updated Vital Signs BP (!) 144/79 (BP Location: Left Arm)   Pulse 68   Temp 98 F (36.7 C) (Oral)   Resp 17   Ht 6' (1.829 m)   Wt 230 lb (104.3 kg)   SpO2 95%   BMI 31.19 kg/m   Visual Acuity Right Eye Distance:   Left Eye Distance:   Bilateral Distance:    Right Eye Near:   Left Eye Near:    Bilateral Near:     Physical Exam Vitals and nursing note reviewed.  Constitutional:  General: He is not  in acute distress.    Appearance: Normal appearance. He is not ill-appearing.  HENT:     Head: Normocephalic and atraumatic.     Right Ear: Tympanic membrane and ear canal normal.     Left Ear: No swelling or tenderness. A middle ear effusion is present. No mastoid tenderness. Tympanic membrane is not perforated or erythematous.     Nose: Nose normal.  Eyes:     Pupils: Pupils are equal, round, and reactive to light.  Cardiovascular:     Rate and Rhythm: Normal rate.  Pulmonary:     Effort: Pulmonary effort is normal.  Skin:    General: Skin is warm and dry.  Neurological:     General: No focal deficit present.     Mental Status: He is alert and oriented to person, place, and time.  Psychiatric:        Mood and Affect: Mood normal.        Behavior: Behavior normal.      UC Treatments / Results  Labs (all labs ordered are listed, but only abnormal results are displayed) Labs Reviewed - No data to display  Comprehensive metabolic panel Order: [id number] Status: Final result     Visible to patient: Yes (seen)     Next appt: None   0 Result Notes           Component Ref Range & Units 4 mo ago (10/16/22) 11 mo ago (03/28/22) 2 yr ago (06/02/20) 5 yr ago (04/11/17) 6 yr ago (02/15/17) 6 yr ago (02/15/17) 6 yr ago (02/14/17) 6 yr ago (02/14/17)  Sodium 135 - 145 mmol/L 137 139 R 138 140  137 R  138 R  Potassium 3.5 - 5.1 mmol/L 3.7 4.4 R 3.9 3.9  4.1 R  4.2 R  Chloride 98 - 111 mmol/L 102 103 R 105 106 R      CO2 22 - 32 mmol/L 24 21 R 27 25      Glucose, Bld 70 - 99 mg/dL 409 High  811 High  914 High  CM 96 R  98 R  114 R  Comment: Glucose reference range applies only to samples taken after fasting for at least 8 hours.  BUN 6 - 20 mg/dL 11 14 R 10 12  17  R  18 R  Creatinine, Ser 0.61 - 1.24 mg/dL 7.82 9.56 R 2.13 0.86  1.1 R  1.0 R  Calcium 8.9 - 10.3 mg/dL 8.6 Low  9.0 R 8.8 Low  9.4      Total Protein 6.5 - 8.1 g/dL 7.6 6.7 R  7.7      Albumin 3.5 - 5.0 g/dL  4.2 4.3 R  4.1      AST 15 - 41 U/L 33 24 R  21 36 R  40 R   ALT 0 - 44 U/L 45 High  26 R  26 R 47 Abnormal  R  45 Abnormal  R   Alkaline Phosphatase 38 - 126 U/L 53 59 R  53 48 R  53 R   Total Bilirubin 0.3 - 1.2 mg/dL 0.6 0.6 R  0.9      GFR, Estimated >60 mL/min >60         Comment: (NOTE) Calculated using the CKD-EPI Creatinine Equation (2021)  Anion gap 5 - 15 11  6  CM 9      Comment: Performed at [facility], [address] [place], [place] [address]  Resulting Agency [facility] Abstrct Entry Abstrct Entry Abstrct Entry Abstrct Entry         Specimen Collected: 10/16/22 15:18 Last Resulted: 10/16/22 15:58        EKG   Radiology No results found.  Procedures Procedures (including critical care time)  Medications Ordered in UC Medications - No data to display  Initial Impression / Assessment and Plan / UC Course  I have reviewed the triage vital signs and the nursing notes.  Pertinent labs & imaging results that were available during my care of the patient were reviewed by me and considered in my medical decision making (see chart for details).     Reviewed exam and symptoms with patient.  No red flags.  Discussed eustachian tube dysfunction as cause of ear pain that is likely triggering his vertigo symptoms.  Start Flonase and prednisone.  Trial of meclizine as needed for vertigo symptoms.  Side effect profile reviewed and he verbalized understanding.  Advised PCP follow-up if symptoms do not improve.  ER precautions reviewed and patient verbalized understanding Final Clinical Impressions(s) / UC Diagnoses   Final diagnoses:  Eustachian tube dysfunction, left  Vertigo     Discharge Instructions      Start prednisone daily for 5 days Flonase daily  Meclizine as needed for vertigo symptoms.  Please note this medication will make you drowsy.  Do not drink alcohol or drive on this medication Please follow-up with  your PCP if your symptoms do not improve Please go to the ER if develop any worsening symptoms. I refilled better soon!   ED Prescriptions     Medication Sig Dispense Auth. Provider   fluticasone (FLONASE) 50 MCG/ACT nasal spray Place 1 spray into both nostrils daily. 15.8 mL Radford Pax, NP   predniSONE (DELTASONE) 20 MG tablet Take 2 tablets (40 mg total) by mouth daily with breakfast for 5 days. 10 tablet Radford Pax, NP   meclizine (ANTIVERT) 25 MG tablet Take 1 tablet (25 mg total) by mouth 3 (three) times daily as needed for dizziness. 20 tablet Radford Pax, NP      PDMP not reviewed this encounter.   Radford Pax, NP 03/01/23 1047    Radford Pax, NP 03/01/23 1047

## 2023-03-30 ENCOUNTER — Ambulatory Visit
Admission: RE | Admit: 2023-03-30 | Discharge: 2023-03-30 | Disposition: A | Discharge status: Home / Self Care | Payer: 59 | Source: Ambulatory Visit

## 2023-03-30 VITALS — BP 156/81 | HR 60 | Temp 98.2°F | Resp 16

## 2023-03-30 DIAGNOSIS — H6592 Unspecified nonsuppurative otitis media, left ear: Secondary | ICD-10-CM

## 2023-03-30 MED ORDER — PREDNISONE 20 MG PO TABS: 40.0000 mg | ORAL_TABLET | Freq: Every day | ORAL | 0 refills | Status: AC

## 2023-03-30 MED ORDER — AMOXICILLIN-POT CLAVULANATE 875-125 MG PO TABS: 1.0000 | ORAL_TABLET | Freq: Two times a day (BID) | ORAL | 0 refills | Status: DC

## 2023-03-30 NOTE — ED Triage Notes (Signed)
Pt states left ear pain seen here for same about one month ago. States he was given steroids and flonase which worked but now the pain is back.

## 2023-03-30 NOTE — ED Provider Notes (Signed)
[facility]    CSN: [id number] Arrival date & time: 03/30/23  4098      History   Chief Complaint Chief Complaint  Patient presents with   Ear Fullness    Entered by patient    HPI Micheal Roth is a 47 y.o. male.   Patient presents with left ear pain. patient states that he was seen approximately 1 month ago and was told that he had fluid behind his eardrum.  He was prescribed Flonase, prednisone steroid burst, meclizine with mild improvement in symptoms.  Patient states that he is now having some pain in his left ear that is increased.  Denies any fever or any associated upper respiratory symptoms.   Ear Fullness    Past Medical History:  Diagnosis Date   Arthritis    Chronic back pain    Chronic left shoulder pain    Chronic neck pain    Depression    GERD (gastroesophageal reflux disease)    History of shingles    Migraines    Nausea and vomiting 04/09/2017   Paresthesia of left arm and leg    and weakness of left arm and leg   Seizures (HCC)    not in years    Patient Active Problem List   Diagnosis Date Noted   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   Ocular pain, right eye-  actually periorbital 01/23/2018   Exertional epigastric/chest pain 05/03/2017   H/O: substance abuse (HCC) 05/03/2017   Nausea and vomiting 04/09/2017   Elevated triglycerides with high cholesterol 03/22/2017   Low serum HDL 03/22/2017   Myalgia 03/22/2017   Obesity, Class I, BMI 30-34.9 02/22/2017   Drug abuse, smokes marijuana qd 02/22/2017   Chronic heartburn 02/22/2017   Epigastric abdominal pain 02/22/2017   h/o Seizures (HCC) 04/26/2016   History of many Head concussions 04/26/2016   h/o Chronic neck pain 04/26/2016   History of shingles 04/26/2016   Neuralgia, postherpetic- R L Ext 04/26/2016   Edema of right lower extremity 04/26/2016   h/o Weakness of left arm 07/14/2013   h/o Paresthesia of left arm 07/14/2013   h/o Neck pain on left side 03/17/2013   h/o  Pain in joint, R shoulder region 03/17/2013   Acute confusional state 12/18/2012   h/o TOBACCO USER 05/10/2009   h/o Migraine 02/15/2009   h/o chronic LOW BACK PAIN 07/10/2008   Adjustment disorder with mixed anxiety and depressed mood 07/04/2007    Past Surgical History:  Procedure Laterality Date   BACK SURGERY     KNEE ARTHROSCOPY WITH LATERAL MENISECTOMY Right 05/18/2022   Procedure: KNEE ARTHROSCOPY WITH CHONDROPLASTY  AND DEBRIDEMENT;  Surgeon: Yolonda Kida, MD;  Location: [facility];  Service: Orthopedics;  Laterality: Right;  62   KNEE SURGERY     TUMOR REMOVAL Bilateral 1980   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 Authorizing Provider  amoxicillin-clavulanate (AUGMENTIN) 875-125 MG tablet Take 1 tablet by mouth every 12 (twelve) hours. 03/30/23  Yes Aashritha Miedema, Rolly Salter E, FNP  predniSONE (DELTASONE) 20 MG tablet Take 2 tablets (40 mg total) by mouth daily for 5 days. 03/30/23 04/04/23 Yes Maudry Zeidan, Acie Fredrickson, FNP  albuterol (VENTOLIN HFA) 108 (90 Base) MCG/ACT inhaler Inhale 1-2 puffs into the lungs every 6 (six) hours as needed for wheezing or shortness of breath. 10/16/22   Schutt, Edsel Petrin,  PA-C  fluticasone (FLONASE) 50 MCG/ACT nasal spray Place 1 spray into both nostrils daily. 03/01/23   Radford Pax, NP  ibuprofen (ADVIL) 200 MG tablet Take 400 mg by mouth in the morning.    [provider]  meclizine (ANTIVERT) 25 MG tablet Take 1 tablet (25 mg total) by mouth 3 (three) times daily as needed for dizziness. 03/01/23   Radford Pax, NP  omeprazole (PRILOSEC OTC) 20 MG tablet Take 20 mg by mouth daily before breakfast.    [provider]  promethazine-dextromethorphan (PROMETHAZINE-DM) 6.25-15 MG/5ML syrup Take 5 mLs by mouth 4 (four) times daily as needed for cough. 10/16/22   Margaretann Loveless, PA-C    Family History Family History  Problem  Relation Age of Onset   Diabetes Father    Diabetes Maternal Grandmother    Heart disease Maternal Grandmother        CHF   Heart attack Maternal Grandmother    Crohn's disease Brother    Colon cancer Neg Hx    Colon polyps Neg Hx    Esophageal cancer Neg Hx    Stomach cancer Neg Hx    Rectal cancer Neg Hx     Social History Social History   Tobacco Use   Smoking status: Former    Current packs/day: 0.00    Average packs/day: 1 pack/day for 28.3 years (28.3 ttl pk-yrs)    Types: Cigarettes    Start date: 9    Quit date: 01/02/2022    Years since quitting: 1.2   Smokeless tobacco: Former  Building services engineer status: Every Day   Substances: Nicotine  Substance Use Topics   Alcohol use: No    Comment: rare   Drug use: Not Currently    Frequency: 2.0 times per week    Types: Marijuana     Allergies   Patient has no known allergies.   Review of Systems Review of Systems Per HPI  Physical Exam Triage Vital Signs ED Triage Vitals [03/30/23 0938]  Encounter Vitals Group     BP (!) 156/81     Systolic BP Percentile      Diastolic BP Percentile      Pulse Rate 60     Resp 16     Temp 98.2 F (36.8 C)     Temp Source Oral     SpO2 96 %     Weight      Height      Head Circumference      Peak Flow      Pain Score 7     Pain Loc      Pain Education      Exclude from Growth Chart    No data found.  Updated Vital Signs BP (!) 156/81 (BP Location: Left Arm)   Pulse 60   Temp 98.2 F (36.8 C) (Oral)   Resp 16   SpO2 96%   Visual Acuity Right Eye Distance:   Left Eye Distance:   Bilateral Distance:    Right Eye Near:   Left Eye Near:    Bilateral Near:     Physical Exam Constitutional:      General: He is not in acute distress.    Appearance: Normal appearance. He is not toxic-appearing or diaphoretic.  HENT:     Head: Normocephalic and atraumatic.     Left Ear: Ear canal and external ear normal. No drainage, swelling or tenderness. A middle  ear effusion is present.  There is no impacted cerumen. No foreign body. No mastoid tenderness. Tympanic membrane is erythematous and bulging. Tympanic membrane is not perforated.     Ears:     Comments: Patient has large amount of fluid behind left TM which is slightly erythematous and cloudy.  TM appears intact. Eyes:     Extraocular Movements: Extraocular movements intact.     Conjunctiva/sclera: Conjunctivae normal.  Pulmonary:     Effort: Pulmonary effort is normal.  Neurological:     General: No focal deficit present.     Mental Status: He is alert and oriented to person, place, and time. Mental status is at baseline.  Psychiatric:        Mood and Affect: Mood normal.        Behavior: Behavior normal.        Thought Content: Thought content normal.        Judgment: Judgment normal.      UC Treatments / Results  Labs (all labs ordered are listed, but only abnormal results are displayed) Labs Reviewed - No data to display  EKG   Radiology No results found.  Procedures Procedures (including critical care time)  Medications Ordered in UC Medications - No data to display  Initial Impression / Assessment and Plan / UC Course  I have reviewed the triage vital signs and the nursing notes.  Pertinent labs & imaging results that were available during my care of the patient were reviewed by me and considered in my medical decision making (see chart for details).     Patient has fluid behind TM which is slightly erythematous.  Given this, will opt to treat with Augmentin antibiotic and another steroid burst.  Patient encouraged to follow-up with ENT specialist at provided phone number given this has been a recurrent and persistent issue.  Advised family medicine follow-up as well.  Patient verbalized understanding and was agreeable with plan. Final Clinical Impressions(s) / UC Diagnoses   Final diagnoses:  Fluid level behind tympanic membrane of left ear     Discharge  Instructions      I have prescribed you an antibiotic and prednisone given appearance of your ear and fluid behind your eardrum.  Please follow-up with ear, nose, throat doctor for further evaluation and management.    ED Prescriptions     Medication Sig Dispense Auth. Provider   predniSONE (DELTASONE) 20 MG tablet Take 2 tablets (40 mg total) by mouth daily for 5 days. 10 tablet [place], Rolly Salter E, [place]   amoxicillin-clavulanate (AUGMENTIN) 875-125 MG tablet Take 1 tablet by mouth every 12 (twelve) hours. 14 tablet [place], Acie Fredrickson, [place]      PDMP not reviewed this encounter.   Gustavus Bryant, [place] 03/30/23 1000

## 2023-03-30 NOTE — Discharge Instructions (Signed)
I have prescribed you an antibiotic and prednisone given appearance of your ear and fluid behind your eardrum.  Please follow-up with ear, nose, throat doctor for further evaluation and management.

## 2023-04-03 ENCOUNTER — Other Ambulatory Visit: Payer: Self-pay

## 2023-04-03 ENCOUNTER — Ambulatory Visit
Admission: RE | Admit: 2023-04-03 | Discharge: 2023-04-03 | Disposition: A | Discharge status: Home / Self Care | Payer: 59 | Source: Ambulatory Visit | Attending: Emergency Medicine | Admitting: Emergency Medicine

## 2023-04-03 VITALS — BP 132/83 | HR 88 | Temp 98.2°F | Resp 18 | Ht 72.0 in | Wt 229.9 lb

## 2023-04-03 DIAGNOSIS — R062 Wheezing: Secondary | ICD-10-CM

## 2023-04-03 DIAGNOSIS — H9202 Otalgia, left ear: Secondary | ICD-10-CM

## 2023-04-03 DIAGNOSIS — K029 Dental caries, unspecified: Secondary | ICD-10-CM

## 2023-04-03 MED ORDER — ALBUTEROL SULFATE HFA 108 (90 BASE) MCG/ACT IN AERS
2.0000 | INHALATION_SPRAY | Freq: Once | RESPIRATORY_TRACT | Status: AC
  Administered 2023-04-03: 2 via RESPIRATORY_TRACT

## 2023-04-03 NOTE — Discharge Instructions (Signed)
To find a primary care provider at [facility], go to [email or web address], click on "primary care," click on "new patients: schedule online," choose family medicine or internal medicine, and choose an appointment place and time that fits your schedule.   Try using saline irrigation, such as with a neti pot, several times a day while you are sick. Many neti pots come with salt packets premeasured to use to make saline. If you use your own salt, make sure it is kosher salt or sea salt (don't use table salt as it has iodine in it and you don't need that in your nose). Use distilled water to make saline. If you mix your own saline using your own salt, the recipe is 1/4 teaspoon salt in 1 cup warm water. Using saline irrigation can help prevent and treat sinus infections and help manage allergies.   Continue using Flonase as prescribed.    Please follow up with a dentist as soon as possible.

## 2023-04-03 NOTE — ED Triage Notes (Signed)
"  I was here Friday and given medications for URI and Ear infection, Something popped in left ear since and ear hurts more now and extends to face/jaw". No fever.

## 2023-04-04 NOTE — ED Provider Notes (Signed)
[facility]    CSN: [id number] Arrival date & time: 04/03/23  1023      History   Chief Complaint Chief Complaint  Patient presents with   Ear Fullness    Appointment    HPI Micheal Roth is a 47 y.o. male. Pt has had L ear pain and pressure for over a month. Was initially seen at this urgent care on 03/01/23 and started on flonase, 5 days of prednisone, and meclizine prn. He was seen again at this urgent care on 03/30/23 for increased pain in L ear, dx with AOM, given 5 days of prednisone and given augmentin, continued on flonase. He feels like something "popped" in his L ear a couple of days ago and pain in the L ear has been much worse since. Also now has L jaw pain when he chews/bites down in his jaw muscle. Is still taking prednisone and augmentin, still using flonase. Has used nasal saline spray before but none recently.   While talking with the patient, he seemed to be be mildly wheezing. 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 Since COVID, he has had an increase in wheezing episodes and has needed to use inhaler more often. Is running low on albuterol, requests refill. Is not SOB at this time.   Does not have a PCP but is interested in finding one.    Ear Fullness    Past Medical History:  Diagnosis Date   Arthritis    Chronic back pain    Chronic left shoulder pain    Chronic neck pain    Depression    GERD (gastroesophageal reflux disease)    History of shingles    Migraines    Nausea and vomiting 04/09/2017   Paresthesia of left arm and leg    and weakness of left arm and leg   Seizures (HCC)    not in years    Patient Active Problem List   Diagnosis Date Noted   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   Ocular pain, right eye-  actually periorbital 01/23/2018   Exertional epigastric/chest pain 05/03/2017   H/O: substance abuse (HCC) 05/03/2017   Nausea and vomiting  04/09/2017   Elevated triglycerides with high cholesterol 03/22/2017   Low serum HDL 03/22/2017   Myalgia 03/22/2017   Obesity, Class I, BMI 30-34.9 02/22/2017   Drug abuse, smokes marijuana qd 02/22/2017   Chronic heartburn 02/22/2017   Epigastric abdominal pain 02/22/2017   h/o Seizures (HCC) 04/26/2016   History of many Head concussions 04/26/2016   h/o Chronic neck pain 04/26/2016   History of shingles 04/26/2016   Neuralgia, postherpetic- R L Ext 04/26/2016   Edema of right lower extremity 04/26/2016   h/o Weakness of left arm 07/14/2013   h/o Paresthesia of left arm 07/14/2013   h/o Neck pain on left side 03/17/2013   h/o Pain in joint, R shoulder region 03/17/2013   Acute confusional state 12/18/2012   h/o TOBACCO USER 05/10/2009   h/o Migraine 02/15/2009   h/o chronic LOW BACK PAIN 07/10/2008   Adjustment disorder with mixed anxiety and depressed mood 07/04/2007    Past Surgical History:  Procedure Laterality Date   BACK SURGERY     KNEE ARTHROSCOPY WITH LATERAL MENISECTOMY Right 05/18/2022   Procedure: KNEE ARTHROSCOPY WITH CHONDROPLASTY  AND DEBRIDEMENT;  Surgeon: Yolonda Kida, MD;  Location: [facility];  Service: Orthopedics;  Laterality:  Right;  62   KNEE SURGERY     TUMOR REMOVAL Bilateral 1980   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 Authorizing Provider  albuterol (VENTOLIN HFA) 108 (90 Base) MCG/ACT inhaler Inhale 1-2 puffs into the lungs every 6 (six) hours as needed for wheezing or shortness of breath. 10/16/22  Yes Schutt, Edsel Petrin, PA-C  amoxicillin-clavulanate (AUGMENTIN) 875-125 MG tablet Take 1 tablet by mouth every 12 (twelve) hours. 03/30/23  Yes Mound, Rolly Salter E, FNP  fluticasone (FLONASE) 50 MCG/ACT nasal spray Place 1 spray into both nostrils daily. 03/01/23  Yes Radford Pax, NP  omeprazole (PRILOSEC OTC) 20 MG tablet Take 20 mg by  mouth daily before breakfast.   Yes [provider]  predniSONE (DELTASONE) 20 MG tablet Take 2 tablets (40 mg total) by mouth daily for 5 days. 03/30/23 04/04/23 Yes Mound, Rolly Salter E, FNP  ibuprofen (ADVIL) 200 MG tablet Take 400 mg by mouth in the morning.    [provider]  meclizine (ANTIVERT) 25 MG tablet Take 1 tablet (25 mg total) by mouth 3 (three) times daily as needed for dizziness. 03/01/23   Radford Pax, NP  promethazine-dextromethorphan (PROMETHAZINE-DM) 6.25-15 MG/5ML syrup Take 5 mLs by mouth 4 (four) times daily as needed for cough. 10/16/22   Margaretann Loveless, PA-C    Family History Family History  Problem Relation Age of Onset   Diabetes Father    Diabetes Maternal Grandmother    Heart disease Maternal Grandmother        CHF   Heart attack Maternal Grandmother    Crohn's disease Brother    Colon cancer Neg Hx    Colon polyps Neg Hx    Esophageal cancer Neg Hx    Stomach cancer Neg Hx    Rectal cancer Neg Hx     Social History Social History   Tobacco Use   Smoking status: Former    Current packs/day: 0.00    Average packs/day: 1 pack/day for 28.3 years (28.3 ttl pk-yrs)    Types: Cigarettes    Start date: 35    Quit date: 01/02/2022    Years since quitting: 1.2   Smokeless tobacco: Former  Building services engineer status: Former   Substances: Nicotine  Substance Use Topics   Alcohol use: Yes    Comment: rare   Drug use: Not Currently    Frequency: 2.0 times per week    Types: Marijuana     Allergies   Patient has no known allergies.   Review of Systems Review of Systems   Physical Exam Triage Vital Signs ED Triage Vitals  Encounter Vitals Group     BP 04/03/23 1036 132/83     Systolic BP Percentile --      Diastolic BP Percentile --      Pulse Rate 04/03/23 1036 88     Resp 04/03/23 1036 18     Temp 04/03/23 1036 98.2 F (36.8 C)     Temp Source 04/03/23 1036 Oral     SpO2 04/03/23 1036 97 %     Weight 04/03/23 1034  229 lb 15 oz (104.3 kg)     Height 04/03/23 1034 6' (1.829 m)     Head Circumference --      Peak Flow --      Pain Score 04/03/23 1034 8  Pain Loc --      Pain Education --      Exclude from Growth Chart --    No data found.  Updated Vital Signs BP 132/83 (BP Location: Left Arm)   Pulse 88   Temp 98.2 F (36.8 C) (Oral)   Resp 18   Ht 6' (1.829 m)   Wt 229 lb 15 oz (104.3 kg)   SpO2 97%   BMI 31.19 kg/m   Visual Acuity Right Eye Distance:   Left Eye Distance:   Bilateral Distance:    Right Eye Near:   Left Eye Near:    Bilateral Near:     Physical Exam Constitutional:      General: He is not in acute distress.    Appearance: Normal appearance. He is not ill-appearing.  HENT:     Right Ear: Hearing, tympanic membrane, ear canal and external ear normal.     Left Ear: Hearing, tympanic membrane, ear canal and external ear normal.     Nose: No congestion or rhinorrhea.     Mouth/Throat:     Mouth: Mucous membranes are moist.     Dentition: Dental caries present.     Pharynx: Oropharynx is clear. No oropharyngeal exudate or posterior oropharyngeal erythema.     Comments: Molar in L upper jaw is broken off and decayed root fragments are still in gum. I do not see swelling/mouth infection around this area Cardiovascular:     Rate and Rhythm: Normal rate and regular rhythm.  Pulmonary:     Effort: Pulmonary effort is normal.     Breath sounds: Examination of the left-upper field reveals wheezing. Examination of the right-middle field reveals wheezing. Examination of the right-lower field reveals wheezing. Wheezing present.     Comments: Faint wheezing in LUL, RML, and RLL Neurological:     Mental Status: He is alert.      UC Treatments / Results  Labs (all labs ordered are listed, but only abnormal results are displayed) Labs Reviewed - No data to display  EKG   Radiology No results found.  Procedures Procedures (including critical care  time)  Medications Ordered in UC Medications  albuterol (VENTOLIN HFA) 108 (90 Base) MCG/ACT inhaler 2 puff (2 puffs Inhalation Given 04/03/23 1150)    Initial Impression / Assessment and Plan / UC Course  I have reviewed the triage vital signs and the nursing notes.  Pertinent labs & imaging results that were available during my care of the patient were reviewed by me and considered in my medical decision making (see chart for details).      I suspect L ear/jaw pain may be related to poor dentition, decaying old broken molar roots. His TMs bilaterally look normal. He may have some eustachian tube defect/OME that puts pressure on his TMs and can cause "popping" sounds. Recommended saline irrigation. Recommended close dental f/u. Already on augmentin.   Pt given and used an albuterol inhaler at urgent care that he then took home. Advised him to get a PCP as he may need different asthma medicines as it seems his asthma severity has changed.    Final Clinical Impressions(s) / UC Diagnoses   Final diagnoses:  Dental caries  Left ear pain  Wheezing     Discharge Instructions      To find a primary care provider at [facility], go to [email or web address], click on "primary care," click on "new patients: schedule online," choose family medicine or internal medicine, and choose an appointment  place and time that fits your schedule.   Try using saline irrigation, such as with a neti pot, several times a day while you are sick. Many neti pots come with salt packets premeasured to use to make saline. If you use your own salt, make sure it is kosher salt or sea salt (don't use table salt as it has iodine in it and you don't need that in your nose). Use distilled water to make saline. If you mix your own saline using your own salt, the recipe is 1/4 teaspoon salt in 1 cup warm water. Using saline irrigation can help prevent and treat sinus infections and help manage allergies.   Continue using  Flonase as prescribed.    Please follow up with a dentist as soon as possible.      ED Prescriptions   None    PDMP not reviewed this encounter.   Cathlyn Parsons, NP 04/04/23 1434

## 2023-05-01 ENCOUNTER — Ambulatory Visit
Admission: RE | Admit: 2023-05-01 | Discharge: 2023-05-01 | Disposition: A | Discharge status: Home / Self Care | Payer: 59 | Source: Ambulatory Visit | Attending: Emergency Medicine | Admitting: Emergency Medicine

## 2023-05-01 VITALS — BP 128/74 | HR 80 | Temp 98.7°F | Resp 20 | Ht 72.0 in | Wt 230.0 lb

## 2023-05-01 DIAGNOSIS — F32A Depression, unspecified: Secondary | ICD-10-CM

## 2023-05-01 DIAGNOSIS — F411 Generalized anxiety disorder: Secondary | ICD-10-CM

## 2023-05-01 MED ORDER — ONDANSETRON 4 MG PO TBDP: 4.0000 mg | ORAL_TABLET | Freq: Three times a day (TID) | ORAL | 0 refills | Status: AC | PRN

## 2023-05-01 MED ORDER — ESCITALOPRAM OXALATE 5 MG PO TABS: 5.0000 mg | ORAL_TABLET | Freq: Every day | ORAL | 0 refills | Status: DC

## 2023-05-01 MED ORDER — HYDROXYZINE HCL 50 MG PO TABS: 50.0000 mg | ORAL_TABLET | Freq: Four times a day (QID) | ORAL | 1 refills | Status: DC | PRN

## 2023-05-01 NOTE — ED Provider Notes (Signed)
HPI  SUBJECTIVE:  Micheal Roth is a 47 y.o. male who presents with 3 days of anxiety/panic attacks that have become constant today.  He states that his stomach "knots up" and he feels as if he has "butterflies" in.  He reports chest tightness, pressure, heaviness, shortness of breath, sweaty palms, occasional palpitations, nausea, and a feeling of dread.  No chest pain, radiation of this pressure up his neck, down his arm, through to his back.  There is no exertional or positional component to it.  No calf pain, swelling, surgery in the past 4 weeks, hemoptysis, exogenous estrogen.  he states the symptoms started after having 5-6 episodes of watery, nonbloody diarrhea accompanied with 2 episodes of nonbilious, nonbloody emesis.  He denies abdominal pain, fevers, body aches, headaches, nasal congestion, coughing, wheezing, COVID exposure.  He did not get the COVID vaccine.  No recent travel, raw or undercooked foods, recent antibiotics.  No contacts with a diarrheal illness.  He states that he has been under an increased amount of stress recently.  He also reports worsening of his depression with loss of interest in activities he previously enjoyed, disrupted sleep patterns, increased feelings of guilt, crying.  No homicidal, suicidal ideation.  He has not tried anything for symptoms.  No alleviating factors.  Symptoms are worse with thinking about stressful things.  He has a past medical history of depression, and was on an unknown medication at some point.  Also has a past medical history of seizures . No history of anxiety, PE, DVT, cancer, MI, coronary disease, hypertension, diabetes, hypercholesterolemia, CVA, PAD/PVD.  Family history negative for MI.  PCP: He is establishing care with a PCP on September 12 in [place].  He is requesting behavioral health resources, would like someone to talk to.  Past Medical History:  Diagnosis Date   Arthritis    Chronic back pain    Chronic left shoulder  pain    Chronic neck pain    Depression    GERD (gastroesophageal reflux disease)    History of shingles    Migraines    Nausea and vomiting 04/09/2017   Paresthesia of left arm and leg    and weakness of left arm and leg   Seizures (HCC)    not in years    Past Surgical History:  Procedure Laterality Date   BACK SURGERY     KNEE ARTHROSCOPY WITH LATERAL MENISECTOMY Right 05/18/2022   Procedure: KNEE ARTHROSCOPY WITH CHONDROPLASTY  AND DEBRIDEMENT;  Surgeon: Yolonda Kida, MD;  Location: [facility];  Service: Orthopedics;  Laterality: Right;  62   KNEE SURGERY     TUMOR REMOVAL Bilateral 1980   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    Family History  Problem Relation Age of Onset   Diabetes Father    Diabetes Maternal Grandmother    Heart disease Maternal Grandmother        CHF   Heart attack Maternal Grandmother    Crohn's disease Brother    Colon cancer Neg Hx    Colon polyps Neg Hx    Esophageal cancer Neg Hx    Stomach cancer Neg Hx    Rectal cancer Neg Hx     Social History   Tobacco Use   Smoking status: Some Days    Current packs/day: 0.00    Average packs/day: 1 pack/day for 28.3 years (28.3 ttl pk-yrs)    Types: Cigarettes    Start date: 51  Last attempt to quit: 01/02/2022    Years since quitting: 1.3   Smokeless tobacco: Former  Building services engineer status: Former   Substances: Nicotine  Substance Use Topics   Alcohol use: Yes    Comment: Occassionally.   Drug use: Not Currently    Frequency: 2.0 times per week    Types: Marijuana    No current facility-administered medications for this encounter.  Current Outpatient Medications:    escitalopram (LEXAPRO) 5 MG tablet, Take 1 tablet (5 mg total) by mouth daily., Disp: 30 tablet, Rfl: 0   hydrOXYzine (ATARAX) 50 MG tablet, Take 1 tablet (50 mg total) by mouth every 6 (six) hours as needed for anxiety. May take 2 tabs at night, Disp: 30 tablet, Rfl: 1   omeprazole (PRILOSEC OTC) 20  MG tablet, Take 20 mg by mouth daily before breakfast., Disp: , Rfl:    ondansetron (ZOFRAN-ODT) 4 MG disintegrating tablet, Take 1 tablet (4 mg total) by mouth every 8 (eight) hours as needed for nausea or vomiting., Disp: 20 tablet, Rfl: 0  No Known Allergies   ROS  As noted in HPI.   Physical Exam  BP 128/74 (BP Location: Left Arm)   Pulse 80   Temp 98.7 F (37.1 C) (Oral)   Resp 20   Ht 6' (1.829 m)   Wt 104.3 kg   SpO2 97%   BMI 31.19 kg/m   Constitutional: Well developed, well nourished, no acute distress.  Appears anxious. Eyes:  EOMI, conjunctiva normal bilaterally HENT: Normocephalic, atraumatic,mucus membranes moist Respiratory: Normal inspiratory effort, lungs clear bilaterally. Cardiovascular: Normal rate, regular rhythm, no murmurs rubs or gallops GI: nondistended soft, nontender, active bowel sounds.  No rebound, guarding skin: No rash, skin intact Musculoskeletal: no deformities.  Trace edema bilateral lower extremities.  Mild right-sided calf tenderness, patient states that this is not new since knee surgery last year. Neurologic: Alert & oriented x 3, no focal neuro deficits Psychiatric: Speech and behavior appropriate.  Goal oriented thinking.  No apparent homicidal, suicidal ideation.   ED Course   Medications - No data to display  Orders Placed This Encounter  Procedures   EKG 12-Lead    Standing Status:   Standing    Number of Occurrences:   1    No results found for this or any previous visit (from the past 24 hour(s)). No results found.  ED Clinical Impression  1. Anxiety state   2. Depression, unspecified depression type      ED Assessment/Plan     Will check an EKG due to the chest pressure and shortness of breath.  PERC negative.  Doubt PE.  EKG: Normal sinus rhythm, rate 73.  Normal axis, normal intervals.  No hypertrophy.  No ST-T wave changes.  No change compared to EKG from 2018.  Patient symptomatic while EKG was  obtained.  HEART score:   History: Slightly suspicious 0 EKG: EKG normal 0 Age: Below 45 0 Risk factors: 1 risk factor +1 Troponin: Not available  Total score 1.  He is at low risk for ACS.  Suspect anxiety/depression.  He does not remember what medication he was on previously, and states that it did not work anyway.  Will start him on Vistaril, and try Lexapro, will start at a low dose.  Discussed risks of increased suicidality with starting and advised him to go to the ED if this happens.  Will also provide behavioral health resources.  He has follow-up with his primary  care provider on September 12.  Diarrhea/vomiting,.  He does not appear dehydrated, and his abdomen is benign.  Will send home with Zofran.  Discussed  MDM, treatment plan, and plan for follow-up with patient. Discussed sn/sx that should prompt return to the ED. patient agrees with plan.   Meds ordered this encounter  Medications   hydrOXYzine (ATARAX) 50 MG tablet    Sig: Take 1 tablet (50 mg total) by mouth every 6 (six) hours as needed for anxiety. May take 2 tabs at night    Dispense:  30 tablet    Refill:  1   escitalopram (LEXAPRO) 5 MG tablet    Sig: Take 1 tablet (5 mg total) by mouth daily.    Dispense:  30 tablet    Refill:  0   ondansetron (ZOFRAN-ODT) 4 MG disintegrating tablet    Sig: Take 1 tablet (4 mg total) by mouth every 8 (eight) hours as needed for nausea or vomiting.    Dispense:  20 tablet    Refill:  0      *This clinic note was created using Scientist, clinical (histocompatibility and immunogenetics). Therefore, there may be occasional mistakes despite careful proofreading.  ?    Domenick Gong, MD 05/02/23 [phone]

## 2023-05-01 NOTE — ED Triage Notes (Signed)
"  The past few nights I have been getting up with stomach pain/ache with diarrhea at times". "I think I am having panic attacks". "Just talking now I am having sweaty palms and stomach in knots due to anxiety". I have a history of "depression too". I have upcoming appt with "new" PCP.

## 2023-05-01 NOTE — Discharge Instructions (Addendum)
your EKG was normal.  I am starting you on some Vistaril for anxiety.  Lexapro will work for anxiety and depression both.  Make sure you follow-up with your primary care provider as scheduled.  Zofran will help with vomiting and cause constipation, slowing down the diarrhea.  You can try following up with behavioral health.  Also, [facility] has an excellent behavioral health center on [address] that has 24/7 access, counselors in addition to the psychiatrist.  They can further manage your medications.

## 2023-05-15 ENCOUNTER — Ambulatory Visit (HOSPITAL_COMMUNITY)
Admission: EM | Admit: 2023-05-15 | Discharge: 2023-05-15 | Disposition: A | Discharge status: Home / Self Care | Payer: 59 | Attending: Psychiatry | Admitting: Psychiatry

## 2023-05-15 ENCOUNTER — Telehealth (HOSPITAL_COMMUNITY): Payer: Self-pay | Admitting: Licensed Clinical Social Worker

## 2023-05-15 DIAGNOSIS — F4323 Adjustment disorder with mixed anxiety and depressed mood: Secondary | ICD-10-CM | POA: Diagnosis not present

## 2023-05-15 MED ORDER — ESCITALOPRAM OXALATE 10 MG PO TABS: 10.0000 mg | ORAL_TABLET | Freq: Every day | ORAL | 0 refills | Status: DC

## 2023-05-15 NOTE — Progress Notes (Signed)
   05/15/23 0847  [facility] Triage Screening (Walk-ins at [facility] only)  How Did You Hear About [place]? Hospital Discharge  What Is the Reason for Your Visit/Call Today? Pt presents to [facility] voluntarily accompanied by his friend due to increased anxiety and panic attacks for the past 2 weeks. Pt reports stressors from relationship issues, being unhappy with his job, and having to move out of his family home because the family is selling the home. Pt reports he is diganosed with depression but is not prescribed medication. He went to an urgent care on 8/27 for his anxiety and was prescribed hydroxyzine and lexapro. He reports sweating palms, vomiting, diarrhea, heart racing, feeling like there is a pit in his stomach.He is interested in therapy and medication management. Pt denies SI/HI and AVH.  How Long Has This Been Causing You Problems? 1 wk - 1 month  Have You Recently Had Any Thoughts About Hurting Yourself? No  Are You Planning to Commit Suicide/Harm Yourself At This time? No  Have you Recently Had Thoughts About Hurting Someone [name]? No  Are You Planning To Harm Someone At This Time? No  Are you currently experiencing any auditory, visual or other hallucinations? No  Have You Used Any Alcohol or Drugs in the Past 24 Hours? No  Do you have any current medical co-morbidities that require immediate attention? No  Clinician description of patient physical appearance/behavior: nervous, cooperative, casually dressed.  What Do You Feel Would Help You the Most Today? Treatment for Depression or other mood problem  If access to [facility] was not available, would you have sought care in the Emergency Department? No  Determination of Need Routine (7 days)  Options For Referral Outpatient Therapy;Medication Management
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
## 2023-05-15 NOTE — Discharge Instructions (Addendum)
Discharge recommendations:   Medications: Patient is to take medications as prescribed. The patient or patient's guardian is to contact a medical professional and/or outpatient provider to address any new side effects that develop. The patient or the patient's guardian should update outpatient providers of any new medications and/or medication changes.    Outpatient Follow up: Please review list of outpatient resources for psychiatry and counseling. Please follow up with your primary care provider for all medical related needs.    Therapy: We recommend that patient participate in individual therapy to address mental health concerns.   Atypical antipsychotics: If you are prescribed an atypical antipsychotic, it is recommended that your height, weight, BMI, blood pressure, fasting lipid panel, and fasting blood sugar be monitored by your outpatient providers.  Safety:   The following safety precautions should be taken:   No sharp objects. This includes scissors, razors, scrapers, and putty knives.   Chemicals should be removed and locked up.   Medications should be removed and locked up.   Weapons should be removed and locked up. This includes firearms, knives and instruments that can be used to cause injury.   The patient should abstain from use of illicit substances/drugs and abuse of any medications.  If symptoms worsen or do not continue to improve or if the patient becomes actively suicidal or homicidal then it is recommended that the patient return to the closest hospital emergency department, the [facility], or call 911 for further evaluation and treatment. National Suicide Prevention Lifeline 1-800-SUICIDE or 1-800-273-8255.  About 988 988 offers 24/7 access to trained crisis counselors who can help people experiencing mental health-related distress. People can call or text 988 or chat 988lifeline.org for themselves or if they are worried about a  loved one who may need crisis support.         It is imperative that you follow through with treatment recommendations within 5-7 days from the day of discharge to mitigate further risk to your safety and overall mental well-being.  A list of outpatient therapy and psychiatric providers for medication management has been provided below to get you started in finding the right provider for you.            [place]  [facility] [address] [place], [place], [address] [phone] phone (Medicare, Private insurance except Tricare, Blue Local, and Blue Home)  [facility] [address] [place], [place], [address] [phone] phone (Aetna, AmeriHealth [place], BCBS, Cigna, Evernorth, Friday Health Plans, Gateway Health, BCBS Healthy Blue, Humana, Magellan Health, Medcost, Medicare, Medicaid, Optum, Tricare, UHC, UHC Community Plan, Wellcare)  [facility] [address] [place], [place], [address] [phone] phone [phone] phone [phone] fax  [facility] [address] [place], [place], [address] [phone] phone (Call to confirm insurance coverage) Consultation & Support Services     o Drop-In Hours: 1:00 PM to 5:00 PM     o Days: Monday - Thursday  Crisis Services (24/7)   [facility] [address] [place], [place], [address] [phone] phone (Healthy Blue [place], UHC, [place] Medicaid, Vaya and Partners, Humana)      [facility] [address] [place], [place], [address] [phone] phone [email or web address]  (to complete the intake form and upload ID and insurance cards)  [facility] [address] , , [address] [phone] phone (Aetna, Alliance, BCBS,   D[place] Complete Health, Cigna, Medicare, Optum, UHC, Wellcare, and  certain Medicaid plans)  [facility] [address] [place], [place], [address] [phone] phone [phone] fax (Medicaid, Medicare, Self-pay, call about other insurance coverage)  [facility] (age 13+) [address] [place], [place], [address] [phone] hone [phone] fax (Cigna, MedCost, BCBS, Aetna, Magellan, First Health, Healthgram, Humana, Tricare, Multiplan, certain Medicare providers, UHC, UMR)  [facility] [address] [place], [place], [address] [phone] phone (Medicare, Medicaid, Aetna, Cigna, call about other insurance coverage)  [facility] [address] [place], [place], [address] [phone] phone [phone] fax (Call [phone] to see what insurance is accepted) (Gerald Plovsky, MD specializes in geropsych)  [facility]  (medication management only) [address] [place], [place], [address] [phone] phone [phone] fax (Medicare, Medicaid, BCBS, Tricare, Humana, MedCost, Cigna, UHC, UMR)  [facility] (medication management only) [address] [place], [place], [address] [phone] phone [phone] fax (Cigna, Medicare, BCBS, Aetna, Tricare [place])  [facility] [address] [place], [place], [address] [phone] phone [phone] fax (Aetna, BCBS, Cigna, Magellan, MedCost, UHC, [place] Medicaid/[place] Health Choice)  [facility] [address] [place], [place], [address] [phone] phone [phone] fax (Medicare, Medicaid, BCBS)  [facility] [address] [place], [place] [address] [phone] phone (Aetna, BCBS, Cigna, CBHA, MedCost, Medicare, Magellan, UHC) Does genetic testing for medications; does transcranial magnetic stimulation along with basic services)  [facility] [address] [place], [place], [address] [phone] phone (Call about insurance coverage)  [facility] [address] [place], [place], [address] [phone] phone [phone] fax (Call about insurance coverage)  [facility] [address] [place], [place], [address] [phone] phone [phone] fax (Call about insurance coverage)  [facility] [address] [place], [place], [address] [phone] phone (Medicaid, Tricare, BCBS, Aetna, Humana)  [facility] [address] [place], [place], [address] [phone] phone (Medicaid, Medicare, call about other insurance coverage)  [facility] [address] [place], [place], [address] [phone] phone [phone] fax (Medicaid, Medicare, Tricare, call about other insurance coverage)  [facility] [address] [place], [place] , [address] [phone] phone (Aetna, BCBS, Cigna, MedCost, Tricare, Medicaid types - Alliance, AmeriHealth, Partners, Vaya, [place] Health Choice, Healthy Blue, Raven, Wellcare, and Complete)  [facility] [address] [place], [place], [address] [phone] phone Completely online treatment platform Contact: Jonay Argier - Behavioral Health Outreach Specialist [phone] phone [phone] fax (Aetna, BCBS, Cigna, Friday Health Plan, Humana, Magellan, MedCost, Medicaid, Medicare, UBH Optum)                                               

## 2023-05-21 ENCOUNTER — Other Ambulatory Visit (HOSPITAL_COMMUNITY): Payer: 59 | Admitting: Professional

## 2023-05-21 DIAGNOSIS — F411 Generalized anxiety disorder: Secondary | ICD-10-CM | POA: Insufficient documentation

## 2023-05-21 DIAGNOSIS — F332 Major depressive disorder, recurrent severe without psychotic features: Secondary | ICD-10-CM | POA: Insufficient documentation
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
## 2023-05-22 ENCOUNTER — Other Ambulatory Visit (HOSPITAL_COMMUNITY): Payer: 59

## 2023-05-23 ENCOUNTER — Other Ambulatory Visit (HOSPITAL_COMMUNITY): Payer: 59

## 2023-05-24 ENCOUNTER — Other Ambulatory Visit (HOSPITAL_COMMUNITY): Payer: 59 | Attending: Psychiatry | Admitting: Licensed Clinical Social Worker

## 2023-05-24 ENCOUNTER — Other Ambulatory Visit (HOSPITAL_COMMUNITY): Payer: 59 | Attending: Psychiatry

## 2023-05-24 ENCOUNTER — Telehealth (HOSPITAL_COMMUNITY): Payer: Self-pay | Admitting: Licensed Clinical Social Worker

## 2023-05-24 DIAGNOSIS — F411 Generalized anxiety disorder: Secondary | ICD-10-CM | POA: Insufficient documentation

## 2023-05-24 DIAGNOSIS — F419 Anxiety disorder, unspecified: Secondary | ICD-10-CM | POA: Diagnosis not present

## 2023-05-24 DIAGNOSIS — Z79899 Other long term (current) drug therapy: Secondary | ICD-10-CM | POA: Insufficient documentation

## 2023-05-24 DIAGNOSIS — F17210 Nicotine dependence, cigarettes, uncomplicated: Secondary | ICD-10-CM | POA: Insufficient documentation

## 2023-05-24 DIAGNOSIS — F332 Major depressive disorder, recurrent severe without psychotic features: Secondary | ICD-10-CM | POA: Diagnosis present

## 2023-05-24 DIAGNOSIS — F4320 Adjustment disorder, unspecified: Secondary | ICD-10-CM | POA: Insufficient documentation

## 2023-05-24 DIAGNOSIS — Z638 Other specified problems related to primary support group: Secondary | ICD-10-CM | POA: Insufficient documentation

## 2023-05-24 DIAGNOSIS — Z62819 Personal history of unspecified abuse in childhood: Secondary | ICD-10-CM | POA: Diagnosis not present

## 2023-05-24 DIAGNOSIS — R4589 Other symptoms and signs involving emotional state: Secondary | ICD-10-CM | POA: Insufficient documentation

## 2023-05-25 ENCOUNTER — Encounter (HOSPITAL_COMMUNITY): Payer: Self-pay

## 2023-05-25 ENCOUNTER — Other Ambulatory Visit (HOSPITAL_COMMUNITY): Payer: 59 | Admitting: Licensed Clinical Social Worker

## 2023-05-25 ENCOUNTER — Encounter (HOSPITAL_COMMUNITY): Payer: Self-pay | Admitting: Family

## 2023-05-25 ENCOUNTER — Other Ambulatory Visit (HOSPITAL_COMMUNITY): Payer: 59

## 2023-05-25 DIAGNOSIS — F332 Major depressive disorder, recurrent severe without psychotic features: Secondary | ICD-10-CM

## 2023-05-25 DIAGNOSIS — F411 Generalized anxiety disorder: Secondary | ICD-10-CM

## 2023-05-25 DIAGNOSIS — R4589 Other symptoms and signs involving emotional state: Secondary | ICD-10-CM

## 2023-05-25 NOTE — Therapy (Signed)
GOAL #3   Title Pt will choose and/or engage in 1-3 socially engaging leisure activities to improve social participation skills upon reintegrating into community    Status On-going                      Plan - 05/25/23 1126     Clinical Impression Statement Pt presents w/ deficits in multiple psychosocial domains that inhibit daily function and overall occupational performance.    OT Occupational Profile and History Problem Focused Assessment - Including review of records relating to presenting problem    Occupational performance deficits (Please refer to evaluation for details): Work;Social Participation;Rest and Sleep;Leisure    Rehab Potential Good    Clinical Decision Making Limited treatment options, no task modification necessary    Comorbidities Affecting Occupational Performance: None    Modification or Assistance to Complete Evaluation  No modification of tasks or assist necessary to complete eval    OT Frequency 5x / week    OT Duration 4 weeks    OT Treatment/Interventions Psychosocial skills training;Coping strategies training    Consulted and Agree with Plan of Care Patient             Patient will benefit from skilled  therapeutic intervention in order to improve the following deficits and impairments:           Visit Diagnosis: Difficulty coping  Severe episode of recurrent major depressive disorder, without psychotic features (HCC)  GAD (generalized anxiety disorder)    Problem List Patient Active Problem List   Diagnosis Date Noted   MDD (major depressive disorder), recurrent episode, severe (HCC) 05/21/2023   GAD (generalized anxiety disorder) 05/21/2023   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   Ocular pain, right eye-  actually periorbital 01/23/2018   Exertional epigastric/chest pain 05/03/2017   H/O: substance abuse (HCC) 05/03/2017   Nausea and vomiting 04/09/2017   Elevated triglycerides with high cholesterol 03/22/2017   Low serum HDL 03/22/2017   Myalgia 03/22/2017   Obesity, Class I, BMI 30-34.9 02/22/2017   Drug abuse, smokes marijuana qd 02/22/2017   Chronic heartburn 02/22/2017   Epigastric abdominal pain 02/22/2017   h/o Seizures (HCC) 04/26/2016   History of many Head concussions 04/26/2016   h/o Chronic neck pain 04/26/2016   History of shingles 04/26/2016   Neuralgia, postherpetic- R L Ext 04/26/2016   Edema of right lower extremity 04/26/2016   h/o Weakness of left arm 07/14/2013   h/o Paresthesia of left arm 07/14/2013   h/o Neck pain on left side 03/17/2013   h/o Pain in joint, R shoulder region 03/17/2013   Acute confusional state 12/18/2012   h/o TOBACCO USER 05/10/2009   h/o Migraine 02/15/2009   h/o chronic LOW BACK PAIN 07/10/2008   Adjustment disorder with mixed anxiety and depressed mood 07/04/2007    Ted Mcalpine, OT 05/25/2023, 11:28 AM  [facility] HOSPITALIZATION PROGRAM [address] [place], [place], [address] Phone: [phone]   Fax:  [phone]  Name: Micheal Roth MRN: [id number] Date of Birth: 1975/12/26  GOAL #3   Title Pt will choose and/or engage in 1-3 socially engaging leisure activities to improve social participation skills upon reintegrating into community    Status On-going                      Plan - 05/25/23 1126     Clinical Impression Statement Pt presents w/ deficits in multiple psychosocial domains that inhibit daily function and overall occupational performance.    OT Occupational Profile and History Problem Focused Assessment - Including review of records relating to presenting problem    Occupational performance deficits (Please refer to evaluation for details): Work;Social Participation;Rest and Sleep;Leisure    Rehab Potential Good    Clinical Decision Making Limited treatment options, no task modification necessary    Comorbidities Affecting Occupational Performance: None    Modification or Assistance to Complete Evaluation  No modification of tasks or assist necessary to complete eval    OT Frequency 5x / week    OT Duration 4 weeks    OT Treatment/Interventions Psychosocial skills training;Coping strategies training    Consulted and Agree with Plan of Care Patient             Patient will benefit from skilled  therapeutic intervention in order to improve the following deficits and impairments:           Visit Diagnosis: Difficulty coping  Severe episode of recurrent major depressive disorder, without psychotic features (HCC)  GAD (generalized anxiety disorder)    Problem List Patient Active Problem List   Diagnosis Date Noted   MDD (major depressive disorder), recurrent episode, severe (HCC) 05/21/2023   GAD (generalized anxiety disorder) 05/21/2023   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   Ocular pain, right eye-  actually periorbital 01/23/2018   Exertional epigastric/chest pain 05/03/2017   H/O: substance abuse (HCC) 05/03/2017   Nausea and vomiting 04/09/2017   Elevated triglycerides with high cholesterol 03/22/2017   Low serum HDL 03/22/2017   Myalgia 03/22/2017   Obesity, Class I, BMI 30-34.9 02/22/2017   Drug abuse, smokes marijuana qd 02/22/2017   Chronic heartburn 02/22/2017   Epigastric abdominal pain 02/22/2017   h/o Seizures (HCC) 04/26/2016   History of many Head concussions 04/26/2016   h/o Chronic neck pain 04/26/2016   History of shingles 04/26/2016   Neuralgia, postherpetic- R L Ext 04/26/2016   Edema of right lower extremity 04/26/2016   h/o Weakness of left arm 07/14/2013   h/o Paresthesia of left arm 07/14/2013   h/o Neck pain on left side 03/17/2013   h/o Pain in joint, R shoulder region 03/17/2013   Acute confusional state 12/18/2012   h/o TOBACCO USER 05/10/2009   h/o Migraine 02/15/2009   h/o chronic LOW BACK PAIN 07/10/2008   Adjustment disorder with mixed anxiety and depressed mood 07/04/2007    Ted Mcalpine, OT 05/25/2023, 11:28 AM  [facility] HOSPITALIZATION PROGRAM [address] [place], [place], [address] Phone: [phone]   Fax:  [phone]  Name: Micheal Roth MRN: [id number] Date of Birth: 1975/12/26  [facility] PARTIAL HOSPITALIZATION PROGRAM [address] [place], [place], [address] Phone: [phone]   Fax:  [phone]  Occupational Therapy Evaluation Virtual Visit via Video Note  I connected with Micheal Roth on 05/25/23 at  8:00 AM EDT by a video enabled telemedicine application and verified that I am speaking with the correct person using two identifiers.  Location: Patient: home Provider: office   I discussed the limitations of evaluation and management by telemedicine and the availability of in person appointments. The patient expressed understanding and agreed to proceed.    The patient was advised to call back or seek an in-person evaluation if the symptoms worsen or if the condition fails to improve as anticipated.  I provided 85 minutes of non-face-to-face time during this encounter.   Patient Details  Name: Micheal Roth MRN: [id number] Date of Birth: Feb 24, 1976 No data recorded  Encounter Date: 05/24/2023   OT End of Session - 05/25/23 1125     Visit Number 1    Number of Visits 20    Date for OT Re-Evaluation 06/24/23    OT Start Time 1000    OT Stop Time 1255   ev: 30; Tx: 55   OT Time Calculation (min) 175 min    Activity Tolerance Patient tolerated treatment well             Past Medical History:  Diagnosis Date   Arthritis    Chronic back pain    Chronic left shoulder pain    Chronic neck pain    Depression    GERD (gastroesophageal reflux disease)    History of shingles    Migraines    Nausea and vomiting 04/09/2017   Paresthesia of left arm and leg    and weakness of left arm and leg   Seizures (HCC)    not in years    Past Surgical History:  Procedure Laterality Date   BACK SURGERY     KNEE ARTHROSCOPY WITH LATERAL MENISECTOMY Right 05/18/2022   Procedure: KNEE ARTHROSCOPY WITH CHONDROPLASTY  AND DEBRIDEMENT;  Surgeon: Yolonda Kida, MD;  Location: [facility];  Service: Orthopedics;   Laterality: Right;  62   KNEE SURGERY     TUMOR REMOVAL Bilateral 1980   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   Subjective Assessment - 05/25/23 1124     Subjective  I'd like to learn new coping skills and ways to manage my anxirty.    Pertinent History MDD, GAD    Currently in Pain? No/denies    Pain Score 0-No pain    Multiple Pain Sites No                Group Session:  O: In this group therapy session, the objective was to explore the multifaceted concept of purpose. The participants engaged in a deep exploration of the innate human impulse for purposeful engagement and the impact of purpose on mental and emotional well-being. The discussion centered around the challenges of modern life, including feelings of isolation despite technological advancements, the connection between purposelessness and depression/anxiety, and the importance of occupational roles in achieving fulfillment. Strategies were shared to identify areas of life where purpose is lacking and to develop immediate and actionable plans to overcome these challenges. The participants gained valuable insights into the intersection of purpose and mental health, as well as practical tools to navigate their personal journeys
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
## 2023-05-28 ENCOUNTER — Telehealth (HOSPITAL_COMMUNITY): Payer: Self-pay | Admitting: Licensed Clinical Social Worker

## 2023-05-28 ENCOUNTER — Other Ambulatory Visit (HOSPITAL_COMMUNITY): Payer: 59

## 2023-05-29 ENCOUNTER — Other Ambulatory Visit (HOSPITAL_COMMUNITY): Payer: 59 | Admitting: Licensed Clinical Social Worker

## 2023-05-29 ENCOUNTER — Encounter (HOSPITAL_COMMUNITY): Payer: Self-pay

## 2023-05-29 ENCOUNTER — Other Ambulatory Visit (HOSPITAL_COMMUNITY): Payer: 59

## 2023-05-29 DIAGNOSIS — F332 Major depressive disorder, recurrent severe without psychotic features: Secondary | ICD-10-CM

## 2023-05-29 DIAGNOSIS — F411 Generalized anxiety disorder: Secondary | ICD-10-CM

## 2023-05-29 DIAGNOSIS — R4589 Other symptoms and signs involving emotional state: Secondary | ICD-10-CM

## 2023-05-29 NOTE — Therapy (Signed)
[facility] PARTIAL HOSPITALIZATION PROGRAM [address] [place], [place], [address] Phone: [phone]   Fax:  [phone]  Occupational Therapy Treatment Virtual Visit via Video Note  I connected with Micheal Roth on 05/29/23 at  8:00 AM EDT by a video enabled telemedicine application and verified that I am speaking with the correct person using two identifiers.  Location: Patient: home Provider: office   I discussed the limitations of evaluation and management by telemedicine and the availability of in person appointments. The patient expressed understanding and agreed to proceed.    The patient was advised to call back or seek an in-person evaluation if the symptoms worsen or if the condition fails to improve as anticipated.  I provided 55 minutes of non-face-to-face time during this encounter.   Patient Details  Name: Micheal Roth MRN: [id number] Date of Birth: 08/02/1976 No data recorded  Encounter Date: 05/25/2023   OT End of Session - 05/29/23 0845     Visit Number 2    Number of Visits 20    Date for OT Re-Evaluation 06/24/23    OT Start Time 1200    OT Stop Time 1255    OT Time Calculation (min) 55 min             Past Medical History:  Diagnosis Date   Arthritis    Chronic back pain    Chronic left shoulder pain    Chronic neck pain    Depression    GERD (gastroesophageal reflux disease)    History of shingles    Migraines    Nausea and vomiting 04/09/2017   Paresthesia of left arm and leg    and weakness of left arm and leg   Seizures (HCC)    not in years    Past Surgical History:  Procedure Laterality Date   BACK SURGERY     KNEE ARTHROSCOPY WITH LATERAL MENISECTOMY Right 05/18/2022   Procedure: KNEE ARTHROSCOPY WITH CHONDROPLASTY  AND DEBRIDEMENT;  Surgeon: Yolonda Kida, MD;  Location: [facility];  Service: Orthopedics;  Laterality: Right;  62   KNEE SURGERY     TUMOR REMOVAL Bilateral 1980    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   Subjective Assessment - 05/29/23 0844     Currently in Pain? No/denies    Pain Score 0-No pain                Group Session:  S: Doing okay today.   O: In this group therapy session, the objective was to explore the multifaceted concept of purpose. The participants engaged in a deep exploration of the innate human impulse for purposeful engagement and the impact of purpose on mental and emotional well-being. The discussion centered around the challenges of modern life, including feelings of isolation despite technological advancements, the connection between purposelessness and depression/anxiety, and the importance of occupational roles in achieving fulfillment. Strategies were shared to identify areas of life where purpose is lacking and to develop immediate and actionable plans to overcome these challenges. The participants gained valuable insights into the intersection of purpose and mental health, as well as practical tools to navigate their personal journeys towards purpose-driven lives. Overall, the session fostered a sense of empowerment and inspired the participants to take proactive steps towards aligning their lives with their true purpose.   A: Patient demonstrated a high level of engagement and active participation.  They actively contributed to the discussions, sharing personal insights and experiences related to the concept of purpose. Patient demonstrated a clear understanding of the relationship between purpose and mental well-being, acknowledging the importance of aligning actions with values and setting meaningful goals. They actively engaged in self-reflection exercises and expressed a commitment to incorporating strategies discussed in the session into their daily life. Patient exhibited a strong motivation to cultivate purpose and showed a willingness to take proactive  steps towards living a more purposeful and fulfilling life. Overall, Patient's active participation and enthusiasm contributed to a positive and collaborative therapeutic environment.   P: Continue to attend PHP OT group sessions 5x week for 4 weeks to promote daily structure, social engagement, and opportunities to develop and utilize adaptive strategies to maximize functional performance in preparation for safe transition and integration back into school, work, and the community. Plan to address topic of SMART Goals in next OT group session.                   OT Education - 05/29/23 0844     Education Details Purpose              OT Short Term Goals - 05/25/23 1127       OT SHORT TERM GOAL #1   Title Patient will be educated on strategies to improve psychosocial skills needed to participate fully in all daily, work, and leisure activities.    Time 4    Period Weeks    Status On-going    Target Date 06/24/23      OT SHORT TERM GOAL #2   Title Pt will apply psychosocial skills and coping mechanisms to daily activities in order to function independently and reintegrate into community dwelling    Status On-going      OT SHORT TERM GOAL #3   Title Pt will choose and/or engage in 1-3 socially engaging leisure activities to improve social participation skills upon reintegrating into community    Status On-going                      Plan - 05/29/23 0845     Occupational performance deficits (Please refer to evaluation for details): Work;Social Participation;Rest and Sleep;Leisure    Psychosocial Skills Coping Strategies;Habits;Routines and Behaviors;Interpersonal Interaction             Patient will benefit from skilled therapeutic intervention in order to improve the following deficits and impairments:       Psychosocial Skills: Coping Strategies, Habits, Routines and Behaviors, Interpersonal Interaction   Visit Diagnosis: Difficulty  coping    Problem List Patient Active Problem List   Diagnosis Date Noted   MDD (major depressive disorder), recurrent episode, severe (HCC) 05/21/2023   GAD (generalized anxiety disorder) 05/21/2023   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   Ocular pain, right eye-  actually periorbital 01/23/2018   Exertional epigastric/chest pain 05/03/2017   H/O: substance abuse (HCC) 05/03/2017   Nausea and vomiting 04/09/2017   Elevated triglycerides with high cholesterol 03/22/2017   Low serum HDL 03/22/2017   Myalgia 03/22/2017   Obesity, Class I, BMI 30-34.9 02/22/2017   Drug abuse, smokes marijuana qd 02/22/2017   Chronic heartburn 02/22/2017   Epigastric abdominal pain 02/22/2017   h/o Seizures (HCC) 04/26/2016   History of many Head concussions 04/26/2016   h/o Chronic neck pain 04/26/2016   History of shingles 04/26/2016   Neuralgia, postherpetic- R L Ext 04/26/2016   Edema of right lower extremity  04/26/2016   h/o Weakness of left arm 07/14/2013   h/o Paresthesia of left arm 07/14/2013   h/o Neck pain on left side 03/17/2013   h/o Pain in joint, R shoulder region 03/17/2013   Acute confusional state 12/18/2012   h/o TOBACCO USER 05/10/2009   h/o Migraine 02/15/2009   h/o chronic LOW BACK PAIN 07/10/2008   Adjustment disorder with mixed anxiety and depressed mood 07/04/2007    Ted Mcalpine, OT 05/29/2023, 8:46 AM  Kerrin Champagne, OT   [facility] PROGRAM [address] [place], [place], [address] Phone: [phone]   Fax:  [phone]  Name: Micheal Roth MRN: [id number] Date of Birth: 1975/10/11

## 2023-05-30 ENCOUNTER — Other Ambulatory Visit (HOSPITAL_COMMUNITY): Payer: 59 | Admitting: Licensed Clinical Social Worker

## 2023-05-30 ENCOUNTER — Other Ambulatory Visit (HOSPITAL_COMMUNITY): Payer: 59

## 2023-05-30 ENCOUNTER — Encounter (HOSPITAL_COMMUNITY): Payer: Self-pay

## 2023-05-30 DIAGNOSIS — F411 Generalized anxiety disorder: Secondary | ICD-10-CM

## 2023-05-30 DIAGNOSIS — F332 Major depressive disorder, recurrent severe without psychotic features: Secondary | ICD-10-CM | POA: Diagnosis not present

## 2023-05-30 DIAGNOSIS — R4589 Other symptoms and signs involving emotional state: Secondary | ICD-10-CM

## 2023-05-30 NOTE — Progress Notes (Signed)
Spoke with patient via Teams video call, used 2 identifiers to correctly identify patient. States this is his first time in [facility] as referred by [facility] for depression/anxiety. Has been having a lot of stomach issues related to anxiety. Stresses over a lot of things. He lives in a house that was recently sold and he has until December to move out and find a new home. Does not like his job anymore. Has dealt with depression since he was 71 or 48 years old. On scale 1-10 as 10 being worst he rates depression at 6 and anxiety at 7. Denies SI/HI or AV hallucinations. No side effects from medications. No issues or complaints. Groups are going well.  PHQ9=21.

## 2023-05-31 ENCOUNTER — Other Ambulatory Visit (HOSPITAL_COMMUNITY): Payer: 59 | Admitting: Licensed Clinical Social Worker

## 2023-05-31 ENCOUNTER — Encounter (HOSPITAL_COMMUNITY): Payer: Self-pay

## 2023-05-31 ENCOUNTER — Other Ambulatory Visit (HOSPITAL_COMMUNITY): Payer: 59

## 2023-05-31 DIAGNOSIS — F411 Generalized anxiety disorder: Secondary | ICD-10-CM

## 2023-05-31 DIAGNOSIS — F332 Major depressive disorder, recurrent severe without psychotic features: Secondary | ICD-10-CM

## 2023-05-31 DIAGNOSIS — R4589 Other symptoms and signs involving emotional state: Secondary | ICD-10-CM

## 2023-05-31 NOTE — Therapy (Signed)
[facility] PARTIAL HOSPITALIZATION PROGRAM [address] [place], [place], [address] Phone: [phone]   Fax:  [phone]  Occupational Therapy Treatment Virtual Visit via Video Note  I connected with Si Raider Imm on 05/31/23 at  8:00 AM EDT by a video enabled telemedicine application and verified that I am speaking with the correct person using two identifiers.  Location: Patient: home Provider: office   I discussed the limitations of evaluation and management by telemedicine and the availability of in person appointments. The patient expressed understanding and agreed to proceed.    The patient was advised to call back or seek an in-person evaluation if the symptoms worsen or if the condition fails to improve as anticipated.  I provided 55 minutes of non-face-to-face time during this encounter.   Patient Details  Name: Micheal Roth MRN: [id number] Date of Birth: [birth year] No data recorded  Encounter Date: 05/29/2023   OT End of Session - 05/31/23 1008     Visit Number 3    Number of Visits 20    Date for OT Re-Evaluation 06/24/23    OT Start Time 1200    OT Stop Time 1255    OT Time Calculation (min) 55 min             Past Medical History:  Diagnosis Date   Arthritis    Chronic back pain    Chronic left shoulder pain    Chronic neck pain    Depression    GERD (gastroesophageal reflux disease)    History of shingles    Migraines    Nausea and vomiting 04/09/2017   Paresthesia of left arm and leg    and weakness of left arm and leg   Seizures (HCC)    not in years    Past Surgical History:  Procedure Laterality Date   BACK SURGERY     KNEE ARTHROSCOPY WITH LATERAL MENISECTOMY Right 05/18/2022   Procedure: KNEE ARTHROSCOPY WITH CHONDROPLASTY  AND DEBRIDEMENT;  Surgeon: Yolonda Kida, MD;  Location: [facility];  Service: Orthopedics;  Laterality: Right;  62   KNEE SURGERY     TUMOR REMOVAL Bilateral 1980    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   Subjective Assessment - 05/31/23 1008     Currently in Pain? No/denies    Pain Score 0-No pain               Group Session:  S: Doing a bit better today I believe   O: During today's OT group session, the patient participated in an educational segment about the importance of goal-setting and the application of the SMART framework to enhance daily life, particularly focusing on ADLs and iADLs. The session began with five open-ended pre-session questions that facilitated group discussion and introspection about their current relationship with goals. Following the introduction and educational segment, participants engaged in brainstorming and group discussions to devise hypothetical SMART goals. The session concluded with five post-session questions to reinforce understanding and facilitate reflection. Throughout the session, there was a range of engagement levels noted among the participants.   A:  Patient was present during the session but displayed passive engagement. They listened to the discussions but contributed minimally when prompted. When asked about personal experiences or thoughts related to the topic, their responses were brief and lacked depth. It was challenging to ascertain their understanding or the potential application of the  Ted Mcalpine, OT 05/31/2023, 10:09 AM  Kerrin Champagne, OT   [facility] [address] [place], [place], [address] Phone: [phone]   Fax:  [phone]  Name: DAISHAWN SALAMA MRN: [id number] Date of Birth: 07/24/1976  Ted Mcalpine, OT 05/31/2023, 10:09 AM  Kerrin Champagne, OT   [facility] [address] [place], [place], [address] Phone: [phone]   Fax:  [phone]  Name: DAISHAWN SALAMA MRN: [id number] Date of Birth: 07/24/1976

## 2023-05-31 NOTE — Therapy (Signed)
[facility] PARTIAL HOSPITALIZATION PROGRAM [address] [place], [place], [address] Phone: [phone]   Fax:  [phone]  Occupational Therapy Treatment Virtual Visit via Video Note  I connected with Si Raider Lile on 05/31/23 at  8:00 AM EDT by a video enabled telemedicine application and verified that I am speaking with the correct person using two identifiers.  Location: Patient: home Provider: office   I discussed the limitations of evaluation and management by telemedicine and the availability of in person appointments. The patient expressed understanding and agreed to proceed.    The patient was advised to call back or seek an in-person evaluation if the symptoms worsen or if the condition fails to improve as anticipated.  I provided 55 minutes of non-face-to-face time during this encounter.   Patient Details  Name: Micheal Roth MRN: [id number] Date of Birth: 01/08/1976 No data recorded  Encounter Date: 05/30/2023   OT End of Session - 05/31/23 1012     Visit Number 4    Number of Visits 20    Date for OT Re-Evaluation 06/24/23    OT Start Time 1200    OT Stop Time 1255    OT Time Calculation (min) 55 min             Past Medical History:  Diagnosis Date   Arthritis    Chronic back pain    Chronic left shoulder pain    Chronic neck pain    Depression    GERD (gastroesophageal reflux disease)    History of shingles    Migraines    Nausea and vomiting 04/09/2017   Paresthesia of left arm and leg    and weakness of left arm and leg   Seizures (HCC)    not in years    Past Surgical History:  Procedure Laterality Date   BACK SURGERY     KNEE ARTHROSCOPY WITH LATERAL MENISECTOMY Right 05/18/2022   Procedure: KNEE ARTHROSCOPY WITH CHONDROPLASTY  AND DEBRIDEMENT;  Surgeon: Yolonda Kida, MD;  Location: [facility];  Service: Orthopedics;  Laterality: Right;  62   KNEE SURGERY     TUMOR REMOVAL Bilateral 1980    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   Subjective Assessment - 05/31/23 1012     Currently in Pain? No/denies    Pain Score 0-No pain                 Group Session:  S: Feeling better today.   O: The objective of the telehealth group therapy session was to discuss the significance of routines in promoting mental health and wellbeing. The OT aimed to explore the power of routines in providing structure, stability, and predictability in individuals' lives, as well as their role in establishing healthy habits, reducing stress and anxiety, managing time effectively, achieving goals, and fostering a sense of community and social connectedness.   Participants were guided to identify areas where routines could improve their mental health and were provided with tips for establishing and maintaining healthy routines. The session concluded by emphasizing the potential of routines to enhance overall quality of life.  Homework Assignment:  As part of the session, participants were assigned a homework task to reflect on their current routines and select one area of their lives where they could establish a new routine to improve their mental health and wellbeing. They were instructed to start small and  04/26/2016   Edema of right lower extremity 04/26/2016   h/o Weakness of left arm 07/14/2013   h/o Paresthesia of left arm 07/14/2013   h/o Neck pain on left side 03/17/2013   h/o Pain in joint, R shoulder region 03/17/2013   Acute confusional state 12/18/2012   h/o TOBACCO USER 05/10/2009   h/o Migraine 02/15/2009   h/o chronic LOW BACK PAIN 07/10/2008   Adjustment disorder with mixed anxiety and depressed mood 07/04/2007    Ted Mcalpine, OT 05/31/2023, 10:13 AM  Kerrin Champagne, OT   [facility] HOSPITALIZATION PROGRAM [address] [place], [place], [address] Phone: [phone]   Fax:  [phone]  Name: TREYVON VENUTI MRN: [id number] Date of Birth: Jan 25, 1976  [facility] PARTIAL HOSPITALIZATION PROGRAM [address] [place], [place], [address] Phone: [phone]   Fax:  [phone]  Occupational Therapy Treatment Virtual Visit via Video Note  I connected with Si Raider Lile on 05/31/23 at  8:00 AM EDT by a video enabled telemedicine application and verified that I am speaking with the correct person using two identifiers.  Location: Patient: home Provider: office   I discussed the limitations of evaluation and management by telemedicine and the availability of in person appointments. The patient expressed understanding and agreed to proceed.    The patient was advised to call back or seek an in-person evaluation if the symptoms worsen or if the condition fails to improve as anticipated.  I provided 55 minutes of non-face-to-face time during this encounter.   Patient Details  Name: Micheal Roth MRN: [id number] Date of Birth: 01/08/1976 No data recorded  Encounter Date: 05/30/2023   OT End of Session - 05/31/23 1012     Visit Number 4    Number of Visits 20    Date for OT Re-Evaluation 06/24/23    OT Start Time 1200    OT Stop Time 1255    OT Time Calculation (min) 55 min             Past Medical History:  Diagnosis Date   Arthritis    Chronic back pain    Chronic left shoulder pain    Chronic neck pain    Depression    GERD (gastroesophageal reflux disease)    History of shingles    Migraines    Nausea and vomiting 04/09/2017   Paresthesia of left arm and leg    and weakness of left arm and leg   Seizures (HCC)    not in years    Past Surgical History:  Procedure Laterality Date   BACK SURGERY     KNEE ARTHROSCOPY WITH LATERAL MENISECTOMY Right 05/18/2022   Procedure: KNEE ARTHROSCOPY WITH CHONDROPLASTY  AND DEBRIDEMENT;  Surgeon: Yolonda Kida, MD;  Location: [facility];  Service: Orthopedics;  Laterality: Right;  62   KNEE SURGERY     TUMOR REMOVAL Bilateral 1980    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   Subjective Assessment - 05/31/23 1012     Currently in Pain? No/denies    Pain Score 0-No pain                 Group Session:  S: Feeling better today.   O: The objective of the telehealth group therapy session was to discuss the significance of routines in promoting mental health and wellbeing. The OT aimed to explore the power of routines in providing structure, stability, and predictability in individuals' lives, as well as their role in establishing healthy habits, reducing stress and anxiety, managing time effectively, achieving goals, and fostering a sense of community and social connectedness.   Participants were guided to identify areas where routines could improve their mental health and were provided with tips for establishing and maintaining healthy routines. The session concluded by emphasizing the potential of routines to enhance overall quality of life.  Homework Assignment:  As part of the session, participants were assigned a homework task to reflect on their current routines and select one area of their lives where they could establish a new routine to improve their mental health and wellbeing. They were instructed to start small and

## 2023-06-01 ENCOUNTER — Other Ambulatory Visit (HOSPITAL_COMMUNITY): Payer: 59

## 2023-06-01 ENCOUNTER — Other Ambulatory Visit (HOSPITAL_COMMUNITY): Payer: 59 | Admitting: Licensed Clinical Social Worker

## 2023-06-01 DIAGNOSIS — F332 Major depressive disorder, recurrent severe without psychotic features: Secondary | ICD-10-CM | POA: Diagnosis not present

## 2023-06-01 DIAGNOSIS — F411 Generalized anxiety disorder: Secondary | ICD-10-CM

## 2023-06-03 NOTE — Psych (Signed)
Virtual Visit via Video Note  I connected with Micheal Roth on 05/29/23 at  9:00 AM EDT by a video enabled telemedicine application and verified that I am speaking with the correct person using two identifiers.  Location: Patient: patient home Provider: clinical home office   I discussed the limitations of evaluation and management by telemedicine and the availability of in person appointments. The patient expressed understanding and agreed to proceed.  I discussed the assessment and treatment plan with the patient. The patient was provided an opportunity to ask questions and all were answered. The patient agreed with the plan and demonstrated an understanding of the instructions.   The patient was advised to call back or seek an in-person evaluation if the symptoms worsen or if the condition fails to improve as anticipated.  Pt was provided 240 minutes of non-face-to-face time during this encounter.   Donia Guiles, LCSW   [facility] PHP THERAPIST PROGRESS NOTE  Micheal Roth [id number]  Session Time: 9:00 - 10:00  Participation Level: Active  Behavioral Response: CasualAlertAnxious  Type of Therapy: Group Therapy  Treatment Goals addressed: Coping  Progress Towards Goals: Initial  Interventions: CBT, DBT, Supportive, and Reframing  Summary: Micheal Roth is a 47 y.o. male who presents with depression and anxiety symptoms.  Clinician led check-in regarding current stressors and situation, and review of patient completed daily inventory. Clinician utilized active listening and empathetic response and validated patient emotions. Clinician facilitated processing group on pertinent issues.?    Therapist Response: Patient arrived within time allowed. Patient rates his mood at a 5 on a scale of 1-10 with 10 being best. Pt states he feels "blah." Pt states he slept 6 hours and ate 1x yesterday. Pt reports he has an ear infection and will need to see an ENT. Pt  reports sleeping most of the day yesterday due to feeling poorly. Pt shares he spent time with his brother over the weekend and it went well. Pt reports conflictual relationship with long time girlfriend and increased stress as of late. Pt reports continued restlessness and increased movement/fidgeting.  Patient able to process. Patient engaged in discussion.      Session Time: 10:00 am - 11:00 am   Participation Level: Active   Behavioral Response: CasualAlertAnxious and Depressed   Type of Therapy: Group Therapy   Treatment Goals addressed: Coping   Progress Towards Goals: Progressing   Interventions: CBT, DBT, Solution Focused, Strength-based, Supportive, and Reframing   Therapist Response: Cln introduced DBT distress tolerance distraction skills. Cln provides context for distraction skills and why distraction is a foundational way to manage mood dysregulation. Group discussed how to apply distraction.    Therapist Response:  Pt engaged in discussion and reports understanding of how distraction can be applied to manage big feelings.         Session Time: 11:00 -12:00   Participation Level: Active   Behavioral Response: CasualAlertDepressed   Type of Therapy: Group Therapy   Treatment Goals addressed: Coping   Progress Towards Goals: Progressing   Interventions: CBT, DBT, Solution Focused, Strength-based, Supportive, and Reframing   Summary: Cln continued topic of DBT distress tolerance skills. Cln introduced the ACCEPTS distraction skills and group discusses ways to utilize "A" activities.    Therapist Response: Pt engaged in discussion and is able to state ways to apply this skill.          Session Time: 12:00 -1:00   Participation Level: Active   Behavioral Response: CasualAlertDepressed  Type of Therapy: Group therapy   Treatment Goals addressed: Coping   Progress Towards Goals: Progressing   Interventions: OT group   Summary: 12:00 - 12:50:  Occupational Therapy group with cln E. Hollan.  12:50 - 1:00 Clinician assessed for immediate needs, medication compliance and efficacy, and safety concerns.   Therapist Response: 12:00 - 12:50: See note 12:50 - 1:00 pm: At check-out, patient reports no immediate concerns. Patient demonstrates progress as evidenced by continued engagement and responsiveness to treatment. Patient denies SI/HI/self-harm thoughts at the end of group.     Suicidal/Homicidal: Nowithout intent/plan  Plan: Pt will continue in PHP while working to decrease depression and anxiety symptoms, increase daily functioning, and in crease ability to manage symptoms in a healthy manner.   Collaboration of Care: Medication Management AEB T Lewis  Patient/Guardian was advised Release of Information must be obtained prior to any record release in order to collaborate their care with an outside provider. Patient/Guardian was advised if they have not already done so to contact the registration department to sign all necessary forms in order for [place] to release information regarding their care.   Consent: Patient/Guardian gives verbal consent for treatment and assignment of benefits for services provided during this visit. Patient/Guardian expressed understanding and agreed to proceed.   Diagnosis: Severe episode of recurrent major depressive disorder, without psychotic features (HCC) [F33.2]    1. Severe episode of recurrent major depressive disorder, without psychotic features (HCC)   2. GAD (generalized anxiety disorder)       Donia Guiles, LCSW

## 2023-06-03 NOTE — Psych (Signed)
Virtual Visit via Video Note  I connected with Micheal Roth on 05/24/23 at  9:00 AM EDT by a video enabled telemedicine application and verified that I am speaking with the correct person using two identifiers.  Location: Patient: patient home Provider: clinical home office   I discussed the limitations of evaluation and management by telemedicine and the availability of in person appointments. The patient expressed understanding and agreed to proceed.  I discussed the assessment and treatment plan with the patient. The patient was provided an opportunity to ask questions and all were answered. The patient agreed with the plan and demonstrated an understanding of the instructions.   The patient was advised to call back or seek an in-person evaluation if the symptoms worsen or if the condition fails to improve as anticipated.  Pt was provided 240 minutes of non-face-to-face time during this encounter.   Donia Guiles, LCSW   [facility] PHP THERAPIST PROGRESS NOTE  Micheal Roth [id number]  Session Time: 9:00 - 10:00  Participation Level: Active  Behavioral Response: CasualAlertAnxious  Type of Therapy: Group Therapy  Treatment Goals addressed: Coping  Progress Towards Goals: Initial  Interventions: CBT, DBT, Supportive, and Reframing  Summary: Micheal Roth is a 47 y.o. male who presents with depression and anxiety symptoms.  Clinician led check-in regarding current stressors and situation, and review of patient completed daily inventory. Clinician utilized active listening and empathetic response and validated patient emotions. Clinician facilitated processing group on pertinent issues.?    Therapist Response: Patient arrived within time allowed. Patient rates his mood at a 5 on a scale of 1-10 with 10 being best. Pt states he feels "just kind of here." Pt states he slept 5 hours and ate 2x yesterday. Pt reports he spent the day alone yesterday and watched tv.  Pt reports struggling with racing thoughts and restlessness. Pt reports difficulty with concentration and motivation. Pt identifies hopeless and worthless thoughts.  Patient able to process. Patient engaged in discussion.      Session Time: 10:00 am - 11:00 am   Participation Level: Active   Behavioral Response: CasualAlertAnxious and Depressed   Type of Therapy: Group Therapy   Treatment Goals addressed: Coping   Progress Towards Goals: Progressing   Interventions: CBT, DBT, Solution Focused, Strength-based, Supportive, and Reframing   Therapist Response: Cln led discussion on impulsivity. Group discussed struggles with impulsivity. Cln highlighted theme of immediacy. Group built insight around the way immediacy interacts with impulsivity. Cln encouraged pt's to consider mantras and grounding statements to remind themselves there is time to think/feel/act.    Therapist Response:   Pt engaged in discussion and is able to make connections and gain insight.          Session Time: 11:00 -12:00   Participation Level: Active   Behavioral Response: CasualAlertDepressed   Type of Therapy: Group Therapy   Treatment Goals addressed: Coping   Progress Towards Goals: Progressing   Interventions: CBT, DBT, Solution Focused, Strength-based, Supportive, and Reframing   Summary: Cln led discussion on unknowns and the way they impact our anxiety. Cln discussed cognitive restructuring, DBT distraction skills, and positive mantras/prayer as ways to address the anxiety. Cln discussed idea "I can do hard things" and ways to bolster confidence in our ability to handle difficult situations.    Therapist Response:  Pt shared current unknowns they are dealing with and is able to identify ways to manage.          Session Time: 12:00 -  1:00   Participation Level: Active   Behavioral Response: CasualAlertDepressed   Type of Therapy: Group therapy   Treatment Goals addressed: Coping    Progress Towards Goals: Progressing   Interventions: OT group   Summary: 12:00 - 12:50: Occupational Therapy group with cln E. Hollan.  12:50 - 1:00 Clinician assessed for immediate needs, medication compliance and efficacy, and safety concerns.   Therapist Response: 12:00 - 12:50: See note 12:50 - 1:00 pm: At check-out, patient reports no immediate concerns. Patient demonstrates progress as evidenced by participation in first group session. Patient denies SI/HI/self-harm thoughts at the end of group.     Suicidal/Homicidal: Nowithout intent/plan  Plan: Pt will continue in PHP while working to decrease depression and anxiety symptoms, increase daily functioning, and in crease ability to manage symptoms in a healthy manner.   Collaboration of Care: Medication Management AEB T Lewis  Patient/Guardian was advised Release of Information must be obtained prior to any record release in order to collaborate their care with an outside provider. Patient/Guardian was advised if they have not already done so to contact the registration department to sign all necessary forms in order for [place] to release information regarding their care.   Consent: Patient/Guardian gives verbal consent for treatment and assignment of benefits for services provided during this visit. Patient/Guardian expressed understanding and agreed to proceed.   Diagnosis: Severe episode of recurrent major depressive disorder, without psychotic features (HCC) [F33.2]    1. Severe episode of recurrent major depressive disorder, without psychotic features (HCC)   2. GAD (generalized anxiety disorder)       Donia Guiles, LCSW

## 2023-06-03 NOTE — Psych (Signed)
Virtual Visit via Video Note  I connected with Micheal Roth on 05/25/23 at  9:00 AM EDT by a video enabled telemedicine application and verified that I am speaking with the correct person using two identifiers.  Location: Patient: patient home Provider: clinical home office   I discussed the limitations of evaluation and management by telemedicine and the availability of in person appointments. The patient expressed understanding and agreed to proceed.  I discussed the assessment and treatment plan with the patient. The patient was provided an opportunity to ask questions and all were answered. The patient agreed with the plan and demonstrated an understanding of the instructions.   The patient was advised to call back or seek an in-person evaluation if the symptoms worsen or if the condition fails to improve as anticipated.  Pt was provided 240 minutes of non-face-to-face time during this encounter.   Donia Guiles, LCSW   [facility] BH PHP THERAPIST PROGRESS NOTE  Micheal Roth [id number]  Session Time: 9:00 - 10:00  Participation Level: Active  Behavioral Response: CasualAlertAnxious  Type of Therapy: Group Therapy  Treatment Goals addressed: Coping  Progress Towards Goals: Initial  Interventions: CBT, DBT, Supportive, and Reframing  Summary: Micheal Roth is a 47 y.o. male who presents with depression and anxiety symptoms.  Clinician led check-in regarding current stressors and situation, and review of patient completed daily inventory. Clinician utilized active listening and empathetic response and validated patient emotions. Clinician facilitated processing group on pertinent issues.?    Therapist Response: Patient arrived within time allowed. Patient rates his mood at a 3 on a scale of 1-10 with 10 being best. Pt states he feels "not great." Pt states he slept 2.5 hours and ate 2x yesterday. Pt reports he has been up since midnight due to stomach distress.  Pt reports this has not been uncommon in the past month and connects it to anxiety. Pt reports high anxiety yesterday and attempted to manage by watching tv, walking, and going outside with his dog. Pt denies SI. Patient able to process. Patient engaged in discussion.      Session Time: 10:00 am - 11:00 am   Participation Level: Active   Behavioral Response: CasualAlertAnxious and Depressed   Type of Therapy: Group Therapy   Treatment Goals addressed: Coping   Progress Towards Goals: Progressing   Interventions: CBT, DBT, Solution Focused, Strength-based, Supportive, and Reframing   Therapist Response: Cln led discussion on CBT thinking error: mind reading. Cln worked with group members to identify examples of mind reading and the consequences that can come. Group shared ways in which mind reading has been an issue for them and barriers to working on it.    Therapist Response:   Pt engaged in discussion and reports understanding of mind reading.           Session Time: 11:00 -12:00   Participation Level: Active   Behavioral Response: CasualAlertDepressed   Type of Therapy: Group Therapy   Treatment Goals addressed: Coping   Progress Towards Goals: Progressing   Interventions: CBT, DBT, Solution Focused, Strength-based, Supportive, and Reframing   Summary: Cln led discussion on CBT reframing and how to look for alternative possibilities to decrease distress from negative thinking. Cln utilized the CBT triangle to aid discussion. Group members shared difficult situations and worked with group to reframe the negative thinking present.    Therapist Response: Pt engaged in Engineer, manufacturing.          Session Time: 12:00 -1:00  Participation Level: Active   Behavioral Response: CasualAlertDepressed   Type of Therapy: Group therapy   Treatment Goals addressed: Coping   Progress Towards Goals: Progressing   Interventions: OT group   Summary: 12:00 - 12:50:  Occupational Therapy group with cln E. Hollan.  12:50 - 1:00 Clinician assessed for immediate needs, medication compliance and efficacy, and safety concerns.   Therapist Response: 12:00 - 12:50: See note 12:50 - 1:00 pm: At check-out, patient reports no immediate concerns. Patient demonstrates progress as evidenced by continued engagement and responsiveness to treatment. Patient denies SI/HI/self-harm thoughts at the end of group.     Suicidal/Homicidal: Nowithout intent/plan  Plan: Pt will continue in PHP while working to decrease depression and anxiety symptoms, increase daily functioning, and in crease ability to manage symptoms in a healthy manner.   Collaboration of Care: Medication Management AEB T Lewis  Patient/Guardian was advised Release of Information must be obtained prior to any record release in order to collaborate their care with an outside provider. Patient/Guardian was advised if they have not already done so to contact the registration department to sign all necessary forms in order for [place] to release information regarding their care.   Consent: Patient/Guardian gives verbal consent for treatment and assignment of benefits for services provided during this visit. Patient/Guardian expressed understanding and agreed to proceed.   Diagnosis: Severe episode of recurrent major depressive disorder, without psychotic features (HCC) [F33.2]    1. Severe episode of recurrent major depressive disorder, without psychotic features (HCC)   2. GAD (generalized anxiety disorder)       Donia Guiles, LCSW

## 2023-06-04 ENCOUNTER — Encounter (HOSPITAL_COMMUNITY): Payer: Self-pay

## 2023-06-04 ENCOUNTER — Other Ambulatory Visit (HOSPITAL_COMMUNITY): Payer: 59

## 2023-06-04 ENCOUNTER — Other Ambulatory Visit (HOSPITAL_COMMUNITY): Payer: 59 | Admitting: Licensed Clinical Social Worker

## 2023-06-04 DIAGNOSIS — R4589 Other symptoms and signs involving emotional state: Secondary | ICD-10-CM

## 2023-06-04 DIAGNOSIS — F411 Generalized anxiety disorder: Secondary | ICD-10-CM

## 2023-06-04 DIAGNOSIS — F332 Major depressive disorder, recurrent severe without psychotic features: Secondary | ICD-10-CM

## 2023-06-04 NOTE — Psych (Signed)
Virtual Visit via Video Note  I connected with Micheal Roth on 05/31/23 at  9:00 AM EDT by a video enabled telemedicine application and verified that I am speaking with the correct person using two identifiers.  Location: Patient: patient home Provider: clinical home office   I discussed the limitations of evaluation and management by telemedicine and the availability of in person appointments. The patient expressed understanding and agreed to proceed.  I discussed the assessment and treatment plan with the patient. The patient was provided an opportunity to ask questions and all were answered. The patient agreed with the plan and demonstrated an understanding of the instructions.   The patient was advised to call back or seek an in-person evaluation if the symptoms worsen or if the condition fails to improve as anticipated.  Pt was provided 240 minutes of non-face-to-face time during this encounter.   Donia Guiles, LCSW   [facility] PHP THERAPIST PROGRESS NOTE  ZAYDYN HAVEY [id number]  Session Time: 9:00 - 10:00  Participation Level: Active  Behavioral Response: CasualAlertAnxious  Type of Therapy: Group Therapy  Treatment Goals addressed: Coping  Progress Towards Goals: Initial  Interventions: CBT, DBT, Supportive, and Reframing  Summary: Micheal Roth is a 47 y.o. male who presents with depression and anxiety symptoms.  Clinician led check-in regarding current stressors and situation, and review of patient completed daily inventory. Clinician utilized active listening and empathetic response and validated patient emotions. Clinician facilitated processing group on pertinent issues.?    Therapist Response: Patient arrived within time allowed. Patient rates his mood at a 4 on a scale of 1-10 with 10 being best. Pt states he feels "anxious." Pt states he slept 6 hours and ate 2x yesterday. Pt states he spoke to his girlfriend off/on yesterday and attempted  to work on paperwork. Pt reports he is struggling with focus, restlessness, and is feeling scattered. Patient able to process. Patient engaged in discussion.      Session Time: 10:00 am - 11:00 am   Participation Level: Active   Behavioral Response: CasualAlertAnxious and Depressed   Type of Therapy: Group Therapy   Treatment Goals addressed: Coping   Progress Towards Goals: Progressing   Interventions: CBT, DBT, Solution Focused, Strength-based, Supportive, and Reframing   Therapist Response: Cln facilitated processing group around difficult relationships. Group members shared struggles they face in relationships. Cln brought in topics of self-esteem, CBT thought challenging, boundaries, and communication to aid growth.   Therapist Response: Pt engaged in discussion and is able to process.           Session Time: 11:00 -12:00   Participation Level: Active   Behavioral Response: CasualAlertDepressed   Type of Therapy: Group Therapy   Treatment Goals addressed: Coping   Progress Towards Goals: Progressing   Interventions: CBT, DBT, Solution Focused, Strength-based, Supportive, and Reframing   Summary: Cln continued topic of DBT distress tolerance skills and the ACCEPTS distraction skill. Group reviewed C-C skills and discussed how they can practice them in their every day life.    Therapist Response: Pt engaged in discussion and is able to state ways to apply these skills.          Session Time: 12:00 -1:00   Participation Level: Active   Behavioral Response: CasualAlertDepressed   Type of Therapy: Group therapy   Treatment Goals addressed: Coping   Progress Towards Goals: Progressing   Interventions: OT group   Summary: 12:00 - 12:50: Occupational Therapy group with cln E. Hollan.  12:50 - 1:00 Clinician assessed for immediate needs, medication compliance and efficacy, and safety concerns.   Therapist Response: 12:00 - 12:50: See note 12:50 - 1:00 pm:  At check-out, patient reports no immediate concerns. Patient demonstrates progress as evidenced by continued engagement and responsiveness to treatment. Patient denies SI/HI/self-harm thoughts at the end of group.     Suicidal/Homicidal: Nowithout intent/plan  Plan: Pt will continue in PHP while working to decrease depression and anxiety symptoms, increase daily functioning, and in crease ability to manage symptoms in a healthy manner.   Collaboration of Care: Medication Management AEB T Lewis  Patient/Guardian was advised Release of Information must be obtained prior to any record release in order to collaborate their care with an outside provider. Patient/Guardian was advised if they have not already done so to contact the registration department to sign all necessary forms in order for [place] to release information regarding their care.   Consent: Patient/Guardian gives verbal consent for treatment and assignment of benefits for services provided during this visit. Patient/Guardian expressed understanding and agreed to proceed.   Diagnosis: Severe episode of recurrent major depressive disorder, without psychotic features (HCC) [F33.2]    1. Severe episode of recurrent major depressive disorder, without psychotic features (HCC)   2. GAD (generalized anxiety disorder)       Donia Guiles, LCSW

## 2023-06-04 NOTE — Psych (Signed)
Virtual Visit via Video Note  I connected with Rolland A Laprade on 06/04/23 at  9:00 AM EDT by a video enabled telemedicine application and verified that I am speaking with the correct person using two identifiers.  Location: Patient: patient home Provider: clinical home office   I discussed the limitations of evaluation and management by telemedicine and the availability of in person appointments. The patient expressed understanding and agreed to proceed.  I discussed the assessment and treatment plan with the patient. The patient was provided an opportunity to ask questions and all were answered. The patient agreed with the plan and demonstrated an understanding of the instructions.   The patient was advised to call back or seek an in-person evaluation if the symptoms worsen or if the condition fails to improve as anticipated.  Pt was provided 240 minutes of non-face-to-face time during this encounter.   Donia Guiles, LCSW   [facility] BH PHP THERAPIST PROGRESS NOTE  MARIUS BETTS [id number]  Session Time: 9:00 - 10:00  Participation Level: Active  Behavioral Response: CasualAlertAnxious  Type of Therapy: Group Therapy  Treatment Goals addressed: Coping  Progress Towards Goals: Progressing  Interventions: CBT, DBT, Supportive, and Reframing  Summary: ARMANDO BUKHARI is a 47 y.o. male who presents with depression and anxiety symptoms.  Clinician led check-in regarding current stressors and situation, and review of patient completed daily inventory. Clinician utilized active listening and empathetic response and validated patient emotions. Clinician facilitated processing group on pertinent issues.?    Therapist Response: Patient arrived within time allowed. Patient rates his mood at a 3 on a scale of 1-10 with 10 being best. Pt states he feels "real depressed." Pt states he slept 4 hours and ate 1x yesterday. Pt reports weekend was difficult in terms of pain, low  energy, fatigue, and mood. Pt states he did not leave the house or talk to anyone and watched tv or slept. Pt reports frustration in not being able to perform at the level he is used to and in struggling with daily tasks. Pt identifies passive SI and denies plan/intent. Patient able to process. Patient engaged in discussion.      Session Time: 10:00 am - 11:00 am   Participation Level: Active   Behavioral Response: CasualAlertAnxious and Depressed   Type of Therapy: Group Therapy   Treatment Goals addressed: Coping   Progress Towards Goals: Progressing   Interventions: CBT, DBT, Solution Focused, Strength-based, Supportive, and Reframing   Therapist Response: Cln led processing group for pt's current struggles. Group members shared stressors and provided support and feedback. Cln brought in topics of boundaries, healthy relationships, and unhealthy thought processes to inform discussion.    Therapist Response:  Pt able to process and provide support to group.          Session Time: 11:00 -12:00   Participation Level: Active   Behavioral Response: CasualAlertDepressed   Type of Therapy: Group Therapy   Treatment Goals addressed: Coping   Progress Towards Goals: Progressing   Interventions: CBT, DBT, Solution Focused, Strength-based, Supportive, and Reframing   Summary: Cln led discussion on healthy relationships. Group members shared issues they have experienced in past relationships and in identifying what "healthy" looks like. Cln discussed respect, trust, and honesty as non-negotiable traits in a healthy dynamic. Group shared and problem solved barriers to recognizing and prioritizing these traits.    Therapist Response: Pt engaged in discussion and is able to process.         Session Time:  12:00 -1:00   Participation Level: Active   Behavioral Response: CasualAlertDepressed   Type of Therapy: Group therapy   Treatment Goals addressed: Coping   Progress  Towards Goals: Progressing   Interventions: OT group   Summary: 12:00 - 12:50: Occupational Therapy group with cln E. Hollan.  12:50 - 1:00 Clinician assessed for immediate needs, medication compliance and efficacy, and safety concerns.   Therapist Response: 12:00 - 12:50: See note 12:50 - 1:00 pm: At check-out, patient reports no immediate concerns. Patient demonstrates progress as evidenced by continued engagement and responsiveness to treatment. Patient denies SI/HI/self-harm thoughts at the end of group.     Suicidal/Homicidal: Nowithout intent/plan  Plan: Pt will continue in PHP while working to decrease depression and anxiety symptoms, increase daily functioning, and in crease ability to manage symptoms in a healthy manner.   Collaboration of Care: Medication Management AEB T Lewis  Patient/Guardian was advised Release of Information must be obtained prior to any record release in order to collaborate their care with an outside provider. Patient/Guardian was advised if they have not already done so to contact the registration department to sign all necessary forms in order for [place] to release information regarding their care.   Consent: Patient/Guardian gives verbal consent for treatment and assignment of benefits for services provided during this visit. Patient/Guardian expressed understanding and agreed to proceed.   Diagnosis: Severe episode of recurrent major depressive disorder, without psychotic features (HCC) [F33.2]    1. Severe episode of recurrent major depressive disorder, without psychotic features (HCC)   2. GAD (generalized anxiety disorder)       Donia Guiles, LCSW
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
## 2023-06-04 NOTE — Therapy (Signed)
[facility] PARTIAL HOSPITALIZATION PROGRAM [address] [place], [place], [address] Phone: [phone]   Fax:  [phone]  Occupational Therapy Treatment Virtual Visit via Video Note  I connected with Micheal Roth on 06/04/23 at  8:00 AM EDT by a video enabled telemedicine application and verified that I am speaking with the correct person using two identifiers.  Location: Patient: home Provider: office   I discussed the limitations of evaluation and management by telemedicine and the availability of in person appointments. The patient expressed understanding and agreed to proceed.    The patient was advised to call back or seek an in-person evaluation if the symptoms worsen or if the condition fails to improve as anticipated.  I provided 55 minutes of non-face-to-face time during this encounter.   Patient Details  Name: Micheal Roth MRN: [id number] Date of Birth: [birth year] No data recorded  Encounter Date: 05/31/2023   OT End of Session - 06/04/23 0805     Visit Number 5    Number of Visits 20    Date for OT Re-Evaluation 06/24/23    OT Start Time 1200    OT Stop Time 1255    OT Time Calculation (min) 55 min             Past Medical History:  Diagnosis Date   Arthritis    Chronic back pain    Chronic left shoulder pain    Chronic neck pain    Depression    GERD (gastroesophageal reflux disease)    History of shingles    Migraines    Nausea and vomiting 04/09/2017   Paresthesia of left arm and leg    and weakness of left arm and leg   Seizures (HCC)    not in years    Past Surgical History:  Procedure Laterality Date   BACK SURGERY     KNEE ARTHROSCOPY WITH LATERAL MENISECTOMY Right 05/18/2022   Procedure: KNEE ARTHROSCOPY WITH CHONDROPLASTY  AND DEBRIDEMENT;  Surgeon: Yolonda Kida, MD;  Location: [facility] ORS;  Service: Orthopedics;  Laterality: Right;  62   KNEE SURGERY     TUMOR REMOVAL Bilateral 1980    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   Subjective Assessment - 06/04/23 0805     Currently in Pain? No/denies    Pain Score 0-No pain                Group Session:  S: Doing better today.   O: The objective of the telehealth group therapy session was to discuss the significance of routines in promoting mental health and wellbeing. The OT aimed to explore the power of routines in providing structure, stability, and predictability in individuals' lives, as well as their role in establishing healthy habits, reducing stress and anxiety, managing time effectively, achieving goals, and fostering a sense of community and social connectedness.   Participants were guided to identify areas where routines could improve their mental health and were provided with tips for establishing and maintaining healthy routines. The session concluded by emphasizing the potential of routines to enhance overall quality of life.    A: The patient's active participation, understanding of the concepts discussed, and their motivation to implement positive changes in their life, it can be assessed that they have a good potential to benefit from incorporating healthy routines into their daily life. The patient's commitment to reflecting on their  current routines and selecting an area for improvement indicates a proactive approach to their mental health and wellbeing. Continued support and guidance in implementing and maintaining the identified routine will be beneficial for the patient's overall progress.   P: Continue to attend PHP OT group sessions 5x week for 4 weeks to promote daily structure, social engagement, and opportunities to develop and utilize adaptive strategies to maximize functional performance in preparation for safe transition and integration back into school, work, and the community. Plan to address topic of tbd in next OT group  session.                   OT Education - 06/04/23 0805     Education Details Routines              OT Short Term Goals - 05/25/23 1127       OT SHORT TERM GOAL #1   Title Patient will be educated on strategies to improve psychosocial skills needed to participate fully in all daily, work, and leisure activities.    Time 4    Period Weeks    Status On-going    Target Date 06/24/23      OT SHORT TERM GOAL #2   Title Pt will apply psychosocial skills and coping mechanisms to daily activities in order to function independently and reintegrate into community dwelling    Status On-going      OT SHORT TERM GOAL #3   Title Pt will choose and/or engage in 1-3 socially engaging leisure activities to improve social participation skills upon reintegrating into community    Status On-going                      Plan - 06/04/23 0805     Occupational performance deficits (Please refer to evaluation for details): Work;Social Participation;Rest and Sleep;Leisure    Psychosocial Skills Coping Strategies;Habits;Routines and Behaviors;Interpersonal Interaction             Patient will benefit from skilled therapeutic intervention in order to improve the following deficits and impairments:       Psychosocial Skills: Coping Strategies, Habits, Routines and Behaviors, Interpersonal Interaction   Visit Diagnosis: Difficulty coping    Problem List Patient Active Problem List   Diagnosis Date Noted   MDD (major depressive disorder), recurrent episode, severe (HCC) 05/21/2023   GAD (generalized anxiety disorder) 05/21/2023   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   Ocular pain, right eye-  actually periorbital 01/23/2018   Exertional epigastric/chest pain 05/03/2017   H/O: substance abuse (HCC) 05/03/2017   Nausea and vomiting 04/09/2017   Elevated triglycerides with high cholesterol 03/22/2017   Low serum HDL 03/22/2017   Myalgia 03/22/2017   Obesity, Class I,  BMI 30-34.9 02/22/2017   Drug abuse, smokes marijuana qd 02/22/2017   Chronic heartburn 02/22/2017   Epigastric abdominal pain 02/22/2017   h/o Seizures (HCC) 04/26/2016   History of many Head concussions 04/26/2016   h/o Chronic neck pain 04/26/2016   History of shingles 04/26/2016   Neuralgia, postherpetic- R L Ext 04/26/2016   Edema of right lower extremity 04/26/2016   h/o Weakness of left arm 07/14/2013   h/o Paresthesia of left arm 07/14/2013   h/o Neck pain on left side 03/17/2013   h/o Pain in joint, R shoulder region 03/17/2013   Acute confusional state 12/18/2012   h/o TOBACCO USER 05/10/2009   h/o Migraine 02/15/2009   h/o chronic LOW BACK PAIN 07/10/2008   Adjustment disorder  with mixed anxiety and depressed mood 07/04/2007    Ted Mcalpine, OT 06/04/2023, 8:05 AM  Kerrin Champagne, OT   [facility] PROGRAM [address] [place], [place], [address] Phone: [phone]   Fax:  [phone]  Name: Micheal Roth MRN: [id number] Date of Birth: 17-May-1976

## 2023-06-04 NOTE — Psych (Signed)
Virtual Visit via Video Note  I connected with Micheal Roth on 05/30/23 at  9:00 AM EDT by a video enabled telemedicine application and verified that I am speaking with the correct person using two identifiers.  Location: Patient: patient home Provider: clinical home office   I discussed the limitations of evaluation and management by telemedicine and the availability of in person appointments. The patient expressed understanding and agreed to proceed.  I discussed the assessment and treatment plan with the patient. The patient was provided an opportunity to ask questions and all were answered. The patient agreed with the plan and demonstrated an understanding of the instructions.   The patient was advised to call back or seek an in-person evaluation if the symptoms worsen or if the condition fails to improve as anticipated.  Pt was provided 240 minutes of non-face-to-face time during this encounter.   Donia Guiles, LCSW   [facility] BH PHP THERAPIST PROGRESS NOTE  Micheal Roth [id number]  Session Time: 9:00 - 10:00  Participation Level: Active  Behavioral Response: CasualAlertAnxious  Type of Therapy: Group Therapy  Treatment Goals addressed: Coping  Progress Towards Goals: Initial  Interventions: CBT, DBT, Supportive, and Reframing  Summary: Micheal Roth is a 47 y.o. male who presents with depression and anxiety symptoms.  Clinician led check-in regarding current stressors and situation, and review of patient completed daily inventory. Clinician utilized active listening and empathetic response and validated patient emotions. Clinician facilitated processing group on pertinent issues.?    Therapist Response: Patient arrived within time allowed. Patient rates his mood at a 6 on a scale of 1-10 with 10 being best. Pt states he feels "tired." Pt states he slept 5 broken hours and ate 2x yesterday. Pt reports he lost power for multiple hours in the middle of the  night which interrupted his sleep. Pt shares he went to the store with his mom and it was "bearable." Pt reports struggle with motivation and energy.  Patient able to process. Patient engaged in discussion.      Session Time: 10:00 am - 11:00 am   Participation Level: Active   Behavioral Response: CasualAlertAnxious and Depressed   Type of Therapy: Group Therapy   Treatment Goals addressed: Coping   Progress Towards Goals: Progressing   Interventions: CBT, DBT, Solution Focused, Strength-based, Supportive, and Reframing   Therapist Response: Cln led processing group for pt's current struggles. Group members shared stressors and provided support and feedback. Cln brought in topics of boundaries, healthy relationships, and unhealthy thought processes to inform discussion.    Therapist Response: Pt able to process and provide support to group.          Session Time: 11:00 -12:00   Participation Level: Active   Behavioral Response: CasualAlertDepressed   Type of Therapy: Group Therapy   Treatment Goals addressed: Coping   Progress Towards Goals: Progressing   Interventions: Strength-based, Supportive, and Reframing   Summary: Chaplaincy group with Micheal Roth   Therapist Response: Pt participated and engaged in discussion.          Session Time: 12:00 -1:00   Participation Level: Active   Behavioral Response: CasualAlertDepressed   Type of Therapy: Group therapy   Treatment Goals addressed: Coping   Progress Towards Goals: Progressing   Interventions: OT group   Summary: 12:00 - 12:50: Occupational Therapy group with cln Micheal Roth.  12:50 - 1:00 Clinician assessed for immediate needs, medication compliance and efficacy, and safety concerns.   Therapist Response: 12:00 -  12:50: See note 12:50 - 1:00 pm: At check-out, patient reports no immediate concerns. Patient demonstrates progress as evidenced by continued engagement and responsiveness to treatment.  Patient denies SI/HI/self-harm thoughts at the end of group.     Suicidal/Homicidal: Nowithout intent/plan  Plan: Pt will continue in PHP while working to decrease depression and anxiety symptoms, increase daily functioning, and in crease ability to manage symptoms in a healthy manner.   Collaboration of Care: Medication Management AEB Micheal Roth  Patient/Guardian was advised Release of Information must be obtained prior to any record release in order to collaborate their care with an outside provider. Patient/Guardian was advised if they have not already done so to contact the registration department to sign all necessary forms in order for [place] to release information regarding their care.   Consent: Patient/Guardian gives verbal consent for treatment and assignment of benefits for services provided during this visit. Patient/Guardian expressed understanding and agreed to proceed.   Diagnosis: Severe episode of recurrent major depressive disorder, without psychotic features (HCC) [F33.2]    1. Severe episode of recurrent major depressive disorder, without psychotic features (HCC)   2. GAD (generalized anxiety disorder)       Donia Guiles, LCSW

## 2023-06-05 ENCOUNTER — Other Ambulatory Visit (HOSPITAL_COMMUNITY): Payer: 59 | Attending: Psychiatry | Admitting: Licensed Clinical Social Worker

## 2023-06-05 ENCOUNTER — Other Ambulatory Visit (HOSPITAL_COMMUNITY): Payer: 59 | Attending: Psychiatry

## 2023-06-05 DIAGNOSIS — F411 Generalized anxiety disorder: Secondary | ICD-10-CM | POA: Insufficient documentation

## 2023-06-05 DIAGNOSIS — F332 Major depressive disorder, recurrent severe without psychotic features: Secondary | ICD-10-CM | POA: Insufficient documentation

## 2023-06-05 DIAGNOSIS — F4323 Adjustment disorder with mixed anxiety and depressed mood: Secondary | ICD-10-CM | POA: Insufficient documentation

## 2023-06-05 DIAGNOSIS — Z79899 Other long term (current) drug therapy: Secondary | ICD-10-CM | POA: Insufficient documentation

## 2023-06-05 DIAGNOSIS — R4589 Other symptoms and signs involving emotional state: Secondary | ICD-10-CM

## 2023-06-05 DIAGNOSIS — F17210 Nicotine dependence, cigarettes, uncomplicated: Secondary | ICD-10-CM | POA: Insufficient documentation

## 2023-06-05 DIAGNOSIS — Z565 Uncongenial work environment: Secondary | ICD-10-CM | POA: Insufficient documentation

## 2023-06-06 ENCOUNTER — Encounter (HOSPITAL_COMMUNITY): Payer: Self-pay

## 2023-06-06 ENCOUNTER — Other Ambulatory Visit (HOSPITAL_COMMUNITY): Payer: 59

## 2023-06-06 ENCOUNTER — Other Ambulatory Visit (HOSPITAL_COMMUNITY): Payer: 59 | Admitting: Licensed Clinical Social Worker

## 2023-06-06 DIAGNOSIS — R4589 Other symptoms and signs involving emotional state: Secondary | ICD-10-CM

## 2023-06-06 DIAGNOSIS — F411 Generalized anxiety disorder: Secondary | ICD-10-CM | POA: Diagnosis not present

## 2023-06-06 DIAGNOSIS — F332 Major depressive disorder, recurrent severe without psychotic features: Secondary | ICD-10-CM

## 2023-06-06 MED ORDER — TRAZODONE HCL 50 MG PO TABS: 50.0000 mg | ORAL_TABLET | Freq: Every evening | ORAL | 1 refills | Status: DC | PRN

## 2023-06-06 MED ORDER — ESCITALOPRAM OXALATE 10 MG PO TABS: 20.0000 mg | ORAL_TABLET | Freq: Every day | ORAL | 1 refills | Status: DC
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
## 2023-06-06 NOTE — Therapy (Signed)
[facility] PARTIAL HOSPITALIZATION PROGRAM [address] [place], [place], [address] Phone: [phone]   Fax:  [phone]  Occupational Therapy Treatment Virtual Visit via Video Note  I connected with Si Raider Schlotzhauer on 06/06/23 at  8:00 AM EDT by a video enabled telemedicine application and verified that I am speaking with the correct person using two identifiers.  Location: Patient: home Provider: office   I discussed the limitations of evaluation and management by telemedicine and the availability of in person appointments. The patient expressed understanding and agreed to proceed.    The patient was advised to call back or seek an in-person evaluation if the symptoms worsen or if the condition fails to improve as anticipated.  I provided 55 minutes of non-face-to-face time during this encounter.   Patient Details  Name: KAZI REPPOND MRN: [id number] Date of Birth: 03/15/1976 No data recorded  Encounter Date: 06/04/2023   OT End of Session - 06/06/23 0907     Visit Number 6    Number of Visits 20    Date for OT Re-Evaluation 06/24/23    OT Start Time 1200    OT Stop Time 1255    OT Time Calculation (min) 55 min             Past Medical History:  Diagnosis Date   Arthritis    Chronic back pain    Chronic left shoulder pain    Chronic neck pain    Depression    GERD (gastroesophageal reflux disease)    History of shingles    Migraines    Nausea and vomiting 04/09/2017   Paresthesia of left arm and leg    and weakness of left arm and leg   Seizures (HCC)    not in years    Past Surgical History:  Procedure Laterality Date   BACK SURGERY     KNEE ARTHROSCOPY WITH LATERAL MENISECTOMY Right 05/18/2022   Procedure: KNEE ARTHROSCOPY WITH CHONDROPLASTY  AND DEBRIDEMENT;  Surgeon: Yolonda Kida, MD;  Location: [facility];  Service: Orthopedics;  Laterality: Right;  62   KNEE SURGERY     TUMOR REMOVAL Bilateral 1980    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   Subjective Assessment - 06/06/23 0906     Currently in Pain? No/denies    Pain Score 0-No pain                 Group Session:  S: Feeling better today.   O: The objective of this presentation is to provide a comprehensive understanding of the concept of "motivation" and its role in human behavior and well-being. The content covers various theories of motivation, including intrinsic and extrinsic motivators, and explores the psychological mechanisms that drive individuals to achieve goals, overcome obstacles, and make decisions. By diving into real-world applications, the presentation aims to offer actionable strategies for enhancing motivation in different life domains, such as work, relationships, and personal growth. Utilizing a multi-disciplinary approach, this presentation integrates insights from psychology, neuroscience, and behavioral economics to present a holistic view of motivation. The objective is not only to educate the audience about the complexities and driving forces behind motivation but also to equip them with practical tools and techniques to improve their own motivation levels. By the end of the presentation, attendees should have a well-rounded understanding of what motivates human actions and how to harness this knowledge for  personal and professional betterment.   A:The patient demonstrates a high level of engagement during the session, actively participating in discussions about motivation theories and their applicability to their own life. They show keen interest in learning new strategies to improve their motivation and even offer examples from their own experiences that align with the theories presented. Their level of self-awareness and willingness to invest in self-improvement suggest that they are well-positioned to benefit from the practical tools and techniques  discussed. The patient's ability to articulate their goals and challenges further supports the likelihood of successfully implementing the strategies presented.    P: Continue to attend PHP OT group sessions 5x week for 4 weeks to promote daily structure, social engagement, and opportunities to develop and utilize adaptive strategies to maximize functional performance in preparation for safe transition and integration back into school, work, and the community. Plan to address topic of pt 2 in next OT group session.                  OT Education - 06/06/23 0906     Education Details Motivation              OT Short Term Goals - 05/25/23 1127       OT SHORT TERM GOAL #1   Title Patient will be educated on strategies to improve psychosocial skills needed to participate fully in all daily, work, and leisure activities.    Time 4    Period Weeks    Status On-going    Target Date 06/24/23      OT SHORT TERM GOAL #2   Title Pt will apply psychosocial skills and coping mechanisms to daily activities in order to function independently and reintegrate into community dwelling    Status On-going      OT SHORT TERM GOAL #3   Title Pt will choose and/or engage in 1-3 socially engaging leisure activities to improve social participation skills upon reintegrating into community    Status On-going                      Plan - 06/06/23 0907     Psychosocial Skills Coping Strategies;Habits;Routines and Behaviors;Interpersonal Interaction             Patient will benefit from skilled therapeutic intervention in order to improve the following deficits and impairments:       Psychosocial Skills: Coping Strategies, Habits, Routines and Behaviors, Interpersonal Interaction   Visit Diagnosis: Difficulty coping    Problem List Patient Active Problem List   Diagnosis Date Noted   MDD (major depressive disorder), recurrent episode, severe (HCC) 05/21/2023    GAD (generalized anxiety disorder) 05/21/2023   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   Ocular pain, right eye-  actually periorbital 01/23/2018   Exertional epigastric/chest pain 05/03/2017   H/O: substance abuse (HCC) 05/03/2017   Nausea and vomiting 04/09/2017   Elevated triglycerides with high cholesterol 03/22/2017   Low serum HDL 03/22/2017   Myalgia 03/22/2017   Obesity, Class I, BMI 30-34.9 02/22/2017   Drug abuse, smokes marijuana qd 02/22/2017   Chronic heartburn 02/22/2017   Epigastric abdominal pain 02/22/2017   h/o Seizures (HCC) 04/26/2016   History of many Head concussions 04/26/2016   h/o Chronic neck pain 04/26/2016   History of shingles 04/26/2016   Neuralgia, postherpetic- R L Ext 04/26/2016   Edema of right lower extremity 04/26/2016   h/o Weakness of left arm 07/14/2013   h/o Paresthesia of left arm 07/14/2013  h/o Neck pain on left side 03/17/2013   h/o Pain in joint, R shoulder region 03/17/2013   Acute confusional state 12/18/2012   h/o TOBACCO USER 05/10/2009   h/o Migraine 02/15/2009   h/o chronic LOW BACK PAIN 07/10/2008   Adjustment disorder with mixed anxiety and depressed mood 07/04/2007    Ted Mcalpine, OT 06/06/2023, 9:08 AM  Kerrin Champagne, OT   [facility] PROGRAM [address] [place], [place], [address] Phone: [phone]   Fax:  [phone]  Name: KAYSTON JODOIN MRN: [id number] Date of Birth: [birth year]

## 2023-06-07 ENCOUNTER — Other Ambulatory Visit (HOSPITAL_COMMUNITY): Payer: 59

## 2023-06-07 ENCOUNTER — Encounter (HOSPITAL_COMMUNITY): Payer: Self-pay

## 2023-06-08 ENCOUNTER — Other Ambulatory Visit (HOSPITAL_COMMUNITY): Payer: 59

## 2023-06-08 ENCOUNTER — Other Ambulatory Visit (HOSPITAL_COMMUNITY): Payer: 59 | Admitting: Licensed Clinical Social Worker

## 2023-06-08 DIAGNOSIS — F332 Major depressive disorder, recurrent severe without psychotic features: Secondary | ICD-10-CM | POA: Diagnosis not present

## 2023-06-08 DIAGNOSIS — F411 Generalized anxiety disorder: Secondary | ICD-10-CM

## 2023-06-08 DIAGNOSIS — R4589 Other symptoms and signs involving emotional state: Secondary | ICD-10-CM

## 2023-06-08 NOTE — Progress Notes (Signed)
Spoke with patient via Teams video call, used 2 identifiers to correctly identify patient. States that groups are going well.. Had a MD visit yesterday that made him anxious and then had a disagreement with his girlfriend. He decided that she was not going to affect his mood today. On scale 1-10 as 10 being worst he rates depression at 2 and anxiety at 5. Denies SI/HI or AV hallucinations. Denies side effects from medications. No issues or complaints.

## 2023-06-10 ENCOUNTER — Encounter (HOSPITAL_COMMUNITY): Payer: Self-pay

## 2023-06-10 NOTE — Therapy (Signed)
[facility] PARTIAL HOSPITALIZATION PROGRAM [address] [place], [place], [address] Phone: [phone]   Fax:  [phone]  Occupational Therapy Treatment Virtual Visit via Video Note  I connected with Micheal Roth on 06/10/23 at  8:00 AM EDT by a video enabled telemedicine application and verified that I am speaking with the correct person using two identifiers.  Location: Patient: home Provider: office   I discussed the limitations of evaluation and management by telemedicine and the availability of in person appointments. The patient expressed understanding and agreed to proceed.    The patient was advised to call back or seek an in-person evaluation if the symptoms worsen or if the condition fails to improve as anticipated.  I provided 55 minutes of non-face-to-face time during this encounter.   Patient Details  Name: Micheal Roth MRN: [id number] Date of Birth: [birth year] No data recorded  Encounter Date: 06/05/2023   OT End of Session - 06/10/23 2113     Visit Number 7    Number of Visits 20    Date for OT Re-Evaluation 06/24/23    OT Start Time 1200    OT Stop Time 1255    OT Time Calculation (min) 55 min             Past Medical History:  Diagnosis Date   Arthritis    Chronic back pain    Chronic left shoulder pain    Chronic neck pain    Depression    GERD (gastroesophageal reflux disease)    History of shingles    Migraines    Nausea and vomiting 04/09/2017   Paresthesia of left arm and leg    and weakness of left arm and leg   Seizures (HCC)    not in years    Past Surgical History:  Procedure Laterality Date   BACK SURGERY     KNEE ARTHROSCOPY WITH LATERAL MENISECTOMY Right 05/18/2022   Procedure: KNEE ARTHROSCOPY WITH CHONDROPLASTY  AND DEBRIDEMENT;  Surgeon: Yolonda Kida, MD;  Location: [facility];  Service: Orthopedics;  Laterality: Right;  62   KNEE SURGERY     TUMOR REMOVAL Bilateral 1980    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   Subjective Assessment - 06/10/23 2113     Currently in Pain? No/denies    Pain Score 0-No pain                 Group Session:  S: Feeling better today.   O: The objective of the telehealth group therapy session was to discuss the significance of routines in promoting mental health and wellbeing. The OT aimed to explore the power of routines in providing structure, stability, and predictability in individuals' lives, as well as their role in establishing healthy habits, reducing stress and anxiety, managing time effectively, achieving goals, and fostering a sense of community and social connectedness.   Participants were guided to identify areas where routines could improve their mental health and were provided with tips for establishing and maintaining healthy routines. The session concluded by emphasizing the potential of routines to enhance overall quality of life.    A: During the telehealth group therapy session, the patient actively participated in the discussion on the importance of routines in promoting mental health and wellbeing. The patient demonstrated a good understanding of the power of routines in providing structure, stability, and predictability in their life. They  were able to identify areas in their life where routines could contribute to improving their overall mental health.  The patient showed motivation and willingness to reflect on their current habits and behaviors, recognizing the need for positive changes. They actively engaged in the session, sharing personal experiences and challenges related to establishing and maintaining healthy routines. The patient expressed a desire to reduce stress and anxiety, manage their time more effectively, and achieve their goals through the implementation of routines.    P: Continue to attend PHP OT group sessions 5x week for  4 weeks to promote daily structure, social engagement, and opportunities to develop and utilize adaptive strategies to maximize functional performance in preparation for safe transition and integration back into school, work, and the community. Plan to address topic of tbd in next OT group session.                  OT Education - 06/10/23 2113     Education Details Routines              OT Short Term Goals - 05/25/23 1127       OT SHORT TERM GOAL #1   Title Patient will be educated on strategies to improve psychosocial skills needed to participate fully in all daily, work, and leisure activities.    Time 4    Period Weeks    Status On-going    Target Date 06/24/23      OT SHORT TERM GOAL #2   Title Pt will apply psychosocial skills and coping mechanisms to daily activities in order to function independently and reintegrate into community dwelling    Status On-going      OT SHORT TERM GOAL #3   Title Pt will choose and/or engage in 1-3 socially engaging leisure activities to improve social participation skills upon reintegrating into community    Status On-going                      Plan - 06/10/23 2113     Psychosocial Skills Coping Strategies;Habits;Routines and Behaviors;Interpersonal Interaction             Patient will benefit from skilled therapeutic intervention in order to improve the following deficits and impairments:       Psychosocial Skills: Coping Strategies, Habits, Routines and Behaviors, Interpersonal Interaction   Visit Diagnosis: Difficulty coping    Problem List Patient Active Problem List   Diagnosis Date Noted   MDD (major depressive disorder), recurrent episode, severe (HCC) 05/21/2023   GAD (generalized anxiety disorder) 05/21/2023   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   Ocular pain, right eye-  actually periorbital 01/23/2018   Exertional epigastric/chest pain 05/03/2017   H/O: substance abuse (HCC) 05/03/2017    Nausea and vomiting 04/09/2017   Elevated triglycerides with high cholesterol 03/22/2017   Low serum HDL 03/22/2017   Myalgia 03/22/2017   Obesity, Class I, BMI 30-34.9 02/22/2017   Drug abuse, smokes marijuana qd 02/22/2017   Chronic heartburn 02/22/2017   Epigastric abdominal pain 02/22/2017   h/o Seizures (HCC) 04/26/2016   History of many Head concussions 04/26/2016   h/o Chronic neck pain 04/26/2016   History of shingles 04/26/2016   Neuralgia, postherpetic- R L Ext 04/26/2016   Edema of right lower extremity 04/26/2016   h/o Weakness of left arm 07/14/2013   h/o Paresthesia of left arm 07/14/2013   h/o Neck pain on left side 03/17/2013   h/o Pain in joint, R shoulder region 03/17/2013  Acute confusional state 12/18/2012   h/o TOBACCO USER 05/10/2009   h/o Migraine 02/15/2009   h/o chronic LOW BACK PAIN 07/10/2008   Adjustment disorder with mixed anxiety and depressed mood 07/04/2007    Micheal Roth, OT 06/10/2023, 9:14 PM Micheal Roth, OT   [facility] PROGRAM [address] [place], [place], [address] Phone: [phone]   Fax:  [phone]  Name: Micheal Roth MRN: [id number] Date of Birth: May 11, 1976

## 2023-06-10 NOTE — Therapy (Signed)
[facility] PARTIAL HOSPITALIZATION PROGRAM [address] [place], [place], [address] Phone: [phone]   Fax:  [phone]  Occupational Therapy Treatment Virtual Visit via Video Note  I connected with Si Raider Scarpelli on 06/10/23 at  8:00 AM EDT by a video enabled telemedicine application and verified that I am speaking with the correct person using two identifiers.  Location: Patient: home Provider: office   I discussed the limitations of evaluation and management by telemedicine and the availability of in person appointments. The patient expressed understanding and agreed to proceed.    The patient was advised to call back or seek an in-person evaluation if the symptoms worsen or if the condition fails to improve as anticipated.  I provided 55 minutes of non-face-to-face time during this encounter.   Patient Details  Name: Micheal Roth MRN: [id number] Date of Birth: [birth year] No data recorded  Encounter Date: 06/08/2023   OT End of Session - 06/10/23 2207     Visit Number 9    Number of Visits 20    Date for OT Re-Evaluation 06/24/23    OT Start Time 1200    OT Stop Time 1255    OT Time Calculation (min) 55 min             Past Medical History:  Diagnosis Date   Arthritis    Chronic back pain    Chronic left shoulder pain    Chronic neck pain    Depression    GERD (gastroesophageal reflux disease)    History of shingles    Migraines    Nausea and vomiting 04/09/2017   Paresthesia of left arm and leg    and weakness of left arm and leg   Seizures (HCC)    not in years    Past Surgical History:  Procedure Laterality Date   BACK SURGERY     KNEE ARTHROSCOPY WITH LATERAL MENISECTOMY Right 05/18/2022   Procedure: KNEE ARTHROSCOPY WITH CHONDROPLASTY  AND DEBRIDEMENT;  Surgeon: Yolonda Kida, MD;  Location: [facility];  Service: Orthopedics;  Laterality: Right;  62   KNEE SURGERY     TUMOR REMOVAL Bilateral 1980    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   Subjective Assessment - 06/10/23 2207     Currently in Pain? No/denies    Pain Score 0-No pain                Group Session:  S: Doing better today.  O: The primary objective of this topic is to explore and understand the concept of occupational balance in the context of daily living. The term "occupational balance" is defined broadly, encompassing all activities that occupy an individual's time and energy, including self-care, leisure, and work-related tasks. The goal is to guide participants towards achieving a harmonious blend of these activities, tailored to their personal values and life circumstances. This balance is aimed at enhancing overall well-being, not by equally distributing time across activities, but by ensuring that daily engagements are fulfilling and not draining. The content delves into identifying various barriers that individuals face in achieving occupational balance, such as overcommitment, misaligned priorities, external pressures, and lack of effective time management. The impact of these barriers on occupational performance, roles, and lifestyles is examined, highlighting issues like reduced efficiency, strained relationships, and potential health problems. Strategies for cultivating occupational balance are a key focus. These strategies include practical methods  like time blocking, prioritizing tasks, establishing self-care rituals, decluttering, connecting with nature, and engaging in reflective practices. These approaches are designed to be adaptable and applicable to a wide range of life scenarios, promoting a proactive and mindful approach to daily living. The overall aim is to equip participants with the knowledge and tools to create a balanced lifestyle that supports their mental, emotional, and physical health, thereby improving their functional performance in  daily life.   A:  The patient demonstrated a high level of engagement and active participation throughout the session on occupational balance. The patient frequently contributed to discussions, offering insightful reflections on personal experiences related to the barriers and strategies for achieving occupational balance. There was a clear understanding of the concept and an ability to relate it to their own life. The patient showed enthusiasm in learning and applying the strategies discussed, such as time blocking and self-care rituals, indicating a strong motivation to improve their occupational balance. The patient's proactive approach and responsiveness to the topic suggest a high potential for implementing these strategies effectively in their daily routine.   P: Continue to attend PHP OT group sessions 5x week for 4 weeks to promote daily structure, social engagement, and opportunities to develop and utilize adaptive strategies to maximize functional performance in preparation for safe transition and integration back into school, work, and the community. Plan to address topic of tbd in next OT group session.                   OT Education - 06/10/23 2207     Education Details Occupational Balance              OT Short Term Goals - 05/25/23 1127       OT SHORT TERM GOAL #1   Title Patient will be educated on strategies to improve psychosocial skills needed to participate fully in all daily, work, and leisure activities.    Time 4    Period Weeks    Status On-going    Target Date 06/24/23      OT SHORT TERM GOAL #2   Title Pt will apply psychosocial skills and coping mechanisms to daily activities in order to function independently and reintegrate into community dwelling    Status On-going      OT SHORT TERM GOAL #3   Title Pt will choose and/or engage in 1-3 socially engaging leisure activities to improve social participation skills upon reintegrating into  community    Status On-going                      Plan - 06/10/23 2208     Psychosocial Skills Coping Strategies;Habits;Routines and Behaviors;Interpersonal Interaction             Patient will benefit from skilled therapeutic intervention in order to improve the following deficits and impairments:       Psychosocial Skills: Coping Strategies, Habits, Routines and Behaviors, Interpersonal Interaction   Visit Diagnosis: Difficulty coping    Problem List Patient Active Problem List   Diagnosis Date Noted   MDD (major depressive disorder), recurrent episode, severe (HCC) 05/21/2023   GAD (generalized anxiety disorder) 05/21/2023   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   Ocular pain, right eye-  actually periorbital 01/23/2018   Exertional epigastric/chest pain 05/03/2017   H/O: substance abuse (HCC) 05/03/2017   Nausea and vomiting 04/09/2017   Elevated triglycerides with high cholesterol 03/22/2017   Low serum HDL 03/22/2017   Myalgia 03/22/2017  Obesity, Class I, BMI 30-34.9 02/22/2017   Drug abuse, smokes marijuana qd 02/22/2017   Chronic heartburn 02/22/2017   Epigastric abdominal pain 02/22/2017   h/o Seizures (HCC) 04/26/2016   History of many Head concussions 04/26/2016   h/o Chronic neck pain 04/26/2016   History of shingles 04/26/2016   Neuralgia, postherpetic- R L Ext 04/26/2016   Edema of right lower extremity 04/26/2016   h/o Weakness of left arm 07/14/2013   h/o Paresthesia of left arm 07/14/2013   h/o Neck pain on left side 03/17/2013   h/o Pain in joint, R shoulder region 03/17/2013   Acute confusional state 12/18/2012   h/o TOBACCO USER 05/10/2009   h/o Migraine 02/15/2009   h/o chronic LOW BACK PAIN 07/10/2008   Adjustment disorder with mixed anxiety and depressed mood 07/04/2007    Ted Mcalpine, OT 06/10/2023, 10:08 PM Kerrin Champagne, OT  [facility] HOSPITALIZATION PROGRAM [address] [place], [place], [address] Phone: [phone]   Fax:  [phone]  Name: AIZEN DUVAL MRN: [id number] Date of Birth: [birth year]

## 2023-06-10 NOTE — Therapy (Signed)
[facility] PARTIAL HOSPITALIZATION PROGRAM [address] [place], [place], [address] Phone: [phone]   Fax:  [phone]  Occupational Therapy Treatment Virtual Visit via Video Note  I connected with Micheal Roth on 06/10/23 at  8:00 AM EDT by a video enabled telemedicine application and verified that I am speaking with the correct person using two identifiers.  Location: Patient: home Provider: office   I discussed the limitations of evaluation and management by telemedicine and the availability of in person appointments. The patient expressed understanding and agreed to proceed.    The patient was advised to call back or seek an in-person evaluation if the symptoms worsen or if the condition fails to improve as anticipated.  I provided 55 minutes of non-face-to-face time during this encounter.   Patient Details  Name: Micheal Roth MRN: [id number] Date of Birth: [birth year] No data recorded  Encounter Date: 06/06/2023   OT End of Session - 06/10/23 2126     Visit Number 8    Number of Visits 20    Date for OT Re-Evaluation 06/24/23    OT Start Time 1200    OT Stop Time 1255    OT Time Calculation (min) 55 min             Past Medical History:  Diagnosis Date   Arthritis    Chronic back pain    Chronic left shoulder pain    Chronic neck pain    Depression    GERD (gastroesophageal reflux disease)    History of shingles    Migraines    Nausea and vomiting 04/09/2017   Paresthesia of left arm and leg    and weakness of left arm and leg   Seizures (HCC)    not in years    Past Surgical History:  Procedure Laterality Date   BACK SURGERY     KNEE ARTHROSCOPY WITH LATERAL MENISECTOMY Right 05/18/2022   Procedure: KNEE ARTHROSCOPY WITH CHONDROPLASTY  AND DEBRIDEMENT;  Surgeon: Yolonda Kida, MD;  Location: [facility];  Service: Orthopedics;  Laterality: Right;  62   KNEE SURGERY     TUMOR REMOVAL Bilateral 1980    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   Subjective Assessment - 06/10/23 2126     Currently in Pain? No/denies    Pain Score 0-No pain                  Group Session:  S: doing good today.   O: In this communication group therapy session, facilitated by an occupational therapist, participants explored several key subtopics aimed at enhancing their interpersonal skills. The session began with a discussion on the use of "I" and "AND" statements, emphasizing personal responsibility and constructive language in expressing feelings and needs. Participants then practiced active listening techniques to improve their ability to fully understand and respond to others. The group also delved into assertive communication, learning how to express themselves confidently and respectfully. Emotional regulation skills were addressed, providing strategies for managing emotions during interactions. Social skills training included role-playing scenarios to build confidence in various social contexts. Feedback and reflection were integral parts of the session, with participants offering and receiving constructive feedback to foster personal growth. The group identified common barriers to effective communication, such as anxiety, fear of judgment, and past negative experiences, and discussed strategies to overcome these challenges, including mindfulness practices and building a  supportive network.   A: The patient actively participated in the group therapy session, demonstrating a high level of engagement and enthusiasm. They contributed to discussions on "I" and "AND" statements by sharing personal examples and insights. During the active listening exercises, the patient was attentive and provided thoughtful feedback to peers. They effectively practiced assertive communication techniques and showed a clear understanding of emotional regulation  strategies. In social skills role-playing, the patient was confident and responsive, indicating a good grasp of the concepts. The patient also engaged in the feedback and reflection segment, offering constructive comments and showing receptivity to feedback from others. Their proactive approach and willingness to explore personal barriers to communication suggest significant progress and motivation to improve interpersonal skills.    P: Continue to attend PHP OT group sessions 5x week for 4 weeks to promote daily structure, social engagement, and opportunities to develop and utilize adaptive strategies to maximize functional performance in preparation for safe transition and integration back into school, work, and the community. Plan to address topic of tbd in next OT group session.                 OT Education - 06/10/23 2126     Education Details Communication              OT Short Term Goals - 05/25/23 1127       OT SHORT TERM GOAL #1   Title Patient will be educated on strategies to improve psychosocial skills needed to participate fully in all daily, work, and leisure activities.    Time 4    Period Weeks    Status On-going    Target Date 06/24/23      OT SHORT TERM GOAL #2   Title Pt will apply psychosocial skills and coping mechanisms to daily activities in order to function independently and reintegrate into community dwelling    Status On-going      OT SHORT TERM GOAL #3   Title Pt will choose and/or engage in 1-3 socially engaging leisure activities to improve social participation skills upon reintegrating into community    Status On-going                      Plan - 06/10/23 2126     Psychosocial Skills Coping Strategies;Habits;Routines and Behaviors;Interpersonal Interaction             Patient will benefit from skilled therapeutic intervention in order to improve the following deficits and impairments:       Psychosocial Skills:  Coping Strategies, Habits, Routines and Behaviors, Interpersonal Interaction   Visit Diagnosis: Difficulty coping    Problem List Patient Active Problem List   Diagnosis Date Noted   MDD (major depressive disorder), recurrent episode, severe (HCC) 05/21/2023   GAD (generalized anxiety disorder) 05/21/2023   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   Ocular pain, right eye-  actually periorbital 01/23/2018   Exertional epigastric/chest pain 05/03/2017   H/O: substance abuse (HCC) 05/03/2017   Nausea and vomiting 04/09/2017   Elevated triglycerides with high cholesterol 03/22/2017   Low serum HDL 03/22/2017   Myalgia 03/22/2017   Obesity, Class I, BMI 30-34.9 02/22/2017   Drug abuse, smokes marijuana qd 02/22/2017   Chronic heartburn 02/22/2017   Epigastric abdominal pain 02/22/2017   h/o Seizures (HCC) 04/26/2016   History of many Head concussions 04/26/2016   h/o Chronic neck pain 04/26/2016   History of shingles 04/26/2016   Neuralgia, postherpetic- R L Ext 04/26/2016  Edema of right lower extremity 04/26/2016   h/o Weakness of left arm 07/14/2013   h/o Paresthesia of left arm 07/14/2013   h/o Neck pain on left side 03/17/2013   h/o Pain in joint, R shoulder region 03/17/2013   Acute confusional state 12/18/2012   h/o TOBACCO USER 05/10/2009   h/o Migraine 02/15/2009   h/o chronic LOW BACK PAIN 07/10/2008   Adjustment disorder with mixed anxiety and depressed mood 07/04/2007    Ted Mcalpine, OT 06/10/2023, 9:26 PM  Kerrin Champagne, OT   [facility] HOSPITALIZATION PROGRAM [address] [place], [place], [address] Phone: [phone]   Fax:  [phone]  Name: Micheal Roth MRN: [id number] Date of Birth: 1976/05/24

## 2023-06-11 ENCOUNTER — Other Ambulatory Visit (HOSPITAL_COMMUNITY): Payer: 59

## 2023-06-11 ENCOUNTER — Other Ambulatory Visit (HOSPITAL_COMMUNITY): Payer: 59 | Admitting: Licensed Clinical Social Worker

## 2023-06-11 DIAGNOSIS — F411 Generalized anxiety disorder: Secondary | ICD-10-CM

## 2023-06-11 DIAGNOSIS — R4589 Other symptoms and signs involving emotional state: Secondary | ICD-10-CM

## 2023-06-11 DIAGNOSIS — F332 Major depressive disorder, recurrent severe without psychotic features: Secondary | ICD-10-CM

## 2023-06-12 ENCOUNTER — Other Ambulatory Visit (HOSPITAL_COMMUNITY): Payer: 59 | Admitting: Licensed Clinical Social Worker

## 2023-06-12 ENCOUNTER — Other Ambulatory Visit (HOSPITAL_COMMUNITY): Payer: 59

## 2023-06-12 ENCOUNTER — Telehealth (HOSPITAL_COMMUNITY): Payer: Self-pay | Admitting: Psychiatry

## 2023-06-12 DIAGNOSIS — F332 Major depressive disorder, recurrent severe without psychotic features: Secondary | ICD-10-CM

## 2023-06-12 DIAGNOSIS — R4589 Other symptoms and signs involving emotional state: Secondary | ICD-10-CM

## 2023-06-12 DIAGNOSIS — F411 Generalized anxiety disorder: Secondary | ICD-10-CM

## 2023-06-13 ENCOUNTER — Other Ambulatory Visit (HOSPITAL_COMMUNITY): Payer: 59

## 2023-06-13 ENCOUNTER — Encounter (HOSPITAL_COMMUNITY): Payer: Self-pay

## 2023-06-13 ENCOUNTER — Other Ambulatory Visit (HOSPITAL_COMMUNITY): Payer: 59 | Admitting: Licensed Clinical Social Worker

## 2023-06-13 DIAGNOSIS — R4589 Other symptoms and signs involving emotional state: Secondary | ICD-10-CM

## 2023-06-13 DIAGNOSIS — F332 Major depressive disorder, recurrent severe without psychotic features: Secondary | ICD-10-CM | POA: Diagnosis not present

## 2023-06-13 DIAGNOSIS — F411 Generalized anxiety disorder: Secondary | ICD-10-CM

## 2023-06-13 NOTE — Therapy (Signed)
[facility] PARTIAL HOSPITALIZATION PROGRAM [address] [place], [place], [address] Phone: [phone]   Fax:  [phone]  Occupational Therapy Treatment Virtual Visit via Video Note  I connected with Si Raider Foskett on 06/13/23 at  8:00 AM EDT by a video enabled telemedicine application and verified that I am speaking with the correct person using two identifiers.  Location: Patient: home Provider: office   I discussed the limitations of evaluation and management by telemedicine and the availability of in person appointments. The patient expressed understanding and agreed to proceed.    The patient was advised to call back or seek an in-person evaluation if the symptoms worsen or if the condition fails to improve as anticipated.  I provided 55 minutes of non-face-to-face time during this encounter.   Patient Details  Name: Micheal Roth MRN: [id number] Date of Birth: [birth year] No data recorded  Encounter Date: 06/12/2023   OT End of Session - 06/13/23 0926     Visit Number 11    Number of Visits 20    Date for OT Re-Evaluation 06/24/23    OT Start Time 1200    OT Stop Time 1255    OT Time Calculation (min) 55 min             Past Medical History:  Diagnosis Date   Arthritis    Chronic back pain    Chronic left shoulder pain    Chronic neck pain    Depression    GERD (gastroesophageal reflux disease)    History of shingles    Migraines    Nausea and vomiting 04/09/2017   Paresthesia of left arm and leg    and weakness of left arm and leg   Seizures (HCC)    not in years    Past Surgical History:  Procedure Laterality Date   BACK SURGERY     KNEE ARTHROSCOPY WITH LATERAL MENISECTOMY Right 05/18/2022   Procedure: KNEE ARTHROSCOPY WITH CHONDROPLASTY  AND DEBRIDEMENT;  Surgeon: Yolonda Kida, MD;  Location: [facility];  Service: Orthopedics;  Laterality: Right;  62   KNEE SURGERY     TUMOR REMOVAL Bilateral 1980    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   Subjective Assessment - 06/13/23 0926     Currently in Pain? No/denies    Pain Score 0-No pain               Group Session:  S: Feeling good today.   O: The primary objective of this topic is to explore and understand the concept of occupational balance in the context of daily living. The term "occupational balance" is defined broadly, encompassing all activities that occupy an individual's time and energy, including self-care, leisure, and work-related tasks. The goal is to guide participants towards achieving a harmonious blend of these activities, tailored to their personal values and life circumstances. This balance is aimed at enhancing overall well-being, not by equally distributing time across activities, but by ensuring that daily engagements are fulfilling and not draining. The content delves into identifying various barriers that individuals face in achieving occupational balance, such as overcommitment, misaligned priorities, external pressures, and lack of effective time management. The impact of these barriers on occupational performance, roles, and lifestyles is examined, highlighting issues like reduced efficiency, strained relationships, and potential health problems. Strategies for cultivating occupational balance are a key focus. These strategies include practical methods  like time blocking, prioritizing tasks, establishing self-care rituals, decluttering, connecting with nature, and engaging in reflective practices. These approaches are designed to be adaptable and applicable to a wide range of life scenarios, promoting a proactive and mindful approach to daily living. The overall aim is to equip participants with the knowledge and tools to create a balanced lifestyle that supports their mental, emotional, and physical health, thereby improving their functional performance in  daily life.   A:  The patient demonstrated a high level of engagement and active participation throughout the session on occupational balance. The patient frequently contributed to discussions, offering insightful reflections on personal experiences related to the barriers and strategies for achieving occupational balance. There was a clear understanding of the concept and an ability to relate it to their own life. The patient showed enthusiasm in learning and applying the strategies discussed, such as time blocking and self-care rituals, indicating a strong motivation to improve their occupational balance. The patient's proactive approach and responsiveness to the topic suggest a high potential for implementing these strategies effectively in their daily routine.   P: Continue to attend PHP OT group sessions 5x week for 4 weeks to promote daily structure, social engagement, and opportunities to develop and utilize adaptive strategies to maximize functional performance in preparation for safe transition and integration back into school, work, and the community. Plan to address topic of tbd in next OT group session.                    OT Education - 06/13/23 0926     Education Details Occupational Balance              OT Short Term Goals - 05/25/23 1127       OT SHORT TERM GOAL #1   Title Patient will be educated on strategies to improve psychosocial skills needed to participate fully in all daily, work, and leisure activities.    Time 4    Period Weeks    Status On-going    Target Date 06/24/23      OT SHORT TERM GOAL #2   Title Pt will apply psychosocial skills and coping mechanisms to daily activities in order to function independently and reintegrate into community dwelling    Status On-going      OT SHORT TERM GOAL #3   Title Pt will choose and/or engage in 1-3 socially engaging leisure activities to improve social participation skills upon reintegrating into  community    Status On-going                      Plan - 06/13/23 0926     Psychosocial Skills Coping Strategies;Habits;Routines and Behaviors;Interpersonal Interaction             Patient will benefit from skilled therapeutic intervention in order to improve the following deficits and impairments:       Psychosocial Skills: Coping Strategies, Habits, Routines and Behaviors, Interpersonal Interaction   Visit Diagnosis: Difficulty coping    Problem List Patient Active Problem List   Diagnosis Date Noted   MDD (major depressive disorder), recurrent episode, severe (HCC) 05/21/2023   GAD (generalized anxiety disorder) 05/21/2023   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   Ocular pain, right eye-  actually periorbital 01/23/2018   Exertional epigastric/chest pain 05/03/2017   H/O: substance abuse (HCC) 05/03/2017   Nausea and vomiting 04/09/2017   Elevated triglycerides with high cholesterol 03/22/2017   Low serum HDL 03/22/2017   Myalgia 03/22/2017  Obesity, Class I, BMI 30-34.9 02/22/2017   Drug abuse, smokes marijuana qd 02/22/2017   Chronic heartburn 02/22/2017   Epigastric abdominal pain 02/22/2017   h/o Seizures (HCC) 04/26/2016   History of many Head concussions 04/26/2016   h/o Chronic neck pain 04/26/2016   History of shingles 04/26/2016   Neuralgia, postherpetic- R L Ext 04/26/2016   Edema of right lower extremity 04/26/2016   h/o Weakness of left arm 07/14/2013   h/o Paresthesia of left arm 07/14/2013   h/o Neck pain on left side 03/17/2013   h/o Pain in joint, R shoulder region 03/17/2013   Acute confusional state 12/18/2012   h/o TOBACCO USER 05/10/2009   h/o Migraine 02/15/2009   h/o chronic LOW BACK PAIN 07/10/2008   Adjustment disorder with mixed anxiety and depressed mood 07/04/2007    Ted Mcalpine, OT 06/13/2023, 9:27 AM  Kerrin Champagne, OT   [facility] HOSPITALIZATION PROGRAM [address] [place], [place], [address] Phone: [phone]   Fax:  [phone]  Name: TRAMAR BRUECKNER MRN: [id number] Date of Birth: 1976/05/06

## 2023-06-13 NOTE — Therapy (Signed)
[facility] PARTIAL HOSPITALIZATION PROGRAM [address] [place], [place], [address] Phone: [phone]   Fax:  [phone]  Occupational Therapy Treatment Virtual Visit via Video Note  I connected with Micheal Roth on 06/13/23 at  8:00 AM EDT by a video enabled telemedicine application and verified that I am speaking with the correct person using two identifiers.  Location: Patient: home Provider: office   I discussed the limitations of evaluation and management by telemedicine and the availability of in person appointments. The patient expressed understanding and agreed to proceed.    The patient was advised to call back or seek an in-person evaluation if the symptoms worsen or if the condition fails to improve as anticipated.  I provided 55 minutes of non-face-to-face time during this encounter.   Patient Details  Name: Micheal Roth MRN: [id number] Date of Birth: 30-Jun-1976 No data recorded  Encounter Date: 06/11/2023   OT End of Session - 06/13/23 0911     Visit Number 10    Number of Visits 20    Date for OT Re-Evaluation 06/24/23    OT Start Time 1200    OT Stop Time 1255    OT Time Calculation (min) 55 min             Past Medical History:  Diagnosis Date   Arthritis    Chronic back pain    Chronic left shoulder pain    Chronic neck pain    Depression    GERD (gastroesophageal reflux disease)    History of shingles    Migraines    Nausea and vomiting 04/09/2017   Paresthesia of left arm and leg    and weakness of left arm and leg   Seizures (HCC)    not in years    Past Surgical History:  Procedure Laterality Date   BACK SURGERY     KNEE ARTHROSCOPY WITH LATERAL MENISECTOMY Right 05/18/2022   Procedure: KNEE ARTHROSCOPY WITH CHONDROPLASTY  AND DEBRIDEMENT;  Surgeon: Yolonda Kida, MD;  Location: [facility];  Service: Orthopedics;  Laterality: Right;  62   KNEE SURGERY     TUMOR REMOVAL Bilateral 1980    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   Subjective Assessment - 06/13/23 0911     Currently in Pain? No/denies    Pain Score 0-No pain                   Group Session:  S: Feeling better today.   O: The primary objective of this topic is to explore and understand the concept of occupational balance in the context of daily living. The term "occupational balance" is defined broadly, encompassing all activities that occupy an individual's time and energy, including self-care, leisure, and work-related tasks. The goal is to guide participants towards achieving a harmonious blend of these activities, tailored to their personal values and life circumstances. This balance is aimed at enhancing overall well-being, not by equally distributing time across activities, but by ensuring that daily engagements are fulfilling and not draining. The content delves into identifying various barriers that individuals face in achieving occupational balance, such as overcommitment, misaligned priorities, external pressures, and lack of effective time management. The impact of these barriers on occupational performance, roles, and lifestyles is examined, highlighting issues like reduced efficiency, strained relationships, and potential health problems. Strategies for cultivating occupational balance are a key focus. These  strategies include practical methods like time blocking, prioritizing tasks, establishing self-care rituals, decluttering, connecting with nature, and engaging in reflective practices. These approaches are designed to be adaptable and applicable to a wide range of life scenarios, promoting a proactive and mindful approach to daily living. The overall aim is to equip participants with the knowledge and tools to create a balanced lifestyle that supports their mental, emotional, and physical health, thereby improving their functional  performance in daily life.   A:  The patient demonstrated a high level of engagement and active participation throughout the session on occupational balance. The patient frequently contributed to discussions, offering insightful reflections on personal experiences related to the barriers and strategies for achieving occupational balance. There was a clear understanding of the concept and an ability to relate it to their own life. The patient showed enthusiasm in learning and applying the strategies discussed, such as time blocking and self-care rituals, indicating a strong motivation to improve their occupational balance. The patient's proactive approach and responsiveness to the topic suggest a high potential for implementing these strategies effectively in their daily routine.   P: Continue to attend PHP OT group sessions 5x week for 4 weeks to promote daily structure, social engagement, and opportunities to develop and utilize adaptive strategies to maximize functional performance in preparation for safe transition and integration back into school, work, and the community. Plan to address topic of tbd in next OT group session.                OT Education - 06/13/23 0911     Education Details Occupational Balance              OT Short Term Goals - 05/25/23 1127       OT SHORT TERM GOAL #1   Title Patient will be educated on strategies to improve psychosocial skills needed to participate fully in all daily, work, and leisure activities.    Time 4    Period Weeks    Status On-going    Target Date 06/24/23      OT SHORT TERM GOAL #2   Title Pt will apply psychosocial skills and coping mechanisms to daily activities in order to function independently and reintegrate into community dwelling    Status On-going      OT SHORT TERM GOAL #3   Title Pt will choose and/or engage in 1-3 socially engaging leisure activities to improve social participation skills upon reintegrating  into community    Status On-going                      Plan - 06/13/23 0912     Psychosocial Skills Coping Strategies;Habits;Routines and Behaviors;Interpersonal Interaction             Patient will benefit from skilled therapeutic intervention in order to improve the following deficits and impairments:       Psychosocial Skills: Coping Strategies, Habits, Routines and Behaviors, Interpersonal Interaction   Visit Diagnosis: Difficulty coping    Problem List Patient Active Problem List   Diagnosis Date Noted   MDD (major depressive disorder), recurrent episode, severe (HCC) 05/21/2023   GAD (generalized anxiety disorder) 05/21/2023   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   Ocular pain, right eye-  actually periorbital 01/23/2018   Exertional epigastric/chest pain 05/03/2017   H/O: substance abuse (HCC) 05/03/2017   Nausea and vomiting 04/09/2017   Elevated triglycerides with high cholesterol 03/22/2017   Low serum HDL 03/22/2017   Myalgia 03/22/2017  Obesity, Class I, BMI 30-34.9 02/22/2017   Drug abuse, smokes marijuana qd 02/22/2017   Chronic heartburn 02/22/2017   Epigastric abdominal pain 02/22/2017   h/o Seizures (HCC) 04/26/2016   History of many Head concussions 04/26/2016   h/o Chronic neck pain 04/26/2016   History of shingles 04/26/2016   Neuralgia, postherpetic- R L Ext 04/26/2016   Edema of right lower extremity 04/26/2016   h/o Weakness of left arm 07/14/2013   h/o Paresthesia of left arm 07/14/2013   h/o Neck pain on left side 03/17/2013   h/o Pain in joint, R shoulder region 03/17/2013   Acute confusional state 12/18/2012   h/o TOBACCO USER 05/10/2009   h/o Migraine 02/15/2009   h/o chronic LOW BACK PAIN 07/10/2008   Adjustment disorder with mixed anxiety and depressed mood 07/04/2007    Ted Mcalpine, OT 06/13/2023, 9:12 AM  Kerrin Champagne, OT   [facility] HOSPITALIZATION PROGRAM [address] [place], [place], [address] Phone: [phone]   Fax:  [phone]  Name: Micheal Roth MRN: [id number] Date of Birth: [birth year]

## 2023-06-14 ENCOUNTER — Other Ambulatory Visit (HOSPITAL_COMMUNITY): Payer: 59

## 2023-06-14 ENCOUNTER — Other Ambulatory Visit (HOSPITAL_COMMUNITY): Payer: 59 | Admitting: Licensed Clinical Social Worker

## 2023-06-14 DIAGNOSIS — F332 Major depressive disorder, recurrent severe without psychotic features: Secondary | ICD-10-CM | POA: Diagnosis not present

## 2023-06-14 DIAGNOSIS — F411 Generalized anxiety disorder: Secondary | ICD-10-CM

## 2023-06-15 ENCOUNTER — Encounter (HOSPITAL_COMMUNITY): Payer: Self-pay | Admitting: Family

## 2023-06-15 ENCOUNTER — Other Ambulatory Visit (HOSPITAL_COMMUNITY): Payer: 59 | Admitting: Licensed Clinical Social Worker

## 2023-06-15 ENCOUNTER — Other Ambulatory Visit (HOSPITAL_COMMUNITY): Payer: 59

## 2023-06-15 DIAGNOSIS — F332 Major depressive disorder, recurrent severe without psychotic features: Secondary | ICD-10-CM

## 2023-06-15 DIAGNOSIS — F411 Generalized anxiety disorder: Secondary | ICD-10-CM

## 2023-06-15 DIAGNOSIS — R4589 Other symptoms and signs involving emotional state: Secondary | ICD-10-CM

## 2023-06-15 MED ORDER — HYDROXYZINE HCL 50 MG PO TABS: 50.0000 mg | ORAL_TABLET | Freq: Four times a day (QID) | ORAL | 1 refills | Status: DC | PRN
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
## 2023-06-18 ENCOUNTER — Other Ambulatory Visit (HOSPITAL_COMMUNITY): Payer: 59

## 2023-06-18 ENCOUNTER — Encounter (HOSPITAL_COMMUNITY): Payer: Self-pay

## 2023-06-18 NOTE — Therapy (Signed)
[facility] PARTIAL HOSPITALIZATION PROGRAM [address] [place], [place], [address] Phone: [phone]   Fax:  [phone]  Occupational Therapy Treatment Virtual Visit via Video Note  I connected with Micheal Roth on 06/18/23 at  8:00 AM EDT by a video enabled telemedicine application and verified that I am speaking with the correct person using two identifiers.  Location: Patient: home Provider: office   I discussed the limitations of evaluation and management by telemedicine and the availability of in person appointments. The patient expressed understanding and agreed to proceed.    The patient was advised to call back or seek an in-person evaluation if the symptoms worsen or if the condition fails to improve as anticipated.  I provided 55 minutes of non-face-to-face time during this encounter.   Patient Details  Name: Micheal Roth MRN: [id number] Date of Birth: 07/23/1976 No data recorded  Encounter Date: 06/13/2023   OT End of Session - 06/18/23 0846     Visit Number 12    Number of Visits 20    Date for OT Re-Evaluation 06/24/23    OT Start Time 1200    OT Stop Time 1255    OT Time Calculation (min) 55 min             Past Medical History:  Diagnosis Date   Arthritis    Chronic back pain    Chronic left shoulder pain    Chronic neck pain    Depression    GERD (gastroesophageal reflux disease)    History of shingles    Migraines    Nausea and vomiting 04/09/2017   Paresthesia of left arm and leg    and weakness of left arm and leg   Seizures (HCC)    not in years    Past Surgical History:  Procedure Laterality Date   BACK SURGERY     KNEE ARTHROSCOPY WITH LATERAL MENISECTOMY Right 05/18/2022   Procedure: KNEE ARTHROSCOPY WITH CHONDROPLASTY  AND DEBRIDEMENT;  Surgeon: Yolonda Kida, MD;  Location: [facility];  Service: Orthopedics;  Laterality: Right;  62   KNEE SURGERY     TUMOR REMOVAL Bilateral 1980    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   Subjective Assessment - 06/18/23 0846     Currently in Pain? No/denies    Pain Score 0-No pain                  Group Session:  S: Doing better today   O: The primary objective of this topic is to explore and understand the concept of occupational balance in the context of daily living. The term "occupational balance" is defined broadly, encompassing all activities that occupy an individual's time and energy, including self-care, leisure, and work-related tasks. The goal is to guide participants towards achieving a harmonious blend of these activities, tailored to their personal values and life circumstances. This balance is aimed at enhancing overall well-being, not by equally distributing time across activities, but by ensuring that daily engagements are fulfilling and not draining. The content delves into identifying various barriers that individuals face in achieving occupational balance, such as overcommitment, misaligned priorities, external pressures, and lack of effective time management. The impact of these barriers on occupational performance, roles, and lifestyles is examined, highlighting issues like reduced efficiency, strained relationships, and potential health problems. Strategies for cultivating occupational balance are a key focus. These strategies  include practical methods like time blocking, prioritizing tasks, establishing self-care rituals, decluttering, connecting with nature, and engaging in reflective practices. These approaches are designed to be adaptable and applicable to a wide range of life scenarios, promoting a proactive and mindful approach to daily living. The overall aim is to equip participants with the knowledge and tools to create a balanced lifestyle that supports their mental, emotional, and physical health, thereby improving their functional  performance in daily life.   A:  The patient demonstrated a high level of engagement and active participation throughout the session on occupational balance. The patient frequently contributed to discussions, offering insightful reflections on personal experiences related to the barriers and strategies for achieving occupational balance. There was a clear understanding of the concept and an ability to relate it to their own life. The patient showed enthusiasm in learning and applying the strategies discussed, such as time blocking and self-care rituals, indicating a strong motivation to improve their occupational balance. The patient's proactive approach and responsiveness to the topic suggest a high potential for implementing these strategies effectively in their daily routine.    P: Continue to attend PHP OT group sessions 5x week for 4 weeks to promote daily structure, social engagement, and opportunities to develop and utilize adaptive strategies to maximize functional performance in preparation for safe transition and integration back into school, work, and the community. Plan to address topic of tbd in next OT group session.                 OT Education - 06/18/23 0846     Education Details Occupational Balance              OT Short Term Goals - 05/25/23 1127       OT SHORT TERM GOAL #1   Title Patient will be educated on strategies to improve psychosocial skills needed to participate fully in all daily, work, and leisure activities.    Time 4    Period Weeks    Status On-going    Target Date 06/24/23      OT SHORT TERM GOAL #2   Title Pt will apply psychosocial skills and coping mechanisms to daily activities in order to function independently and reintegrate into community dwelling    Status On-going      OT SHORT TERM GOAL #3   Title Pt will choose and/or engage in 1-3 socially engaging leisure activities to improve social participation skills upon  reintegrating into community    Status On-going                      Plan - 06/18/23 0846     Psychosocial Skills Coping Strategies;Habits;Routines and Behaviors;Interpersonal Interaction             Patient will benefit from skilled therapeutic intervention in order to improve the following deficits and impairments:       Psychosocial Skills: Coping Strategies, Habits, Routines and Behaviors, Interpersonal Interaction   Visit Diagnosis: Difficulty coping    Problem List Patient Active Problem List   Diagnosis Date Noted   MDD (major depressive disorder), recurrent episode, severe (HCC) 05/21/2023   GAD (generalized anxiety disorder) 05/21/2023   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   Ocular pain, right eye-  actually periorbital 01/23/2018   Exertional epigastric/chest pain 05/03/2017   H/O: substance abuse (HCC) 05/03/2017   Nausea and vomiting 04/09/2017   Elevated triglycerides with high cholesterol 03/22/2017   Low serum HDL 03/22/2017   Myalgia  03/22/2017   Obesity, Class I, BMI 30-34.9 02/22/2017   Drug abuse, smokes marijuana qd 02/22/2017   Chronic heartburn 02/22/2017   Epigastric abdominal pain 02/22/2017   h/o Seizures (HCC) 04/26/2016   History of many Head concussions 04/26/2016   h/o Chronic neck pain 04/26/2016   History of shingles 04/26/2016   Neuralgia, postherpetic- R L Ext 04/26/2016   Edema of right lower extremity 04/26/2016   h/o Weakness of left arm 07/14/2013   h/o Paresthesia of left arm 07/14/2013   h/o Neck pain on left side 03/17/2013   h/o Pain in joint, R shoulder region 03/17/2013   Acute confusional state 12/18/2012   h/o TOBACCO USER 05/10/2009   h/o Migraine 02/15/2009   h/o chronic LOW BACK PAIN 07/10/2008   Adjustment disorder with mixed anxiety and depressed mood 07/04/2007    Ted Mcalpine, OT 06/18/2023, 8:47 AM Kerrin Champagne, OT   [facility] HOSPITALIZATION PROGRAM [address] [place], [place], [address] Phone: [phone]   Fax:  [phone]  Name: Micheal Roth MRN: [id number] Date of Birth: 29-Nov-1975

## 2023-06-18 NOTE — Therapy (Signed)
[facility] PARTIAL HOSPITALIZATION PROGRAM [address] [place], [place], [address] Phone: [phone]   Fax:  [phone]  Occupational Therapy Treatment Virtual Visit via Video Note  I connected with Micheal Roth on 06/18/23 at  8:00 AM EDT by a video enabled telemedicine application and verified that I am speaking with the correct person using two identifiers.  Location: Patient: home Provider: office   I discussed the limitations of evaluation and management by telemedicine and the availability of in person appointments. The patient expressed understanding and agreed to proceed.    The patient was advised to call back or seek an in-person evaluation if the symptoms worsen or if the condition fails to improve as anticipated.  I provided 55 minutes of non-face-to-face time during this encounter.   Patient Details  Name: Micheal Roth MRN: [id number] Date of Birth: 04/02/1976 No data recorded  Encounter Date: 06/15/2023   OT End of Session - 06/18/23 0909     Visit Number 13    Number of Visits 20    Date for OT Re-Evaluation 06/24/23    OT Start Time 1200    OT Stop Time 1255    OT Time Calculation (min) 55 min             Past Medical History:  Diagnosis Date   Arthritis    Chronic back pain    Chronic left shoulder pain    Chronic neck pain    Depression    GERD (gastroesophageal reflux disease)    History of shingles    Migraines    Nausea and vomiting 04/09/2017   Paresthesia of left arm and leg    and weakness of left arm and leg   Seizures (HCC)    not in years    Past Surgical History:  Procedure Laterality Date   BACK SURGERY     KNEE ARTHROSCOPY WITH LATERAL MENISECTOMY Right 05/18/2022   Procedure: KNEE ARTHROSCOPY WITH CHONDROPLASTY  AND DEBRIDEMENT;  Surgeon: Yolonda Kida, MD;  Location: [facility];  Service: Orthopedics;  Laterality: Right;  62   KNEE SURGERY     TUMOR REMOVAL Bilateral 1980    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   Subjective Assessment - 06/18/23 0908     Currently in Pain? No/denies    Pain Score 0-No pain               OCCUPATIONAL THERAPY DISCHARGE SUMMARY  Visits from Start of Care: 13  Current functional level related to goals / functional outcomes: Pt has met all OT goals and will be discharged on this date.    Remaining deficits: There are no remaining OT deficits at this time.    Plan: Patient is being discharged due to meeting the stated rehab goals.       Group Session:   O: During the group therapy session, the occupational therapist discussed the impact of sleep disturbances on daily activities and overall health and wellbeing.   The OT also reviewed various types of sleep disorders, including insomnia, sleep apnea, restless leg syndrome, and narcolepsy, and their associated symptoms. Strategies for managing and treating sleep disturbances were also discussed, such as establishing a consistent sleep routine, avoiding stimulants before bedtime, and engaging in relaxation techniques.   Today's group also included information on how sleep disturbances can cause fatigue, mood changes, cognitive impairment, and physical health problems,  and emphasizes the importance of seeking prompt treatment to maintain overall health and wellbeing.   A: In today's session, the patient demonstrated active engagement with the topic of The Importance of Sleep. They eagerly asked questions, contributed personal experiences, and showcased a noticeable eagerness to apply the discussed principles. Their participation indicated not only a strong understanding of the subject matter but also an intrinsic motivation to implement better sleep practices in their daily routine. Based on their proactive involvement, it is assessed that the patient greatly benefited from today's treatment and will likely make  efforts to incorporate the insights gained.                      OT Education - 06/18/23 0909     Education Details Sleep              OT Short Term Goals - 05/25/23 1127       OT SHORT TERM GOAL #1   Title Patient will be educated on strategies to improve psychosocial skills needed to participate fully in all daily, work, and leisure activities.    Time 4    Period Weeks    Status On-going    Target Date 06/24/23      OT SHORT TERM GOAL #2   Title Pt will apply psychosocial skills and coping mechanisms to daily activities in order to function independently and reintegrate into community dwelling    Status On-going      OT SHORT TERM GOAL #3   Title Pt will choose and/or engage in 1-3 socially engaging leisure activities to improve social participation skills upon reintegrating into community    Status On-going                      Plan - 06/18/23 0909     Psychosocial Skills Coping Strategies;Habits;Routines and Behaviors;Interpersonal Interaction             Patient will benefit from skilled therapeutic intervention in order to improve the following deficits and impairments:       Psychosocial Skills: Coping Strategies, Habits, Routines and Behaviors, Interpersonal Interaction   Visit Diagnosis: Difficulty coping    Problem List Patient Active Problem List   Diagnosis Date Noted   MDD (major depressive disorder), recurrent episode, severe (HCC) 05/21/2023   GAD (generalized anxiety disorder) 05/21/2023   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   Ocular pain, right eye-  actually periorbital 01/23/2018   Exertional epigastric/chest pain 05/03/2017   H/O: substance abuse (HCC) 05/03/2017   Nausea and vomiting 04/09/2017   Elevated triglycerides with high cholesterol 03/22/2017   Low serum HDL 03/22/2017   Myalgia 03/22/2017   Obesity, Class I, BMI 30-34.9 02/22/2017   Drug abuse, smokes marijuana qd 02/22/2017   Chronic heartburn  02/22/2017   Epigastric abdominal pain 02/22/2017   h/o Seizures (HCC) 04/26/2016   History of many Head concussions 04/26/2016   h/o Chronic neck pain 04/26/2016   History of shingles 04/26/2016   Neuralgia, postherpetic- R L Ext 04/26/2016   Edema of right lower extremity 04/26/2016   h/o Weakness of left arm 07/14/2013   h/o Paresthesia of left arm 07/14/2013   h/o Neck pain on left side 03/17/2013   h/o Pain in joint, R shoulder region 03/17/2013   Acute confusional state 12/18/2012   h/o TOBACCO USER 05/10/2009   h/o Migraine 02/15/2009   h/o chronic LOW BACK PAIN 07/10/2008   Adjustment disorder with mixed anxiety and depressed mood  07/04/2007    Ted Mcalpine, OT 06/18/2023, 9:09 AM  Kerrin Champagne, OT   [facility] [address] [place], [place], [address] Phone: [phone]   Fax:  [phone]  Name: Micheal Roth MRN: [id number] Date of Birth: [birth year]

## 2023-06-19 ENCOUNTER — Encounter (HOSPITAL_COMMUNITY): Payer: Self-pay | Admitting: Psychiatry

## 2023-06-19 ENCOUNTER — Other Ambulatory Visit (HOSPITAL_COMMUNITY): Payer: 59

## 2023-06-19 ENCOUNTER — Other Ambulatory Visit (HOSPITAL_COMMUNITY): Payer: 59 | Attending: Psychiatry | Admitting: Family

## 2023-06-19 ENCOUNTER — Encounter (HOSPITAL_COMMUNITY): Payer: Self-pay

## 2023-06-19 DIAGNOSIS — F332 Major depressive disorder, recurrent severe without psychotic features: Secondary | ICD-10-CM | POA: Insufficient documentation

## 2023-06-19 DIAGNOSIS — F411 Generalized anxiety disorder: Secondary | ICD-10-CM | POA: Diagnosis present
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
## 2023-06-19 NOTE — Progress Notes (Signed)
Virtual Visit via Video Note   I connected with Micheal Roth on 06/19/23 at  9:00 AM EDT by a video enabled telemedicine application and verified that I am speaking with the correct person using two identifiers.   At orientation to the IOP program, Case Manager discussed the limitations of evaluation and management by telemedicine and the availability of in person appointments. The patient expressed understanding and agreed to proceed with virtual visits throughout the duration of the program.   Location:  Patient: Patient Home Provider: [facility] Office   History of Present Illness: MDD and GAD   Observations/Objective: Check In: Case Manager checked in with all participants to review discharge dates, insurance authorizations, work-related documents and needs from the treatment team regarding medications. Micheal Roth stated needs and engaged in discussion.    Initial Therapeutic Activity: Counselor facilitated a check-in with Micheal Roth to assess for safety, sobriety and medication compliance.  Counselor also inquired about Micheal Roth's current emotional ratings, as well as any significant changes in thoughts, feelings or behavior since previous check in.  Micheal Roth presented for session on time and was alert, oriented x5, with no evidence or self-report of active SI/HI or A/V H.  Micheal Roth reported compliance with medication and denied use of alcohol or illicit substances.  Micheal Roth reported scores of 1/10 for depression, 5/10 for anxiety, and 0/10 for anger/irritability.  Micheal Roth denied any recent outbursts or panic attacks.  Micheal Roth reported that a recent success was completing PHP and deciding to continue with treatment by starting MHIOP today.  Micheal Roth reported that Micheal Roth had been struggling with anxiety and depression, but has learned several new coping skills.  Micheal Roth reported that his goal today is to take time to rest since Micheal Roth had a busy weekend.        Second  Therapeutic Activity: Counselor introduced [employer], [place] Chaplain to provide psychoeducation on topic of Grief and Loss with members today.  Micheal Roth began discussion by checking in with the group about their baseline mood today, general thoughts on what grief means to them and how it has affected them personally in the past.  Micheal Roth provided information on how the process of grief/loss can differ depending upon one's unique culture, and categories of loss one could experience (i.e. loss of a person, animal, relationship, job, identity, etc).  Micheal Roth encouraged members to be mindful of how pervasive loss can be, and how to recognize signs which could indicate that this is having an impact on one's overall mental health and wellbeing.  Intervention effectiveness was mixed, as Micheal Roth did not directly engage in discussion with speaker on the subject,  but did participate in icebreaker activity, reporting that if Micheal Roth experienced a loss, it would be nice to take time in his schedule to attend concerts locally, since Micheal Roth likes a variety of music, and experiences a sense of supportive community in those settings.    Third Therapeutic Activity: Counselor provided demonstration of relaxation technique known as mindful breathing meditation to help members increase sense of calm, resiliency, and control.  Counselor guided members through process of getting comfortable, achieving a relaxed breathing rhythm, and focusing on this for several minutes, allowing troubling thoughts and feelings to come and go without rumination.  Counselor processed effectiveness of activity afterward in discussion with members, including how this impacted their mental state, whether it was difficult to stay focused, and if they plan to include it in self-care routine to improve day-to-day coping.  Intervention was effective, as evidenced by Micheal Roth  participating in exercise, and reporting that Micheal Roth found this activity to be useful,  and Micheal Roth would consider practicing it on his own if Micheal Roth can find a way to make a recording of the narration.  Micheal Roth stated "This got me out of my head and grounded me".    Assessment and Plan: Counselor recommends that Micheal Roth remain in IOP treatment to better manage mental health symptoms, ensure stability and pursue completion of treatment plan goals. Counselor recommends adherence to crisis/safety plan, taking medications as prescribed, and following up with medical professionals if any issues arise.    Follow Up Instructions: Counselor will send Webex link for session tomorrow.  Micheal Roth was advised to call back or seek an in-person evaluation if the symptoms worsen or if the condition fails to improve as anticipated.   Collaboration of Care:   Medication Management AEB Dr. Alfonse Flavors or Hillery Jacks, NP                                          Case Manager AEB Jeri Modena, CNA    Patient/Guardian was advised Release of Information must be obtained prior to any record release in order to collaborate their care with an outside provider. Patient/Guardian was advised if they have not already done so to contact the registration department to sign all necessary forms in order for [place] to release information regarding their care.    Consent: Patient/Guardian gives verbal consent for treatment and assignment of benefits for services provided during this visit. Patient/Guardian expressed understanding and agreed to proceed.   I provided 180 minutes of non-face-to-face time during this encounter.   Micheal Stain, LCSW, LCAS 06/19/23

## 2023-06-19 NOTE — Progress Notes (Signed)
Virtual Visit via Video Note  I connected with Micheal Roth on @TODAY @ at  9:00 AM EDT by a video enabled telemedicine application and verified that I am speaking with the correct person using two identifiers.  Location: Patient: at home Provider: at office   I discussed the limitations of evaluation and management by telemedicine and the availability of in person appointments. The patient expressed understanding and agreed to proceed.  I discussed the assessment and treatment plan with the patient. The patient was provided an opportunity to ask questions and all were answered. The patient agreed with the plan and demonstrated an understanding of the instructions.   The patient was advised to call back or seek an in-person evaluation if the symptoms worsen or if the condition fails to improve as anticipated.  I provided 30 minutes of non-face-to-face time during this encounter.   Micheal Roth, M.Ed,CNA   Patient ID: Micheal Roth, male   DOB: 05-24-1976, 47 y.o.   MRN: [id number] D:As per recent CCA states:  "Micheal Roth was referred to [facility] by [facility]. 1) MH: Micheal Roth reports he has increased anxiety, depression, and panic attacks. He has not been to work due to increased MH symptoms. 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 He has been unable to attend work for the last few weeks due to his MH symptoms. 4) Financial: He currently lives in a "family house" and does not have to pay rent. The house is being sold. He does not have a car. Lives at [address] [place] [address]. Micheal Roth reports 1-2 therapy sessions as a teen, and a few visits to [facility] recently, but denies any other treatment history. Denies attempts/SI/HI/AVH/weapons/NSSIB. Reports pSI, hopelessness, worthlessness. Denies supports. Reports he knows there is mental health issues on his father's side, but does not know what due to not  being close with that side of his family."  Pt stepped down from virtual PHP to virtual MH-IOP today.  Pt reports that the groups in PHP and the groups this morning in MH-IOP went well.  States he is back with his girlfriend.  She has agreed to seek couples counseling.  On a scale of 1-10 (10 being the worst), pt rates his depression at 2 and anxiety at a 5.  Denies SI/HI or A/V hallucinations.  Scored 17 on PHQ-9.  Reports that [employer] has him out of work through 07-04-23.  Pt will possibly need an extension, if still in MH-IOP during that time.   A:  Oriented pt. Collaboration of Care: Collaborate with Dr. Lamar Sprinkles AEB, Hillery Jacks, NP AEB; Noralee Stain, LCSW AEB.  Will refer pt to a psychiatrist and therapist. Patient/Guardian was advised Release of Information must be obtained prior to any record release in order to collaborate their care with an outside provider. Patient/Guardian was advised if they have not already done so to contact the registration department to sign all necessary forms in order for [place] to release information regarding their care.  Consent: Patient/Guardian gives verbal consent for treatment and assignment of benefits for services provided during this visit. Patient/Guardian expressed understanding and agreed to proceed.   Pt will improve his mood as evidenced by being happy again, managing his mood and coping with daily stressors for 5 out of 7 days for 60 days.   R:  Pt receptive.  Micheal Roth, M.Ed,CNA

## 2023-06-20 ENCOUNTER — Other Ambulatory Visit (HOSPITAL_COMMUNITY): Payer: 59

## 2023-06-20 DIAGNOSIS — F411 Generalized anxiety disorder: Secondary | ICD-10-CM

## 2023-06-20 DIAGNOSIS — F332 Major depressive disorder, recurrent severe without psychotic features: Secondary | ICD-10-CM

## 2023-06-20 NOTE — Progress Notes (Signed)
Virtual Visit via Video Note   I connected with Braelyn A. Tosh on 06/20/23 at  9:00 AM EDT by a video enabled telemedicine application and verified that I am speaking with the correct person using two identifiers.   At orientation to the IOP program, Case Manager discussed the limitations of evaluation and management by telemedicine and the availability of in person appointments. The patient expressed understanding and agreed to proceed with virtual visits throughout the duration of the program.   Location:  Patient: Patient Home Provider: [facility]   History of Present Illness: MDD and GAD   Observations/Objective: Check In: Case Manager checked in with all participants to review discharge dates, insurance authorizations, work-related documents and needs from the treatment team regarding medications. Knox stated needs and engaged in discussion.    Initial Therapeutic Activity: Counselor facilitated a check-in with Kush to assess for safety, sobriety and medication compliance.  Counselor also inquired about Dylyn's current emotional ratings, as well as any significant changes in thoughts, feelings or behavior since previous check in.  Josephmichael presented for session on time and was alert, oriented x5, with no evidence or self-report of active HI or A/V H.  Rakwon reported compliance with medication and denied use of alcohol or illicit substances.  Jadrian reported scores of 5/10 for depression, 5/10 for anxiety, and 0/10 for anger/irritability.  Covey denied any recent outbursts or panic attacks.  Archie reported that a recent success was watching a sports game with a friend yesterday for self-care.  Chinmay reported that a struggle was experiencing passive SI without intent or plan before group today.  He reported that he could contract for safety today, agreeing to go to the hospital for assessment if intent and/or plan to harm himself  develops.  Nahsir reported that his goal today is to attend a concert with his son for self-care.       Second Therapeutic Activity: Counselor offered to teach group members an ACT relaxation technique today to aid in managing difficult thoughts, feelings, urges, and sensations.  Counselor guided members through process of getting comfortable, achieving relaxing breathing rhythm, and then maintaining this throughout activity.  Counselor invited members to imagine a gently flowing stream in their mind with leaves floating upon it, and when any thoughts, feelings, urges, or sensations arose, good or bad, they were instructed to visualize placing them on these passing leaves over course of 15 minutes practice.  Intervention was ineffective, as evidenced by [name] participating in activity, but reporting that he visualized a stream near his job, and noticed anxiety rising as he began to thinking about eventually returning to work after [facility] is complete.  Wendall reported that he would try this activity again, but picture a stream not associated with work, and see if he feels differently.     Third Therapeutic Activity: Counselor introduced Celine Mans, [employer], to provide psychoeducation on topic of nutrition with members today.  Jae Dire virtually shared a comprehensive PowerPoint presentation to guide discussion, which featured the various components of healthy living that influence one's wellbeing, including practicing mindfulness, staying physically active, calm in mood, well-rested, and more.  Jae Dire explained how good nutrition reinforces positive physical and mental health, and shared a video which explained how food intake affects brain functioning in particular.  Jae Dire provided advice on how to adjust diet in order to promote wellbeing during course of treatment, including achieving balanced daily intake along with regular exercise.  Jae Dire offered the 'plate method' as a tool  for proper  distribution of protein, grains, starches, vegetables, fruit, and low calorie drink, in addition to concept of 'mindful eating', and covering current Dietary Guidelines for Americans for more tips.  Jae Dire inquired about changes members would like to make to their nutrition in order to increase overall wellbeing based upon information shared today, and time was allowed to ask any questions they might have of Jae Dire regarding her specialty.  Intervention effectiveness was mixed, as Franciscojavier did not participate in discussion with speaker on the subject, but he was observed participating in a stretch break guided by speaker.    Assessment and Plan: Counselor recommends that Musab remain in IOP treatment to better manage mental health symptoms, ensure stability and pursue completion of treatment plan goals. Counselor recommends adherence to crisis/safety plan, taking medications as prescribed, and following up with medical professionals if any issues arise.    Follow Up Instructions: Counselor will send Webex link for session tomorrow.  Yianni was advised to call back or seek an in-person evaluation if the symptoms worsen or if the condition fails to improve as anticipated.   Collaboration of Care:   Medication Management AEB Dr. Alfonse Flavors or Hillery Jacks, NP                                          Case Manager AEB Jeri Modena, CNA    Patient/Guardian was advised Release of Information must be obtained prior to any record release in order to collaborate their care with an outside provider. Patient/Guardian was advised if they have not already done so to contact the registration department to sign all necessary forms in order for [place] to release information regarding their care.    Consent: Patient/Guardian gives verbal consent for treatment and assignment of benefits for services provided during this visit. Patient/Guardian expressed understanding and agreed to proceed.   I provided 180 minutes of  non-face-to-face time during this encounter.   Noralee Stain, LCSW, LCAS 06/20/23

## 2023-06-21 ENCOUNTER — Other Ambulatory Visit (HOSPITAL_COMMUNITY): Payer: 59

## 2023-06-21 ENCOUNTER — Encounter (HOSPITAL_COMMUNITY): Payer: Self-pay

## 2023-06-22 ENCOUNTER — Other Ambulatory Visit (HOSPITAL_COMMUNITY): Payer: 59

## 2023-06-22 ENCOUNTER — Other Ambulatory Visit (HOSPITAL_COMMUNITY): Payer: 59 | Admitting: Psychiatry

## 2023-06-22 DIAGNOSIS — F332 Major depressive disorder, recurrent severe without psychotic features: Secondary | ICD-10-CM | POA: Diagnosis not present

## 2023-06-22 DIAGNOSIS — F411 Generalized anxiety disorder: Secondary | ICD-10-CM

## 2023-06-22 NOTE — Progress Notes (Signed)
Virtual Visit via Video Note   I connected with Micheal Roth on 06/22/23 at  9:00 AM EDT by a video enabled telemedicine application and verified that I am speaking with the correct person using two identifiers.   At orientation to the IOP program, Case Manager discussed the limitations of evaluation and management by telemedicine and the availability of in person appointments. The patient expressed understanding and agreed to proceed with virtual visits throughout the duration of the program.   Location:  Patient: Patient Home Provider: [facility]   History of Present Illness: MDD and GAD   Observations/Objective: Check In: Case Manager checked in with all participants to review discharge dates, insurance authorizations, work-related documents and needs from the treatment team regarding medications. Micheal Roth stated needs and engaged in discussion.    Initial Therapeutic Activity: Counselor facilitated a check-in with Micheal Roth to assess for safety, sobriety and medication compliance.  Counselor also inquired about Micheal Roth's current emotional ratings, as well as any significant changes in thoughts, feelings or behavior since previous check in.  Micheal Roth presented for session on time and was alert, oriented x5, with no evidence or self-report of active SI/HI or A/V H.  Micheal Roth reported compliance with medication and denied use of alcohol or illicit substances.  Micheal Roth reported scores of 5/10 for depression, 5/10 for anxiety, and 0/10 for anger/irritability.  Micheal Roth denied any recent outbursts or panic attacks.  Micheal Roth reported that a recent success was attending a concert with his son yesterday, stating "That was fun".  Micheal Roth reported that a struggle has been noticing some new physical health symptoms, stating "I'm not feeling great today.  My throat has been hurting".  Micheal Roth reported that his goal this weekend is to focus more on resting so he can  recover from recent symptoms.          Second Therapeutic Activity: Counselor introduced topic of assertive communication today.  Counselor shared various handouts with members virtually in group to read along with on the subject.  These handouts defined assertive communication as a communication style in which a person stands up for their own needs and wants, while also taking into consideration the needs and wants of others, without behaving in a passive or aggressive way.  Traits of assertive communicators were highlighted such as using appropriate speaking volume, maintaining eye contact, using confident language, and avoiding interruption.  Members were also provided with tips on how to improve communication, including respecting oneself, expressing thoughts and feelings calmly, and saying "No" when necessary.  Members were given a variety of scenarios where they could practice using these tips to respond in an assertive manner.  Intervention was effective, as evidenced by Micheal Roth participating in discussion on topic, reporting that he can have a passive communication style depending on the person he is dealing with, and context.  He reported that he has no problem speaking up about concerns, and maintaining eye contact with most people, but may let some people get their way to avoid conflict.  Micheal Roth reported that when dealing with his girlfriend, she can be aggressive, and antagonize him, so they have been considering couples counseling, and taking a temporary break while he works on his mental health.  He stated "Lately I've been more passive.  I just don't want to argue anymore".  Micheal Roth showed more effective use of assertive communication skills through engagement in roleplay activities.    Third Therapeutic Activity: Psycho-educational portion of group was provided by Virgina Evener, director of community  education with [employer].  Alexandra provided information on history of  her local agency, mission statement, and the variety of unique services offered which group members might find beneficial to engage in, including both virtual and in-person support groups, as well as peer support program for mentoring.  Alexandra offered time to answer member's questions regarding services and encouraged them to consider utilizing these services to assist in working towards their individual wellness goals.  Intervention effectiveness could not be measured, as client did not participate.    Assessment and Plan: Counselor recommends that Micheal Roth remain in IOP treatment to better manage mental health symptoms, ensure stability and pursue completion of treatment plan goals. Counselor recommends adherence to crisis/safety plan, taking medications as prescribed, and following up with medical professionals if any issues arise.    Follow Up Instructions: Counselor will send Webex link for session tomorrow.  Micheal Roth was advised to call back or seek an in-person evaluation if the symptoms worsen or if the condition fails to improve as anticipated.   Collaboration of Care:   Medication Management AEB Dr. Alfonse Flavors or Hillery Jacks, NP                                          Case Manager AEB Jeri Modena, CNA    Patient/Guardian was advised Release of Information must be obtained prior to any record release in order to collaborate their care with an outside provider. Patient/Guardian was advised if they have not already done so to contact the registration department to sign all necessary forms in order for [place] to release information regarding their care.    Consent: Patient/Guardian gives verbal consent for treatment and assignment of benefits for services provided during this visit. Patient/Guardian expressed understanding and agreed to proceed.   I provided 180 minutes of non-face-to-face time during this encounter.   Noralee Stain, LCSW, LCAS 06/22/23

## 2023-06-25 ENCOUNTER — Other Ambulatory Visit (HOSPITAL_COMMUNITY): Payer: 59

## 2023-06-25 ENCOUNTER — Telehealth (HOSPITAL_COMMUNITY): Payer: Self-pay | Admitting: Psychiatry

## 2023-06-26 ENCOUNTER — Other Ambulatory Visit (HOSPITAL_COMMUNITY): Payer: 59 | Admitting: Licensed Clinical Social Worker

## 2023-06-26 DIAGNOSIS — F411 Generalized anxiety disorder: Secondary | ICD-10-CM

## 2023-06-26 DIAGNOSIS — F332 Major depressive disorder, recurrent severe without psychotic features: Secondary | ICD-10-CM | POA: Diagnosis not present

## 2023-06-26 NOTE — Progress Notes (Signed)
Virtual Visit via Video Note   I connected with Micheal Roth on 06/26/23 at  9:00 AM EDT by a video enabled telemedicine application and verified that I am speaking with the correct person using two identifiers.   At orientation to the IOP program, Case Manager discussed the limitations of evaluation and management by telemedicine and the availability of in person appointments. The patient expressed understanding and agreed to proceed with virtual visits throughout the duration of the program.   Location:  Patient: [facility] Home Provider: [facility]   History of Present Illness: MDD and GAD   Observations/Objective: Check In: Case Manager checked in with all participants to review discharge dates, insurance authorizations, work-related documents and needs from the treatment team regarding medications. Micheal Roth stated needs and engaged in discussion.    Initial Therapeutic Activity: Counselor facilitated a check-in with Micheal Roth to assess for safety, sobriety and medication compliance.  Counselor also inquired about Micheal Roth's current emotional ratings, as well as any significant changes in thoughts, feelings or behavior since previous check in.  Micheal Roth presented for session on time and was alert, oriented x5, with no evidence or self-report of active SI/HI or A/V H.  Micheal Roth reported compliance with medication and denied use of alcohol or illicit substances.  Micheal Roth reported scores of 4/10 for depression, 4/10 for anxiety, and 5/10 for anger/irritability.  Micheal Roth denied any recent outbursts or panic attacks.  Micheal Roth reported that a struggle was missing group yesterday due to sickness, since he is currently on an antibiotic.  He reported that he feels worse today, but hoped to make it as long as possible in group to avoid missing another day.  Micheal Roth reported that his goal today is to take time to rest as he recovers from medication symptoms.         Second Therapeutic Activity: Counselor introduced topic of building a social support network today.  Counselor explained how this can be defined as having a having a group of healthy people in one's life you can talk to, spend time with, and get help from to improve both mental and physical health.  Counselor noted that some barriers can make it difficult to connect with other people, including the presence of anxiety or depression, or moving to an unfamiliar area.  Group members were asked to assess the current state of their support network, and identify ways that this could be improved.  Tips were given on how to address previously noted barriers, such as strengthening social skills, using relaxation techniques to reduce anxiety, scheduling social time each week, and/or exploring social events nearby which could increase chances of meeting new supports.  Members were also encouraged to consider getting closer to people they already know through suggestions such as outreaching someone by text, email or phone call if they haven't spoken in awhile, doing something nice for a friend/family member unexpectedly, and/or inviting someone over for a game/movie/dinner night.  Intervention effectiveness could not be measured, as Micheal Roth left group at break time to rest since he wasn't feeling physically well.    Assessment and Plan: Counselor recommends that Micheal Roth remain in IOP treatment to better manage mental health symptoms, ensure stability and pursue completion of treatment plan goals. Counselor recommends adherence to crisis/safety plan, taking medications as prescribed, and following up with medical professionals if any issues arise.    Follow Up Instructions: Counselor will send Webex link for session tomorrow.  Micheal Roth was advised to call back or seek an in-person evaluation if  the symptoms worsen or if the condition fails to improve as anticipated.   Collaboration of Care:   Medication  Management AEB Dr. Alfonse Flavors or Hillery Jacks, NP                                          Case Manager AEB Jeri Modena, CNA    Patient/Guardian was advised Release of Information must be obtained prior to any record release in order to collaborate their care with an outside provider. Patient/Guardian was advised if they have not already done so to contact the registration department to sign all necessary forms in order for [place] to release information regarding their care.    Consent: Patient/Guardian gives verbal consent for treatment and assignment of benefits for services provided during this visit. Patient/Guardian expressed understanding and agreed to proceed.   I provided 90 minutes of non-face-to-face time during this encounter.   Noralee Stain, [place], LCAS 06/26/23

## 2023-06-27 ENCOUNTER — Telehealth (HOSPITAL_COMMUNITY): Payer: Self-pay | Admitting: Psychiatry

## 2023-06-27 ENCOUNTER — Other Ambulatory Visit (HOSPITAL_COMMUNITY): Payer: 59 | Admitting: Psychiatry

## 2023-06-28 ENCOUNTER — Other Ambulatory Visit (HOSPITAL_COMMUNITY): Payer: 59 | Attending: Psychiatry | Admitting: Psychiatry

## 2023-06-28 ENCOUNTER — Other Ambulatory Visit (HOSPITAL_COMMUNITY): Payer: 59

## 2023-06-28 DIAGNOSIS — F411 Generalized anxiety disorder: Secondary | ICD-10-CM | POA: Insufficient documentation

## 2023-06-28 DIAGNOSIS — F329 Major depressive disorder, single episode, unspecified: Secondary | ICD-10-CM | POA: Insufficient documentation

## 2023-06-28 DIAGNOSIS — F332 Major depressive disorder, recurrent severe without psychotic features: Secondary | ICD-10-CM

## 2023-06-28 NOTE — Progress Notes (Signed)
Virtual Visit via Video Note   I connected with Micheal Roth on 06/28/23 at  9:00 AM EDT by a video enabled telemedicine application and verified that I am speaking with the correct person using two identifiers.   At orientation to the IOP program, Case Manager discussed the limitations of evaluation and management by telemedicine and the availability of in person appointments. The patient expressed understanding and agreed to proceed with virtual visits throughout the duration of the program.   Location:  Patient: Patient Home Provider: Home Office   History of Present Illness: MDD and GAD   Observations/Objective: Check In: Case Manager checked in with all participants to review discharge dates, insurance authorizations, work-related documents and needs from the treatment team regarding medications. Micheal Roth stated needs and engaged in discussion.    Initial Therapeutic Activity: Counselor facilitated a check-in with Micheal Roth to assess for safety, sobriety and medication compliance.  Counselor also inquired about Micheal Roth's current emotional ratings, as well as any significant changes in thoughts, feelings or behavior since previous check in.  Micheal Roth presented for session on time and was alert, oriented x5, with no evidence or self-report of active SI/HI or A/V H.  Micheal Roth reported compliance with medication and denied use of alcohol or illicit substances.  Micheal Roth reported scores of 2/10 for depression, 2/10 for anxiety, and 2/10 for anger/irritability.  Micheal Roth denied any recent outbursts or panic attacks.  Micheal Roth reported that a recent success was feeling better this morning as he continues recovering from sickness.  Micheal Roth reported that a struggle was dealing with some vertigo, which isn't uncommon for him due to past head trauma.  Micheal Roth reported that his goal today is to get out of the house and take a walk with his dog.          Second  Therapeutic Activity: Counselor engaged the group in discussion on managing work/life balance today to improve mental health and wellness.  Counselor explained how finding balance between responsibilities at home and work place can be challenging, and lead to increased stress.  Counselor facilitated discussion on what challenges members are currently, or have historically faced.  Counselor also discussed strategies for improving work/life balance while members work on their mental health during treatment.  Some of these included keeping track of time management; creating a list of priorities and scaling importance; setting realistic, measurable goals each day; establishing boundaries; taking care of health needs; and nurturing relationships at home and work for support.  Counselor inquired about areas where members feel they are excelling, as well as areas they could focus on during treatment. Intervention was effective, as evidenced by [name] actively participating in discussion on topic and reporting that his job had become very stressful and negatively impacted his mental health to the point where he decided to seek therapy.  [name] stated "Everything came together all at once and I had to go to [facility].  I've never dealt with anxiety like that before".    [name] reported that he experienced several symptoms of burnout, including feeling tired and drained, lacking motivation, reduced appetite, feeling overwhelmed, reduced sleep, and procrastination.  [name] reported that there have also been numerous warning signs such as waking up each morning and already feeling a sense of dread before work, and regularly pulling out of or saying "No" to social engagements.  [name] was receptive to suggestions offered today for addressing work life imbalance, including putting in new boundaries at his job to ensure adequate time in his schedule for  self-care, learning to say "No" to unreasonable demands,  reducing work hours from 10 hours per day down to 8, and staying committed to regular therapy after he finishes group.  He stated "I'm already implementing changes that will help me in the long haul".     Assessment and Plan: Counselor recommends that [name] remain in IOP treatment to better manage mental health symptoms, ensure stability and pursue completion of treatment plan goals. Counselor recommends adherence to crisis/safety plan, taking medications as prescribed, and following up with medical professionals if any issues arise.    Follow Up Instructions: Counselor will send Webex link for session tomorrow.  [name] was advised to call back or seek an in-person evaluation if the symptoms worsen or if the condition fails to improve as anticipated.   Collaboration of Care:   Medication Management AEB Dr. Alfonse Flavors or Hillery Jacks, NP                                          Case Manager AEB Jeri Modena, CNA    Patient/Guardian was advised Release of Information must be obtained prior to any record release in order to collaborate their care with an outside provider. Patient/Guardian was advised if they have not already done so to contact the registration department to sign all necessary forms in order for [place] to release information regarding their care.    Consent: Patient/Guardian gives verbal consent for treatment and assignment of benefits for services provided during this visit. Patient/Guardian expressed understanding and agreed to proceed.   I provided 180 minutes of non-face-to-face time during this encounter.   Noralee Stain, LCSW, LCAS 06/28/23

## 2023-06-29 ENCOUNTER — Other Ambulatory Visit (HOSPITAL_COMMUNITY): Payer: 59 | Admitting: Psychiatry

## 2023-06-29 DIAGNOSIS — F411 Generalized anxiety disorder: Secondary | ICD-10-CM

## 2023-06-29 DIAGNOSIS — F332 Major depressive disorder, recurrent severe without psychotic features: Secondary | ICD-10-CM

## 2023-06-29 NOTE — Progress Notes (Signed)
Virtual Visit via Video Note   I connected with Micheal Roth Roth on 06/29/23 at  9:00 AM EDT by a video enabled telemedicine application and verified that I am speaking with the correct person using two identifiers.   At orientation to the IOP program, Case Manager discussed the limitations of evaluation and management by telemedicine and the availability of in person appointments. The patient expressed understanding and agreed to proceed with virtual visits throughout the duration of the program.   Location:  Patient: Patient Home Provider: Home Office   History of Present Illness: MDD and GAD   Observations/Objective: Check In: Case Manager checked in with all participants to review discharge dates, insurance authorizations, work-related documents and needs from the treatment team regarding medications. Shelley stated needs and engaged in discussion.    Initial Therapeutic Activity: Counselor facilitated a check-in with Micheal Roth Roth to assess for safety, sobriety and medication compliance.  Counselor also inquired about Micheal Roth's current emotional ratings, as well as any significant changes in thoughts, feelings or behavior since previous check in.  Micheal Roth presented for session on time and was alert, oriented x5, with no evidence or self-report of active SI/HI or A/V H.  Micheal Roth Roth reported compliance with medication and denied use of alcohol or illicit substances.  Micheal Roth reported scores of 0/10 for depression, 0/10 for anxiety, and 0/10 for anger/irritability.  Micheal Roth denied any recent outbursts or panic attacks.  Micheal Roth reported that a recent success was doing some paperwork for his job, and spending time with his mother yesterday.  Micheal Roth denied any new struggles.  Micheal Roth reported that his goal this weekend is to attend a metal concert this weekend for self-care with some friends.          Second Therapeutic Activity: Counselor covered topic of  conflict resolution today.  Counselor virtually shared a handout on subject with members which warned against the 'four horsemen' of communication traps that should be avoided due to tendency to escalate and damage a relationship.  These included criticism, defensiveness, contempt, and stonewalling.  'Antidotes' to these harmful behaviors were offered as healthy replacements to improve communication and understanding, including approaching problems with a gentle startup approach, taking responsibility for one's behavior, sharing fondness/admiration, and using self-soothing to calm down and focus on the problem at hand.  Counselor encouraged members to share recent experiences with conflict that they have faced, which approach they utilized, and any changes that they would plan to implement in order to improve overall conflict resolution skills.  Intervention was effective, as evidenced by Micheal Roth Roth actively participating in discussion on topic, reporting that he has engaged in some of these communication traps, including criticism, defensiveness, and stonewalling.  Micheal Roth reported that he can usually communicate in a healthy manner with supports, but some people can trigger him such as his partner and a coworker with their harsh, judgmental comments.  Micheal Roth stated "If you yell at me, I'm going to yell back.  Some people know how to push my buttons".  Micheal Roth expressed receptiveness to alternative strategies offered in order to improve conflict resolution skills, including practicing coping skills learned from therapy programs in order to better manage his mood when triggered, practicing "I" statements in order to better express thoughts and feelings, and taking responsibility for the role he may play in any conflict that escalates.      Third Therapeutic Activity: Counselor invited group members to practice a new meditation exercise to add to self-care routine in order to better manage daily  stress.  Counselor provided instruction on process of getting comfortable, achieving relaxing breathing rhythm, and visualizing a protective light moving across the muscle groups within their bodies to identify and eliminate areas of tension associated with stress over course of 15 minutes practice.  Counselor inquired about how members mentally and physically felt afterward, any challenges they faced in concentration, and whether they would be motivated to practice this in free time to improve coping ability.  Intervention was effective, as evidenced by Micheal Roth Roth actively engaging in exercise, and reporting that it was effective in calming his mind and body, stating "I did find it hard to focus at first because of a phone call, but the light really helped".  He reported that he noticed reduced tension in his shoulders, legs, and feet, and expressed interest in practicing this regularly as part of his self-care routine.  He stated "Its like a weight was lifted off of me".     Assessment and Plan: Counselor recommends that Micheal Roth remain in IOP treatment to better manage mental health symptoms, ensure stability and pursue completion of treatment plan goals. Counselor recommends adherence to crisis/safety plan, taking medications as prescribed, and following up with medical professionals if any issues arise.    Follow Up Instructions: Counselor will send Webex link for session tomorrow.  Micheal Roth was advised to call back or seek an in-person evaluation if the symptoms worsen or if the condition fails to improve as anticipated.   Collaboration of Care:   Medication Management AEB Dr. Alfonse Flavors or Hillery Jacks, NP                                          Case Manager AEB Jeri Modena, CNA    Patient/Guardian was advised Release of Information must be obtained prior to any record release in order to collaborate their care with an outside provider. Patient/Guardian was advised if they have not already done  so to contact the registration department to sign all necessary forms in order for [place] to release information regarding their care.    Consent: Patient/Guardian gives verbal consent for treatment and assignment of benefits for services provided during this visit. Patient/Guardian expressed understanding and agreed to proceed.   I provided 170 minutes of non-face-to-face time during this encounter.   Noralee Stain, LCSW, LCAS 06/29/23

## 2023-07-02 ENCOUNTER — Other Ambulatory Visit (HOSPITAL_COMMUNITY): Payer: 59 | Admitting: Licensed Clinical Social Worker

## 2023-07-02 DIAGNOSIS — F332 Major depressive disorder, recurrent severe without psychotic features: Secondary | ICD-10-CM

## 2023-07-02 DIAGNOSIS — F411 Generalized anxiety disorder: Secondary | ICD-10-CM

## 2023-07-02 NOTE — Progress Notes (Signed)
Virtual Visit via Video Note   I connected with Brady A. Kuan on 07/02/23 at  9:00 AM EDT by a video enabled telemedicine application and verified that I am speaking with the correct person using two identifiers.   At orientation to the IOP program, Case Manager discussed the limitations of evaluation and management by telemedicine and the availability of in person appointments. The patient expressed understanding and agreed to proceed with virtual visits throughout the duration of the program.   Location:  Patient: Patient Home Provider: Home Office   History of Present Illness: MDD and GAD   Observations/Objective: Check In: Case Manager checked in with all participants to review discharge dates, insurance authorizations, work-related documents and needs from the treatment team regarding medications. Joshuajames stated needs and engaged in discussion.    Initial Therapeutic Activity: Counselor facilitated a check-in with Maze to assess for safety, sobriety and medication compliance.  Counselor also inquired about Taran's current emotional ratings, as well as any significant changes in thoughts, feelings or behavior since previous check in.  Tilman presented for session on time and was alert, oriented x5, with no evidence or self-report of active SI/HI or A/V H.  Edrees reported compliance with medication and denied use of alcohol or illicit substances.  Dylen reported scores of 6/10 for depression, 6/10 for anxiety, and 6/10 for anger/irritability.  Anjuan denied any recent outbursts or panic attacks.  Lc reported that a recent success was getting out over the weekend to see a concert with his son.  Sacha reported that a struggle was having issues with his girlfriend yesterday when he discovered that she has been monitoring his phone.  Mase reported that his goal today is to get outside of the house with his mother to clear his head and  make a decision about what to do with his relationship.     Second Therapeutic Activity: Counselor introduced topic of self-care today.  Counselor explained how this can be defined as the things one does to maintain good health and improve well-being.  Counselor provided members with a self-care assessment form to complete.  This handout featured various sub-categories of self-care, including physical, psychological/emotional, social, spiritual, and professional.  Members were asked to rank their engagement in the activities listed for each dimension on a scale of 1-3, with 1 indicating 'Poor', 2 indicating 'Ok', and 3 indicating 'Well'.  Counselor invited members to share results of their assessment, and inquired about which areas of self-care they are doing well in, as well as areas that require attention, and how they plan to begin addressing this during treatment.  Intervention was effective, as evidenced by [name] successfully completing initial 2 sections of assessment and actively engaging in discussion on subject, reporting that he is excelling in areas such as taking care of personal hygiene, eating regularly, and doing comforting things, but would benefit from focusing more on areas such as exercising regularly, participating in fun activities, expressing feelings in a healthy way, learning new things unrelated to work, and recognizing strengths and achievements.  Callon reported that he would work to improve self-care deficits by finding new hobbies that aren't too physically taxing which can provide stress relief and exercise, ensuring that he has supportive people in his network that he can express his thoughts and feelings with in a positive manner, learning new hobbies like blacksmithing to include in his routine outside of work, giving himself more credit for recent accomplishments such as completing PHP and prioritizing his mental and physical  health needs more.    Assessment and  Plan: Counselor recommends that Emersyn remain in IOP treatment to better manage mental health symptoms, ensure stability and pursue completion of treatment plan goals. Counselor recommends adherence to crisis/safety plan, taking medications as prescribed, and following up with medical professionals if any issues arise.    Follow Up Instructions: Counselor will send Webex link for session tomorrow.  Kule was advised to call back or seek an in-person evaluation if the symptoms worsen or if the condition fails to improve as anticipated.   Collaboration of Care:   Medication Management AEB Dr. Alfonse Flavors or Hillery Jacks, NP                                          Case Manager AEB Jeri Modena, CNA    Patient/Guardian was advised Release of Information must be obtained prior to any record release in order to collaborate their care with an outside provider. Patient/Guardian was advised if they have not already done so to contact the registration department to sign all necessary forms in order for [place] to release information regarding their care.    Consent: Patient/Guardian gives verbal consent for treatment and assignment of benefits for services provided during this visit. Patient/Guardian expressed understanding and agreed to proceed.   I provided 180 minutes of non-face-to-face time during this encounter.   Noralee Stain, LCSW, LCAS 07/02/23

## 2023-07-03 ENCOUNTER — Other Ambulatory Visit (HOSPITAL_COMMUNITY): Payer: 59 | Admitting: Licensed Clinical Social Worker

## 2023-07-03 DIAGNOSIS — F332 Major depressive disorder, recurrent severe without psychotic features: Secondary | ICD-10-CM

## 2023-07-03 DIAGNOSIS — F411 Generalized anxiety disorder: Secondary | ICD-10-CM

## 2023-07-04 ENCOUNTER — Other Ambulatory Visit (HOSPITAL_COMMUNITY): Payer: 59 | Admitting: Licensed Clinical Social Worker

## 2023-07-04 DIAGNOSIS — F332 Major depressive disorder, recurrent severe without psychotic features: Secondary | ICD-10-CM

## 2023-07-04 DIAGNOSIS — F411 Generalized anxiety disorder: Secondary | ICD-10-CM

## 2023-07-04 NOTE — Psych (Signed)
Virtual Visit via Video Note  I connected with Micheal Roth on 06/11/23 at  9:00 AM EDT by a video enabled telemedicine application and verified that I am speaking with the correct person using two identifiers.  Location: Patient: patient home Provider: clinical home office   I discussed the limitations of evaluation and management by telemedicine and the availability of in person appointments. The patient expressed understanding and agreed to proceed.  I discussed the assessment and treatment plan with the patient. The patient was provided an opportunity to ask questions and all were answered. The patient agreed with the plan and demonstrated an understanding of the instructions.   The patient was advised to call back or seek an in-person evaluation if the symptoms worsen or if the condition fails to improve as anticipated.  Pt was provided 240 minutes of non-face-to-face time during this encounter.   Donia Guiles, LCSW   [facility] PHP THERAPIST PROGRESS NOTE  Micheal Roth [id number]  Session Time: 9:00 - 10:00  Participation Level: Active  Behavioral Response: CasualAlertAnxious  Type of Therapy: Group Therapy  Treatment Goals addressed: Coping  Progress Towards Goals: Progressing  Interventions: CBT, DBT, Supportive, and Reframing  Summary: Micheal Roth is a 47 y.o. male who presents with depression and anxiety symptoms.  Clinician led check-in regarding current stressors and situation, and review of patient completed daily inventory. Clinician utilized active listening and empathetic response and validated patient emotions. Clinician facilitated processing group on pertinent issues.?    Therapist Response: Patient arrived within time allowed. Patient rates his mood at a 5.5 on a scale of 1-10 with 10 being best. Pt states he feels "okay." Pt states he slept 5.5 hours and ate 2x yesterday. Pt reports he spent time with a friend and his girlfriend over  the weekend and had a good time. Pt reports enjoying having a car over the weekend and ran errands and explored. Patient able to process. Patient engaged in discussion.      Session Time: 10:00 am - 11:00 am   Participation Level: Active   Behavioral Response: CasualAlertAnxious and Depressed   Type of Therapy: Group Therapy   Treatment Goals addressed: Coping   Progress Towards Goals: Progressing   Interventions: CBT, DBT, Solution Focused, Strength-based, Supportive, and Reframing   Therapist Response: Cln led processing group for pt's current struggles. Group members shared stressors and provided support and feedback. Cln brought in topics of boundaries, healthy relationships, and unhealthy thought processes to inform discussion.    Therapist Response:  Pt able to process and provide support to group.         Session Time: 11:00 -12:00   Participation Level: Active   Behavioral Response: CasualAlertDepressed   Type of Therapy: Group Therapy   Treatment Goals addressed: Coping   Progress Towards Goals: Progressing   Interventions: CBT, DBT, Solution Focused, Strength-based, Supportive, and Reframing   Summary: Cln led discussion on coping with loneliness. Group members shared common reactions to loneliness and how it helps/hinders them. Cln contextualized loneliness and encouraged pt's to manage loneliness as they would any other feeling. Group brainstormed ways to manage.    Therapist Response:  Pt engaged in discussion and reports increased insight.       Session Time: 12:00 -1:00   Participation Level: Active   Behavioral Response: CasualAlertDepressed   Type of Therapy: Group therapy   Treatment Goals addressed: Coping   Progress Towards Goals: Progressing   Interventions: OT group   Summary: 12:00 -  12:50: Occupational Therapy group with cln E. Hollan.  12:50 - 1:00 Clinician assessed for immediate needs, medication compliance and efficacy, and  safety concerns.   Therapist Response: 12:00 - 12:50: See note 12:50 - 1:00 pm: At check-out, patient reports no immediate concerns. Patient demonstrates progress as evidenced by continued engagement and responsiveness to treatment. Patient denies SI/HI/self-harm thoughts at the end of group.     Suicidal/Homicidal: Nowithout intent/plan  Plan: Pt will continue in PHP while working to decrease depression and anxiety symptoms, increase daily functioning, and in crease ability to manage symptoms in a healthy manner.   Collaboration of Care: Medication Management AEB T Lewis  Patient/Guardian was advised Release of Information must be obtained prior to any record release in order to collaborate their care with an outside provider. Patient/Guardian was advised if they have not already done so to contact the registration department to sign all necessary forms in order for [place] to release information regarding their care.   Consent: Patient/Guardian gives verbal consent for treatment and assignment of benefits for services provided during this visit. Patient/Guardian expressed understanding and agreed to proceed.   Diagnosis: Severe episode of recurrent major depressive disorder, without psychotic features (HCC) [F33.2]    1. Severe episode of recurrent major depressive disorder, without psychotic features (HCC)   2. GAD (generalized anxiety disorder)       Donia Guiles, LCSW

## 2023-07-04 NOTE — Progress Notes (Signed)
Virtual Visit via Video Note   I connected with Micheal Roth on 07/04/23 at  9:00 AM EDT by a video enabled telemedicine application and verified that I am speaking with the correct person using two identifiers.   At orientation to the IOP program, Case Manager discussed the limitations of evaluation and management by telemedicine and the availability of in person appointments. The patient expressed understanding and agreed to proceed with virtual visits throughout the duration of the program.   Location:  Patient: Patient Home Provider: Home Office   History of Present Illness: MDD and GAD   Observations/Objective: Check In: Case Manager checked in with all participants to review discharge dates, insurance authorizations, work-related documents and needs from the treatment team regarding medications. Micheal Roth stated needs and engaged in discussion.    Initial Therapeutic Activity: Counselor facilitated a check-in with Micheal Roth to assess for safety, sobriety and medication compliance.  Counselor also inquired about Micheal Roth's current emotional ratings, as well as any significant changes in thoughts, feelings or behavior since previous check in.  Micheal Roth presented for session on time and was alert, oriented x5, with no evidence or self-report of active SI/HI or A/V H.  Micheal Roth reported compliance with medication and denied use of alcohol or illicit substances.  Micheal Roth reported scores of 5/10 for depression, 5/10 for anxiety, and 5/10 for anger/irritability.  Micheal Roth denied any recent outbursts or panic attacks.  Micheal Roth reported that a recent success was voting yesterday and helping his niece with transportation.  Micheal Roth reported that a struggle has been maintaining boundaries with his ex, stating "I'm done with letting someone dictate my life".  Micheal Roth reported that his goal today is to watch some scary movies for self-care to celebrate the Halloween  holiday.          Second Therapeutic Activity: Counselor covered topic of core beliefs with group today.  Counselor virtually shared a handout on the subject, which explained how everyone looks at the world differently, and two people can have the same experience, but have different interpretations of what happened.  Members were encouraged to think of these like sunglasses with different "shades" influencing perception towards positive or negative outcomes.  Examples of negative core beliefs were provided, such as "I'm unlovable", "I'm not good enough", and "I'm a bad person".  Members were asked to share which one(s) they could relate to, and then identify evidence which contradicts these beliefs.  Counselor also provided psychoeducation on positive affirmations today.  Counselor explained how these are positive statements which can be spoken out loud or recited mentally to challenge negative thoughts and/or core beliefs to improve mood and outlook each day.  Counselor shared a comprehensive list of affirmations virtually to members with different categories, including ones for health, confidence, success, and happiness.  Counselor invited members to look through this list and identify any which resonated with them, and practice saying them out loud with sincerity.  Intervention was effective, as evidenced by Micheal Roth successfully participating in discussion on the subject and reporting that he could relate to several negative core beliefs listed on the handout, such as "Nothing ever goes right", "I am bad", "I am trapped", "I am worthless", and "People can't be trusted".  Micheal Roth was able to successfully challenge the core belief "I am worthless" by listing evidence that contradicted it, reporting that he tries to be available to his family, and he has a meaningful job that benefits the community.  Micheal Roth also reported that he liked several of the  positive affirmations listed, such as "I get better  every day in every way", "This too shall pass", "I am open to all possibilities", and "Failure is great feedback".      Assessment and Plan: Counselor recommends that Micheal Roth remain in IOP treatment to better manage mental health symptoms, ensure stability and pursue completion of treatment plan goals. Counselor recommends adherence to crisis/safety plan, taking medications as prescribed, and following up with medical professionals if any issues arise.    Follow Up Instructions: Counselor will send Webex link for session tomorrow.  Micheal Roth was advised to call back or seek an in-person evaluation if the symptoms worsen or if the condition fails to improve as anticipated.   Collaboration of Care:   Medication Management AEB Dr. Alfonse Flavors or Hillery Jacks, NP                                          Case Manager AEB Jeri Modena, CNA    Patient/Guardian was advised Release of Information must be obtained prior to any record release in order to collaborate their care with an outside provider. Patient/Guardian was advised if they have not already done so to contact the registration department to sign all necessary forms in order for [place] to release information regarding their care.    Consent: Patient/Guardian gives verbal consent for treatment and assignment of benefits for services provided during this visit. Patient/Guardian expressed understanding and agreed to proceed.   I provided 180 minutes of non-face-to-face time during this encounter.   Noralee Stain, LCSW, LCAS 07/04/23

## 2023-07-04 NOTE — Psych (Signed)
Virtual Visit via Video Note  I connected with Micheal Roth on 06/13/23 at  9:00 AM EDT by a video enabled telemedicine application and verified that I am speaking with the correct person using two identifiers.  Location: Patient: patient home Provider: clinical home office   I discussed the limitations of evaluation and management by telemedicine and the availability of in person appointments. The patient expressed understanding and agreed to proceed.  I discussed the assessment and treatment plan with the patient. The patient was provided an opportunity to ask questions and all were answered. The patient agreed with the plan and demonstrated an understanding of the instructions.   The patient was advised to call back or seek an in-person evaluation if the symptoms worsen or if the condition fails to improve as anticipated.  Pt was provided 240 minutes of non-face-to-face time during this encounter.   Donia Guiles, LCSW   [facility] BH PHP THERAPIST PROGRESS NOTE  Micheal Roth [id number]  Session Time: 9:00 - 10:00  Participation Level: Active  Behavioral Response: CasualAlertAnxious  Type of Therapy: Group Therapy  Treatment Goals addressed: Coping  Progress Towards Goals: Progressing  Interventions: CBT, DBT, Supportive, and Reframing  Summary: Micheal Roth is a 47 y.o. male who presents with depression and anxiety symptoms.  Clinician led check-in regarding current stressors and situation, and review of patient completed daily inventory. Clinician utilized active listening and empathetic response and validated patient emotions. Clinician facilitated processing group on pertinent issues.?    Therapist Response: Patient arrived within time allowed. Patient rates his mood at a 5.5 on a scale of 1-10 with 10 being best. Pt states he feels "okay." Pt states he slept 6 hours and ate 1x yesterday. Pt reports he watched Halloween movies yesterday and saw a friend.  Pt reports he is thinking about getting back into hobbies and was pleased that he felt like doing them as he has not desired to in a long while. Pt reports noticing his mood improves in group and thinks being around supportive people is more important than he used to think.  Patient able to process. Patient engaged in discussion.      Session Time: 10:00 am - 11:00 am   Participation Level: Active   Behavioral Response: CasualAlertAnxious and Depressed   Type of Therapy: Group Therapy   Treatment Goals addressed: Coping   Progress Towards Goals: Progressing   Interventions: CBT, DBT, Solution Focused, Strength-based, Supportive, and Reframing   Therapist Response: Cln led processing group for pt's current struggles. Group members shared stressors and provided support and feedback. Cln brought in topics of boundaries, healthy relationships, and unhealthy thought processes to inform discussion.    Therapist Response:  Pt able to process and provide support to group.          Session Time: 11:00 -12:00   Participation Level: Active   Behavioral Response: CasualAlertDepressed   Type of Therapy: Group Therapy   Treatment Goals addressed: Coping   Progress Towards Goals: Progressing   Interventions: Strength-based, Supportive, and Reframing   Summary: Chaplaincy group with K. Claussen   Therapist Response: Pt participated and engaged in discussion.           Session Time: 12:00 -1:00   Participation Level: Active   Behavioral Response: CasualAlertDepressed   Type of Therapy: Group therapy   Treatment Goals addressed: Coping   Progress Towards Goals: Progressing   Interventions: OT group   Summary: 12:00 - 12:50: Occupational Therapy group with cln  E. Hollan.  12:50 - 1:00 Clinician assessed for immediate needs, medication compliance and efficacy, and safety concerns.   Therapist Response: 12:00 - 12:50: See note 12:50 - 1:00 pm: At check-out, patient  reports no immediate concerns. Patient demonstrates progress as evidenced by continued engagement and responsiveness to treatment. Patient denies SI/HI/self-harm thoughts at the end of group.     Suicidal/Homicidal: Nowithout intent/plan  Plan: Pt will continue in PHP while working to decrease depression and anxiety symptoms, increase daily functioning, and in crease ability to manage symptoms in a healthy manner.   Collaboration of Care: Medication Management AEB T Lewis  Patient/Guardian was advised Release of Information must be obtained prior to any record release in order to collaborate their care with an outside provider. Patient/Guardian was advised if they have not already done so to contact the registration department to sign all necessary forms in order for [place] to release information regarding their care.   Consent: Patient/Guardian gives verbal consent for treatment and assignment of benefits for services provided during this visit. Patient/Guardian expressed understanding and agreed to proceed.   Diagnosis: Severe episode of recurrent major depressive disorder, without psychotic features (HCC) [F33.2]    1. Severe episode of recurrent major depressive disorder, without psychotic features (HCC)   2. GAD (generalized anxiety disorder)       Donia Guiles, LCSW

## 2023-07-04 NOTE — Psych (Signed)
Virtual Visit via Video Note  I connected with Micheal Roth on 06/05/23 at  9:00 AM EDT by a video enabled telemedicine application and verified that I am speaking with the correct person using two identifiers.  Location: Patient: patient home Provider: clinical home office   I discussed the limitations of evaluation and management by telemedicine and the availability of in person appointments. The patient expressed understanding and agreed to proceed.  I discussed the assessment and treatment plan with the patient. The patient was provided an opportunity to ask questions and all were answered. The patient agreed with the plan and demonstrated an understanding of the instructions.   The patient was advised to call back or seek an in-person evaluation if the symptoms worsen or if the condition fails to improve as anticipated.  Pt was provided 240 minutes of non-face-to-face time during this encounter.   Donia Guiles, LCSW   [facility] BH PHP THERAPIST PROGRESS NOTE  Micheal Roth [id number]  Session Time: 9:00 - 10:00  Participation Level: Active  Behavioral Response: CasualAlertAnxious  Type of Therapy: Group Therapy  Treatment Goals addressed: Coping  Progress Towards Goals: Progressing  Interventions: CBT, DBT, Supportive, and Reframing  Summary: Micheal Roth is a 47 y.o. male who presents with depression and anxiety symptoms.  Clinician led check-in regarding current stressors and situation, and review of patient completed daily inventory. Clinician utilized active listening and empathetic response and validated patient emotions. Clinician facilitated processing group on pertinent issues.?    Therapist Response: Patient arrived within time allowed. Patient rates his mood at a 3 on a scale of 1-10 with 10 being best. Pt states he feels "the same." Pt states he slept 5 broken hours and ate 2x yesterday. Pt reports he continues to have increased pain due to the  weather and it is impacting his mood negatively. Pt states he has taken OTC pain medication and is resting. Pt states he talked to 3 support system members throughout the day which was helpful to him "somewhat." Pt struggles with not being able to leave the house when he wants to due to transportation issues. Patient able to process. Patient engaged in discussion.      Session Time: 10:00 am - 11:00 am   Participation Level: Active   Behavioral Response: CasualAlertAnxious and Depressed   Type of Therapy: Group Therapy   Treatment Goals addressed: Coping   Progress Towards Goals: Progressing   Interventions: CBT, DBT, Solution Focused, Strength-based, Supportive, and Reframing   Therapist Response: Cln led processing group for pt's current struggles. Group members shared stressors and provided support and feedback. Cln brought in topics of boundaries, healthy relationships, and unhealthy thought processes to inform discussion.    Therapist Response:  Pt able to process and provide support to group.          Session Time: 11:00 -12:00   Participation Level: Active   Behavioral Response: CasualAlertDepressed   Type of Therapy: Group Therapy   Treatment Goals addressed: Coping   Progress Towards Goals: Progressing   Interventions: CBT, DBT, Solution Focused, Strength-based, Supportive, and Reframing   Summary: Cln led discussion on DBT dialectics and balance. Cln encouraged pt's to utilize AND statements to cue their brain to hold two opposing ideas. Cln provided examples and group members created their own AND statements.   Therapist Response: Pt engaged in discussion and created AND statement around recognzing progress.           Session Time: 12:00 -1:00  Participation Level: Active   Behavioral Response: CasualAlertDepressed   Type of Therapy: Group therapy   Treatment Goals addressed: Coping   Progress Towards Goals: Progressing   Interventions: OT  group   Summary: 12:00 - 12:50: Occupational Therapy group with cln E. Hollan.  12:50 - 1:00 Clinician assessed for immediate needs, medication compliance and efficacy, and safety concerns.   Therapist Response: 12:00 - 12:50: See note 12:50 - 1:00 pm: At check-out, patient reports no immediate concerns. Patient demonstrates progress as evidenced by continued engagement and responsiveness to treatment. Patient denies SI/HI/self-harm thoughts at the end of group.     Suicidal/Homicidal: Nowithout intent/plan  Plan: Pt will continue in PHP while working to decrease depression and anxiety symptoms, increase daily functioning, and in crease ability to manage symptoms in a healthy manner.   Collaboration of Care: Medication Management AEB T Lewis  Patient/Guardian was advised Release of Information must be obtained prior to any record release in order to collaborate their care with an outside provider. Patient/Guardian was advised if they have not already done so to contact the registration department to sign all necessary forms in order for [place] to release information regarding their care.   Consent: Patient/Guardian gives verbal consent for treatment and assignment of benefits for services provided during this visit. Patient/Guardian expressed understanding and agreed to proceed.   Diagnosis: Severe episode of recurrent major depressive disorder, without psychotic features (HCC) [F33.2]    1. Severe episode of recurrent major depressive disorder, without psychotic features (HCC)   2. GAD (generalized anxiety disorder)       Donia Guiles, LCSW

## 2023-07-04 NOTE — Psych (Signed)
Virtual Visit via Video Note  I connected with Micheal Roth on 06/12/23 at  9:00 AM EDT by a video enabled telemedicine application and verified that I am speaking with the correct person using two identifiers.  Location: Patient: patient home Provider: clinical home office   I discussed the limitations of evaluation and management by telemedicine and the availability of in person appointments. The patient expressed understanding and agreed to proceed.  I discussed the assessment and treatment plan with the patient. The patient was provided an opportunity to ask questions and all were answered. The patient agreed with the plan and demonstrated an understanding of the instructions.   The patient was advised to call back or seek an in-person evaluation if the symptoms worsen or if the condition fails to improve as anticipated.  Pt was provided 240 minutes of non-face-to-face time during this encounter.   Donia Guiles, LCSW   [facility] BH PHP THERAPIST PROGRESS NOTE  Micheal Roth [id number]  Session Time: 9:00 - 10:00  Participation Level: Active  Behavioral Response: CasualAlertAnxious  Type of Therapy: Group Therapy  Treatment Goals addressed: Coping  Progress Towards Goals: Progressing  Interventions: CBT, DBT, Supportive, and Reframing  Summary: Micheal Roth is a 47 y.o. male who presents with depression and anxiety symptoms.  Clinician led check-in regarding current stressors and situation, and review of patient completed daily inventory. Clinician utilized active listening and empathetic response and validated patient emotions. Clinician facilitated processing group on pertinent issues.?    Therapist Response: Patient arrived within time allowed. Patient rates his mood at a 6 on a scale of 1-10 with 10 being best. Pt states he feels "decent." Pt states he slept 6 hours and ate 1x yesterday. Pt reports he had a "easy, restful day" and took a nap, saw a  friend, and watched tv. Pt reports attempting to not feel guilty about down time. Patient able to process. Patient engaged in discussion.      Session Time: 10:00 am - 11:00 am   Participation Level: Active   Behavioral Response: CasualAlertAnxious and Depressed   Type of Therapy: Group Therapy   Treatment Goals addressed: Coping   Progress Towards Goals: Progressing   Interventions: CBT, DBT, Solution Focused, Strength-based, Supportive, and Reframing   Therapist Response: Cln led discussion on rest. Cln discussed the need to rewrite social story of rest being earned or last on the list and assert that rest is productive. Group members discussed barriers to allowing themselves rest and the negative self-talk involved.  Cln worked with group to thought challenge and apply self-coaching strategies.   Therapist Response:  Pt engaged in discussion and reports struggle with allowing themselves rest.         Session Time: 11:00 -12:00   Participation Level: Active   Behavioral Response: CasualAlertDepressed   Type of Therapy: Group Therapy   Treatment Goals addressed: Coping   Progress Towards Goals: Progressing   Interventions: CBT, DBT, Solution Focused, Strength-based, Supportive, and Reframing   Summary: Cln introduced DBT emotion regulation skill, Positive Events. Cln discussed how engaging in positive events both in the moment and over time can shift away from negative outlook. Group discussed ways to apply the skill.   Therapist Response: Pt engaged in discussion and is able to identify ways to utilize positive events skill.       Session Time: 12:00 -1:00   Participation Level: Active   Behavioral Response: CasualAlertDepressed   Type of Therapy: Group therapy   Treatment  Goals addressed: Coping   Progress Towards Goals: Progressing   Interventions: OT group   Summary: 12:00 - 12:50: Occupational Therapy group with cln E. Hollan.  12:50 - 1:00  Clinician assessed for immediate needs, medication compliance and efficacy, and safety concerns.   Therapist Response: 12:00 - 12:50: See note 12:50 - 1:00 pm: At check-out, patient reports no immediate concerns. Patient demonstrates progress as evidenced by continued engagement and responsiveness to treatment. Patient denies SI/HI/self-harm thoughts at the end of group.     Suicidal/Homicidal: Nowithout intent/plan  Plan: Pt will continue in PHP while working to decrease depression and anxiety symptoms, increase daily functioning, and in crease ability to manage symptoms in a healthy manner.   Collaboration of Care: Medication Management AEB T Lewis  Patient/Guardian was advised Release of Information must be obtained prior to any record release in order to collaborate their care with an outside provider. Patient/Guardian was advised if they have not already done so to contact the registration department to sign all necessary forms in order for [place] to release information regarding their care.   Consent: Patient/Guardian gives verbal consent for treatment and assignment of benefits for services provided during this visit. Patient/Guardian expressed understanding and agreed to proceed.   Diagnosis: Severe episode of recurrent major depressive disorder, without psychotic features (HCC) [F33.2]    1. Severe episode of recurrent major depressive disorder, without psychotic features (HCC)   2. GAD (generalized anxiety disorder)       Donia Guiles, LCSW

## 2023-07-04 NOTE — Psych (Signed)
Virtual Visit via Video Note  I connected with Micheal Roth on 06/06/23 at  9:00 AM EDT by a video enabled telemedicine application and verified that I am speaking with the correct person using two identifiers.  Location: Patient: patient home Provider: clinical home office   I discussed the limitations of evaluation and management by telemedicine and the availability of in person appointments. The patient expressed understanding and agreed to proceed.  I discussed the assessment and treatment plan with the patient. The patient was provided an opportunity to ask questions and all were answered. The patient agreed with the plan and demonstrated an understanding of the instructions.   The patient was advised to call back or seek an in-person evaluation if the symptoms worsen or if the condition fails to improve as anticipated.  Pt was provided 240 minutes of non-face-to-face time during this encounter.   Donia Guiles, LCSW   [facility] BH PHP THERAPIST PROGRESS NOTE  Micheal Roth [id number]  Session Time: 9:00 - 10:00  Participation Level: Active  Behavioral Response: CasualAlertAnxious  Type of Therapy: Group Therapy  Treatment Goals addressed: Coping  Progress Towards Goals: Progressing  Interventions: CBT, DBT, Supportive, and Reframing  Summary: Micheal Roth is a 47 y.o. male who presents with depression and anxiety symptoms.  Clinician led check-in regarding current stressors and situation, and review of patient completed daily inventory. Clinician utilized active listening and empathetic response and validated patient emotions. Clinician facilitated processing group on pertinent issues.?    Therapist Response: Patient arrived within time allowed. Patient rates his mood at a 4 on a scale of 1-10 with 10 being best. Pt states he feels "anxious." Pt states he slept 4.5 hours and ate 2x yesterday. Pt reports some coworkers showed up at his house this morning  with a few minutes warning and it has left him unsettled. Pt shares they came by to pick up keys, but it was jarring and unexpected and he is still feeling emotionally elevated. Pt states it has risen high anxiety level in general re: returning to work. Patient able to process. Patient engaged in discussion.      Session Time: 10:00 am - 11:00 am   Participation Level: Active   Behavioral Response: CasualAlertAnxious and Depressed   Type of Therapy: Group Therapy   Treatment Goals addressed: Coping   Progress Towards Goals: Progressing   Interventions: CBT, DBT, Solution Focused, Strength-based, Supportive, and Reframing   Therapist Response: Cln led processing group for pt's current struggles. Group members shared stressors and provided support and feedback. Cln brought in topics of boundaries, healthy relationships, and unhealthy thought processes to inform discussion.    Therapist Response:  Pt able to process and provide support to group.          Session Time: 11:00 -12:00   Participation Level: Active   Behavioral Response: CasualAlertDepressed   Type of Therapy: Group Therapy   Treatment Goals addressed: Coping   Progress Towards Goals: Progressing   Interventions: Strength-based, Supportive, and Reframing   Summary: Chaplaincy group with K. Claussen   Therapist Response: Pt participated and engaged in discussion.           Session Time: 12:00 -1:00   Participation Level: Active   Behavioral Response: CasualAlertDepressed   Type of Therapy: Group therapy   Treatment Goals addressed: Coping   Progress Towards Goals: Progressing   Interventions: OT group   Summary: 12:00 - 12:50: Occupational Therapy group with cln E. Hollan.  12:50 -  1:00 Clinician assessed for immediate needs, medication compliance and efficacy, and safety concerns.   Therapist Response: 12:00 - 12:50: See note 12:50 - 1:00 pm: At check-out, patient reports no immediate  concerns. Patient demonstrates progress as evidenced by continued engagement and responsiveness to treatment. Patient denies SI/HI/self-harm thoughts at the end of group.     Suicidal/Homicidal: Nowithout intent/plan  Plan: Pt will continue in PHP while working to decrease depression and anxiety symptoms, increase daily functioning, and in crease ability to manage symptoms in a healthy manner.   Collaboration of Care: Medication Management AEB T Lewis  Patient/Guardian was advised Release of Information must be obtained prior to any record release in order to collaborate their care with an outside provider. Patient/Guardian was advised if they have not already done so to contact the registration department to sign all necessary forms in order for [place] to release information regarding their care.   Consent: Patient/Guardian gives verbal consent for treatment and assignment of benefits for services provided during this visit. Patient/Guardian expressed understanding and agreed to proceed.   Diagnosis: Severe episode of recurrent major depressive disorder, without psychotic features (HCC) [F33.2]    1. Severe episode of recurrent major depressive disorder, without psychotic features (HCC)   2. GAD (generalized anxiety disorder)       Donia Guiles, LCSW

## 2023-07-04 NOTE — Psych (Signed)
Virtual Visit via Video Note  I connected with Micheal Roth on 06/08/23 at  9:00 AM EDT by a video enabled telemedicine application and verified that I am speaking with the correct person using two identifiers.  Location: Patient: patient home Provider: clinical home office   I discussed the limitations of evaluation and management by telemedicine and the availability of in person appointments. The patient expressed understanding and agreed to proceed.  I discussed the assessment and treatment plan with the patient. The patient was provided an opportunity to ask questions and all were answered. The patient agreed with the plan and demonstrated an understanding of the instructions.   The patient was advised to call back or seek an in-person evaluation if the symptoms worsen or if the condition fails to improve as anticipated.  Micheal Roth was provided 240 minutes of non-face-to-face time during this encounter.   Donia Guiles, LCSW   [facility] PHP THERAPIST PROGRESS NOTE  Micheal Roth [id number]  Session Time: 9:00 - 10:00  Participation Level: Active  Behavioral Response: CasualAlertAnxious  Type of Therapy: Group Therapy  Treatment Goals addressed: Coping  Progress Towards Goals: Progressing  Interventions: CBT, DBT, Supportive, and Reframing  Summary: Micheal Roth is a 47 y.o. male who presents with depression and anxiety symptoms.  Clinician led check-in regarding current stressors and situation, and review of patient completed daily inventory. Clinician utilized active listening and empathetic response and validated patient emotions. Clinician facilitated processing group on pertinent issues.?    Therapist Response: Patient arrived within time allowed. Patient rates his mood at a 6.5 on a scale of 1-10 with 10 being best. Micheal Roth states he feels "my mood lifting." Micheal Roth states he slept 8 hours and ate 2x yesterday. Micheal Roth reports he had a PCP appointment yesterday and spent  time with his mom and child. Micheal Roth reports increased anxiety due to conflict with his girlfriend and trying not to let it "drag me down."  Patient able to process. Patient engaged in discussion.      Session Time: 10:00 am - 11:00 am   Participation Level: Active   Behavioral Response: CasualAlertAnxious and Depressed   Type of Therapy: Group Therapy   Treatment Goals addressed: Coping   Progress Towards Goals: Progressing   Interventions: CBT, DBT, Solution Focused, Strength-based, Supportive, and Reframing   Therapist Response: Cln led discussion on ways to manage stressors and feelings over the weekend. Group members  brainstormed things to do over the weekend for multiple levels of energy, access, and moods. Cln reviewed crisis services should they be needed and provided Micheal Roth's with the text crisis line, mobile crisis, national suicide hotline, [facility] 24/7 line, and information on [facility].      Therapist Response: Micheal Roth engaged in discussion and is able to identify 3 ideas of what to do over the weekend to keep their mind engaged.         Session Time: 11:00 -12:00   Participation Level: Active   Behavioral Response: CasualAlertDepressed   Type of Therapy: Group Therapy   Treatment Goals addressed: Coping   Progress Towards Goals: Progressing   Interventions: CBT, DBT, Solution Focused, Strength-based, Supportive, and Reframing   Summary: Cln continued topic of DBT distress tolerance skills and the ACCEPTS distraction skill. Group reviewed P-S skills and discussed how they can practice them in their every day life.    Therapist Response: Micheal Roth engaged in discussion and determines ways to practice each skill.  Session Time: 12:00 -1:00   Participation Level: Active   Behavioral Response: CasualAlertDepressed   Type of Therapy: Group therapy   Treatment Goals addressed: Coping   Progress Towards Goals: Progressing   Interventions: OT group   Summary:  12:00 - 12:50: Occupational Therapy group with cln E. Hollan.  12:50 - 1:00 Clinician assessed for immediate needs, medication compliance and efficacy, and safety concerns.   Therapist Response: 12:00 - 12:50: See note 12:50 - 1:00 pm: At check-out, patient reports no immediate concerns. Patient demonstrates progress as evidenced by continued engagement and responsiveness to treatment. Patient denies SI/HI/self-harm thoughts at the end of group.     Suicidal/Homicidal: Nowithout intent/plan  Plan: Micheal Roth will continue in PHP while working to decrease depression and anxiety symptoms, increase daily functioning, and in crease ability to manage symptoms in a healthy manner.   Collaboration of Care: Medication Management AEB T Lewis  Patient/Guardian was advised Release of Information must be obtained prior to any record release in order to collaborate their care with an outside provider. Patient/Guardian was advised if they have not already done so to contact the registration department to sign all necessary forms in order for [place] to release information regarding their care.   Consent: Patient/Guardian gives verbal consent for treatment and assignment of benefits for services provided during this visit. Patient/Guardian expressed understanding and agreed to proceed.   Diagnosis: Severe episode of recurrent major depressive disorder, without psychotic features (HCC) [F33.2]    1. Severe episode of recurrent major depressive disorder, without psychotic features (HCC)   2. GAD (generalized anxiety disorder)       Donia Guiles, LCSW

## 2023-07-05 ENCOUNTER — Other Ambulatory Visit (HOSPITAL_COMMUNITY): Payer: 59 | Admitting: Licensed Clinical Social Worker

## 2023-07-05 DIAGNOSIS — F332 Major depressive disorder, recurrent severe without psychotic features: Secondary | ICD-10-CM | POA: Diagnosis not present

## 2023-07-05 DIAGNOSIS — F411 Generalized anxiety disorder: Secondary | ICD-10-CM

## 2023-07-05 NOTE — Psych (Signed)
Virtual Visit via Video Note  I connected with Micheal Roth on 06/15/23 at  9:00 AM EDT by a video enabled telemedicine application and verified that I am speaking with the correct person using two identifiers.  Location: Patient: patient home Provider: clinical home office   I discussed the limitations of evaluation and management by telemedicine and the availability of in person appointments. The patient expressed understanding and agreed to proceed.  I discussed the assessment and treatment plan with the patient. The patient was provided an opportunity to ask questions and all were answered. The patient agreed with the plan and demonstrated an understanding of the instructions.   The patient was advised to call back or seek an in-person evaluation if the symptoms worsen or if the condition fails to improve as anticipated.  Pt was provided 240 minutes of non-face-to-face time during this encounter.   Donia Guiles, LCSW   [facility] PHP THERAPIST PROGRESS NOTE  Micheal Roth [id number]  Session Time: 9:00 - 10:00  Participation Level: Active  Behavioral Response: CasualAlertAnxious  Type of Therapy: Group Therapy  Treatment Goals addressed: Coping  Progress Towards Goals: Progressing  Interventions: CBT, DBT, Supportive, and Reframing  Summary: Micheal Roth is a 47 y.o. male who presents with depression and anxiety symptoms.  Clinician led check-in regarding current stressors and situation, and review of patient completed daily inventory. Clinician utilized active listening and empathetic response and validated patient emotions. Clinician facilitated processing group on pertinent issues.?    Therapist Response: Patient arrived within time allowed. Patient rates his mood at a 8 on a scale of 1-10 with 10 being best. Pt states he feels "good." Pt states he slept 8 hours and ate 2x yesterday. Pt reports he is pet sitting this weekend and has his mom's car, so  is feeling optimistic about having more freedom than normal. Pt shares feeling he is able to respond to his feelings in a healthier manner and is thinking more clearly.  Patient able to process. Patient engaged in discussion.      Session Time: 10:00 am - 11:00 am   Participation Level: Active   Behavioral Response: CasualAlertAnxious and Depressed   Type of Therapy: Group Therapy   Treatment Goals addressed: Coping   Progress Towards Goals: Progressing   Interventions: CBT, DBT, Solution Focused, Strength-based, Supportive, and Reframing   Therapist Response: Cln introduced DBT emotion regulation skill, PLEASE. Cln discussed the importance of taking care of our whole body as a way to aid in stabilizing emotions. Group discussed ways they struggle to manage the elements of PLEASE and how to remove barriers.    Therapist Response:  Pt engaged in discussion and is able to identify ways to utilize PLEASE skill.           Session Time: 11:00 -12:00   Participation Level: Active   Behavioral Response: CasualAlertDepressed   Type of Therapy: Group Therapy   Treatment Goals addressed: Coping   Progress Towards Goals: Progressing   Interventions: CBT, DBT, Solution Focused, Strength-based, Supportive, and Reframing   Summary: Cln led discussion on ways to manage stressors and feelings over the weekend. Group members  brainstormed things to do over the weekend for multiple levels of energy, access, and moods. Cln reviewed crisis services should they be needed and provided pt's with the text crisis line, mobile crisis, national suicide hotline, [facility] 24/7 line, and information on [facility].      Therapist Response: Pt engaged in discussion and  is able to identify 3 ideas of what to do over the weekend to keep their mind engaged.       Session Time: 12:00 -1:00   Participation Level: Active   Behavioral Response: CasualAlertDepressed   Type of Therapy: Group therapy    Treatment Goals addressed: Coping   Progress Towards Goals: Progressing   Interventions: OT group   Summary: 12:00 - 12:50: Occupational Therapy group with cln E. Hollan.  12:50 - 1:00 Clinician assessed for immediate needs, medication compliance and efficacy, and safety concerns.   Therapist Response: 12:00 - 12:50: See note 12:50 - 1:00 pm: At check-out, patient reports no immediate concerns. Patient demonstrates progress as evidenced by continued engagement and responsiveness to treatment. Patient denies SI/HI/self-harm thoughts at the end of group.     Suicidal/Homicidal: Nowithout intent/plan  Plan: Pt will discharge from PHP due to meeting treatment goals of decreased depression and anxiety symptoms, increased daily functioning, and increased ability to manage symptoms in a healthy manner. Pt will step down to IOP within this agency beginning 10/15. Pt and provider are aligned with discharge plan. Pt denies SI/HI at time of discharge.   Collaboration of Care: Medication Management AEB T Lewis  Patient/Guardian was advised Release of Information must be obtained prior to any record release in order to collaborate their care with an outside provider. Patient/Guardian was advised if they have not already done so to contact the registration department to sign all necessary forms in order for [place] to release information regarding their care.   Consent: Patient/Guardian gives verbal consent for treatment and assignment of benefits for services provided during this visit. Patient/Guardian expressed understanding and agreed to proceed.   Diagnosis: Severe episode of recurrent major depressive disorder, without psychotic features (HCC) [F33.2]    1. Severe episode of recurrent major depressive disorder, without psychotic features (HCC)   2. GAD (generalized anxiety disorder)       Donia Guiles, LCSW
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
## 2023-07-05 NOTE — Psych (Signed)
Virtual Visit via Video Note  I connected with Micheal Roth on 06/14/23 at  9:00 AM EDT by a video enabled telemedicine application and verified that I am speaking with the correct person using two identifiers.  Location: Patient: patient home Provider: clinical home office   I discussed the limitations of evaluation and management by telemedicine and the availability of in person appointments. The patient expressed understanding and agreed to proceed.  I discussed the assessment and treatment plan with the patient. The patient was provided an opportunity to ask questions and all were answered. The patient agreed with the plan and demonstrated an understanding of the instructions.   The patient was advised to call back or seek an in-person evaluation if the symptoms worsen or if the condition fails to improve as anticipated.  Pt was provided 240 minutes of non-face-to-face time during this encounter.   Donia Guiles, LCSW   [facility] PHP THERAPIST PROGRESS NOTE  Micheal Roth [id number]  Session Time: 9:00 - 10:00  Participation Level: Active  Behavioral Response: CasualAlertAnxious  Type of Therapy: Group Therapy  Treatment Goals addressed: Coping  Progress Towards Goals: Progressing  Interventions: CBT, DBT, Supportive, and Reframing  Summary: Micheal Roth is a 47 y.o. male who presents with depression and anxiety symptoms.  Clinician led check-in regarding current stressors and situation, and review of patient completed daily inventory. Clinician utilized active listening and empathetic response and validated patient emotions. Clinician facilitated processing group on pertinent issues.?    Therapist Response: Patient arrived within time allowed. Patient rates his mood at a 6 on a scale of 1-10 with 10 being best. Pt states he feels "okay." Pt states he slept 7 hours and ate 1x yesterday. Pt reports he is pleased to be sleeping more. Pt shares he had a  "normal" day and watched tv and talked to his friend. Pt reports continued stress and conflict re: long term decisions about his job and relationship. Patient able to process. Patient engaged in discussion.      Session Time: 10:00 am - 11:00 am   Participation Level: Active   Behavioral Response: CasualAlertAnxious and Depressed   Type of Therapy: Group Therapy   Treatment Goals addressed: Coping   Progress Towards Goals: Progressing   Interventions: CBT, DBT, Solution Focused, Strength-based, Supportive, and Reframing   Therapist Response: Cln led discussion on accountability and the balance between taking responsibility and not beating ourselves up. Group members shared current consequences they are dealing with and how they are processing them. Cln encouraged pt's to utilize the DBT dialectics when processing.   Therapist Response: Pt engaged in discussion and is able to process.       Session Time: 11:00 -12:00   Participation Level: Active   Behavioral Response: CasualAlertDepressed   Type of Therapy: Group Therapy   Treatment Goals addressed: Coping   Progress Towards Goals: Progressing   Interventions: CBT, DBT, Solution Focused, Strength-based, Supportive, and Reframing   Summary: Cln led discussion on personal standards and they way in which it impacts the way we view ourselves and our abilities. Group members discussed judgment, struggles, and barriers they experience in terms of personal standards. Cln brought in topics of balance, grace, and kindness. Cln proposed the "best friend test" as a way to calibrate whether we are viewing our situation with kindness or harshness.    Therapist Response: Pt engaged in discussion and reports willingness to utilize the best friend test.  Session Time: 12:00 -1:00   Participation Level: Active   Behavioral Response: CasualAlertDepressed   Type of Therapy: Group therapy   Treatment Goals addressed:  Coping   Progress Towards Goals: Progressing   Interventions: CBT, DBT, Solution Focused, Strength-based, Supportive, and Reframing   Summary: 12:00 - 12:50: Cln led discussion on unknowns and the way they impact our anxiety. Cln discussed cognitive restructuring, DBT distraction skills, and positive mantras/prayer as ways to address the anxiety. Cln discussed idea "I can do hard things" and ways to bolster confidence in our ability to handle difficult situations.  12:50 - 1:00 Clinician assessed for immediate needs, medication compliance and efficacy, and safety concerns.   Therapist Response: 12:00 - 12:50: Pt shared current unknowns they are dealing with and is able to identify ways to manage.  12:50 - 1:00 pm: At check-out, patient reports no immediate concerns. Patient demonstrates progress as evidenced by continued engagement and responsiveness to treatment. [name]/HI/self-harm thoughts at the end of group.     Suicidal/Homicidal: Nowithout intent/plan  Plan: Pt will continue in PHP while working to decrease depression and anxiety symptoms, increase daily functioning, and in crease ability to manage symptoms in a healthy manner.   Collaboration of Care: Medication Management AEB T Lewis  Patient/Guardian was advised Release of Information must be obtained prior to any record release in order to collaborate their care with an outside provider. Patient/Guardian was advised if they have not already done so to contact the registration department to sign all necessary forms in order for [place] to release information regarding their care.   Consent: Patient/Guardian gives verbal consent for treatment and assignment of benefits for services provided during this visit. Patient/Guardian expressed understanding and agreed to proceed.   Diagnosis: Severe episode of recurrent major depressive disorder, without psychotic features (HCC) [F33.2]    1. Severe episode of recurrent major  depressive disorder, without psychotic features (HCC)   2. GAD (generalized anxiety disorder)       Donia Guiles, LCSW

## 2023-07-06 ENCOUNTER — Other Ambulatory Visit (HOSPITAL_COMMUNITY): Payer: 59 | Attending: Psychiatry | Admitting: Licensed Clinical Social Worker

## 2023-07-06 DIAGNOSIS — F411 Generalized anxiety disorder: Secondary | ICD-10-CM | POA: Diagnosis not present

## 2023-07-06 DIAGNOSIS — F332 Major depressive disorder, recurrent severe without psychotic features: Secondary | ICD-10-CM

## 2023-07-06 NOTE — Progress Notes (Signed)
Virtual Visit via Video Note   I connected with Micheal Roth on 07/06/23 at 9:00am by video enabled telemedicine application and verified that I am speaking with the correct person using two identifiers.   I discussed the limitations, risks, security and privacy concerns of performing an evaluation and management service by video and the availability of in person appointments. I also discussed with the patient that there may be a patient responsible charge related to this service. The patient expressed understanding and agreed to proceed.   I discussed the assessment and treatment plan with the patient. The patient was provided an opportunity to ask questions and all were answered. The patient agreed with the plan and demonstrated an understanding of the instructions.   The patient was advised to call back or seek an in-person evaluation if the symptoms worsen or if the condition fails to improve as anticipated.   I provided 1 hour of non-face-to-face time during this encounter.     Micheal Stain, LCSW, LCAS ____________________________ THERAPIST PROGRESS NOTE   Session Time: 9:00am - 10:00am         Location: Patient: Patient Home Provider: Home Office   Participation Level: Active   Behavioral Response: Alert, casually dressed, euthymic mood/affect   Type of Therapy:  Individual Therapy   Treatment Goals addressed: Anxiety, panic attack, and depression management; Medication management  Progress Towards Goals: Progressing    Interventions: CBT, mental grounding skills    Summary: Micheal Roth is a 47 year old Caucasian male that presented for therapy session today with diagnoses of Major depressive disorder, recurrent, severe; and Generalized Anxiety Disorder.       Suicidal/Homicidal: None; without plan or intent   Therapist Response: Micheal Roth is currently enrolled in [facility], but due to low group census today, Micheal Roth was offered an individual therapy  session instead.  Clinician met with Micheal Roth for virtual therapy appointment today and assessed for safety, sobriety, and medication compliance.  Micheal Roth presented for session on time and was alert, oriented x5, with no evidence or self-report of active SI/HI or A/V H.  Micheal Roth reported ongoing compliance with medication and denied use of alcohol or illicit substances.  Clinician inquired about Micheal Roth's emotional ratings today, as well as any significant changes in thoughts, feelings, or behavior since last check-in.  Micheal Roth reported scores of 2/10 for depression, 2/10 for anxiety, and 0/10 for anger/irritability.  Micheal Roth denied experiencing any panic attacks or outbursts recently.  He reported that a recent struggle was having to help his niece early this morning, who was having a bad day.  Micheal Roth reported that a success was spending more time on self-care activities yesterday, stating "I tried to take it easier".  Micheal Roth reported that his goal this weekend is to hang out with a friend.  Clinician suggested discussion on grounding techniques today with Micheal Roth.  Clinician explained how grounding techniques can be utilized to temporarily distract from distressing thoughts, or feelings, and covered category of mental grounding techniques with him today, including examples for practice such as describing one's environment in detail, playing a categories game, describing an everyday activity in great detail, using one's imagination to visualize a calming image, counting to 10, and more.  Clinician inquired about which one's Micheal Roth found appealing, and could see himself adding to developing coping skillset during treatment.  Intervention was effective, as evidenced by Micheal Roth engaging in discussion on the subject and actively trying some of these techniques out in session.  He expressed interest in practicing  examples such as describing his living room in detail, listing  various music artists as a categories game, visualizing old Micheal Roth cartoons that can make him laugh, reading an engrossing book, watching standup comedy, calling a friend that can lift his spirits, or counting to 10 while focusing on regulating his breathing.  Micheal Roth stated "I'm liking these so far.  I plan on using them". Clinician will continue to monitor.    Plan: Follow up again virtually on 07/09/23.    Diagnosis: Major depressive disorder, recurrent, severe; and Generalized Anxiety Disorder.  Collaboration of Care:   Medication Management AEB Dr. Alfonse Roth or Hillery Jacks, NP                                          Case Manager AEB Jeri Modena, CNA                                                   Patient/Guardian was advised Release of Information must be obtained prior to any record release in order to collaborate their care with an outside provider. Patient/Guardian was advised if they have not already done so to contact the registration department to sign all necessary forms in order for [place] to release information regarding their care.    Consent: Patient/Guardian gives verbal consent for treatment and assignment of benefits for services provided during this visit. Patient/Guardian expressed understanding and agreed to proceed.   Micheal Roth, [place], LCAS 07/06/23
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
## 2023-07-09 ENCOUNTER — Other Ambulatory Visit (HOSPITAL_COMMUNITY): Payer: 59 | Attending: Psychiatry | Admitting: Licensed Clinical Social Worker

## 2023-07-09 ENCOUNTER — Other Ambulatory Visit (HOSPITAL_COMMUNITY): Payer: 59

## 2023-07-09 DIAGNOSIS — F332 Major depressive disorder, recurrent severe without psychotic features: Secondary | ICD-10-CM | POA: Diagnosis not present

## 2023-07-09 DIAGNOSIS — F411 Generalized anxiety disorder: Secondary | ICD-10-CM

## 2023-07-09 NOTE — Progress Notes (Signed)
Virtual Visit via Video Note   I connected with Micheal Roth on 07/09/23 at 9:00am by video enabled telemedicine application and verified that I am speaking with the correct person using two identifiers.   I discussed the limitations, risks, security and privacy concerns of performing an evaluation and management service by video and the availability of in person appointments. I also discussed with the patient that there may be a patient responsible charge related to this service. The patient expressed understanding and agreed to proceed.   I discussed the assessment and treatment plan with the patient. The patient was provided an opportunity to ask questions and all were answered. The patient agreed with the plan and demonstrated an understanding of the instructions.   The patient was advised to call back or seek an in-person evaluation if the symptoms worsen or if the condition fails to improve as anticipated.   I provided 1 hour of non-face-to-face time during this encounter.     Noralee Stain, LCSW, LCAS ____________________________ THERAPIST PROGRESS NOTE   Session Time: 9:00am - 10:00am         Location: Patient: Patient Home Provider: Home Office   Participation Level: Active   Behavioral Response: Alert, casually dressed, euthymic mood/affect   Type of Therapy:  Individual Therapy   Treatment Goals addressed: Anxiety, panic attack, and depression management; Medication management  Progress Towards Goals: Progressing    Interventions: CBT, physical grounding techniques    Summary: Micheal Roth is a 47 year old Caucasian male that presented for therapy session today with diagnoses of Major depressive disorder, recurrent, severe; and Generalized Anxiety Disorder.       Suicidal/Homicidal: None; without plan or intent   Therapist Response: Micheal Roth is currently enrolled in [facility], but due to low group census today, Micheal Roth was offered an individual therapy  appointment instead.  Clinician met with Micheal Roth for virtual therapy session today and assessed for safety, sobriety, and medication compliance.  Micheal Roth presented for session on time and was alert, oriented x5, with no evidence or self-report of active SI/HI or A/V H.  Micheal Roth reported ongoing compliance with medication and denied use of alcohol or illicit substances.  Clinician inquired about Micheal Roth's current emotional ratings, as well as any significant changes in thoughts, feelings, or behavior since previous check-in.  Micheal Roth reported scores of 2/10 for depression, 2/10 for anxiety, and 2/10 for anger/irritability.  Micheal Roth denied experiencing any panic attacks or outbursts recently.  He reported that a recent struggle was dealing with vomiting and nausea Saturday due to something he ate.  He reported that he began feeling better late Sunday, and did not need to see a doctor.  Micheal Roth reported that aside from this, he had no other major issues, and wished to continue talking about new coping skills today.  Clinician continued discussion on topic of grounding skills.  Clinician reminded Micheal Roth that grounding skills could be utilized to temporarily distract from distressing thoughts and feelings, and covered 'physical' category today, with examples such as focusing on his breathing, using a variety of grounding objects to touch such as smooth stones, a ring, or soft fabric, stretching, running cool water over his skin, and more.  Clinician inquired about which one's Micheal Roth found appealing, and could see himself adding to developing coping skillset during treatment.  Clinician inquired about which one's Micheal Roth would consider adding to available coping skills.  Intervention was effective, as evidenced by Micheal Roth actively participating in discussion on subject and practicing some of these in  session.  He expressed receptiveness to several, such as running cool water  on his skin, squeezing the armrests on his chair, clenching his fists, fidgeting with his tools, tapping his fingers on a surface, using a fidget spinner as a grounding object, wiggling his toes, doing a stretching routine, and doing deep breathing.  Micheal Roth stated "I think these have a lot of benefit".  Clinician will continue to monitor.    Plan: Follow up again virtually tomorrow.    Diagnosis: Major depressive disorder, recurrent, severe; and Generalized Anxiety Disorder.  Collaboration of Care:   Medication Management AEB Dr. Alfonse Flavors or Hillery Jacks, NP                                          Case Manager AEB Jeri Modena, CNA                                                   Patient/Guardian was advised Release of Information must be obtained prior to any record release in order to collaborate their care with an outside provider. Patient/Guardian was advised if they have not already done so to contact the registration department to sign all necessary forms in order for [place] to release information regarding their care.    Consent: Patient/Guardian gives verbal consent for treatment and assignment of benefits for services provided during this visit. Patient/Guardian expressed understanding and agreed to proceed.   Noralee Stain, [place], LCAS 07/09/23

## 2023-07-10 ENCOUNTER — Other Ambulatory Visit (HOSPITAL_COMMUNITY): Payer: 59

## 2023-07-10 ENCOUNTER — Telehealth (HOSPITAL_BASED_OUTPATIENT_CLINIC_OR_DEPARTMENT_OTHER): Payer: 59 | Admitting: Psychiatry

## 2023-07-10 ENCOUNTER — Encounter (HOSPITAL_COMMUNITY): Payer: Self-pay | Admitting: Psychiatry

## 2023-07-10 VITALS — Wt 250.0 lb

## 2023-07-10 DIAGNOSIS — F331 Major depressive disorder, recurrent, moderate: Secondary | ICD-10-CM | POA: Diagnosis not present

## 2023-07-10 DIAGNOSIS — F4312 Post-traumatic stress disorder, chronic: Secondary | ICD-10-CM | POA: Diagnosis not present

## 2023-07-10 MED ORDER — ESCITALOPRAM OXALATE 20 MG PO TABS: 20.0000 mg | ORAL_TABLET | Freq: Every day | ORAL | 1 refills | Status: DC

## 2023-07-10 MED ORDER — HYDROXYZINE HCL 50 MG PO TABS: 50.0000 mg | ORAL_TABLET | Freq: Every evening | ORAL | 1 refills | Status: AC | PRN
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
## 2023-07-11 ENCOUNTER — Other Ambulatory Visit (HOSPITAL_COMMUNITY): Payer: 59

## 2023-07-11 ENCOUNTER — Other Ambulatory Visit (HOSPITAL_COMMUNITY): Payer: 59 | Attending: Psychiatry | Admitting: Licensed Clinical Social Worker

## 2023-07-11 DIAGNOSIS — F411 Generalized anxiety disorder: Secondary | ICD-10-CM | POA: Diagnosis present

## 2023-07-11 DIAGNOSIS — F332 Major depressive disorder, recurrent severe without psychotic features: Secondary | ICD-10-CM

## 2023-07-11 NOTE — Progress Notes (Signed)
Virtual Visit via Video Note   I connected with Micheal Roth on 07/11/23 at  9:00 AM EDT by a video enabled telemedicine application and verified that I am speaking with the correct person using two identifiers.   At orientation to the IOP program, Case Manager discussed the limitations of evaluation and management by telemedicine and the availability of in person appointments. The patient expressed understanding and agreed to proceed with virtual visits throughout the duration of the program.   Location:  Patient: Patient Home Provider: [facility] Office   History of Present Illness: MDD and GAD   Observations/Objective: Check In: Case Manager checked in with all participants to review discharge dates, insurance authorizations, work-related documents and needs from the treatment team regarding medications. Micheal Roth stated needs and engaged in discussion.    Initial Therapeutic Activity: Counselor facilitated a check-in with Micheal Roth to assess for safety, sobriety and medication compliance.  Counselor also inquired about Micheal Roth's current emotional ratings, as well as any significant changes in thoughts, feelings or behavior since previous check in.  Micheal Roth presented for session on time and was alert, oriented x5, with no evidence or self-report of active SI/HI or A/V H.  Micheal Roth reported compliance with medication and denied use of alcohol or illicit substances.  Micheal Roth reported scores of 1/10 for depression, 1/10 for anxiety, and 0/10 for anger/irritability.  Micheal Roth denied any recent outbursts or panic attacks.  Micheal Roth reported that a recent success was attending a virtual appointment with his psychiatrist.  Micheal Roth reported that a struggle was struggling with a headache yesterday.  Micheal Roth reported that his goal today is to get out of the house and run some errands.          Second Therapeutic Activity: Counselor introduced Micheal Roth, [employer]  Pharmacist, to provide psychoeducation on topic of medication compliance with members today.  Micheal Roth provided psychoeducation on classes of medications such as antidepressants, antipsychotics, what symptoms they are intended to treat, and any side effects one might encounter while on a particular prescription.  Time was allowed for clients to ask any questions they might have of [facility] regarding this specialty.  Intervention effectiveness could not be measured, as client did not participate.    Third Therapeutic Activity: Counselor discussed topic of sleep hygiene today with group.  Counselor defined this as the habits, behaviors and environmental factors that can be adjusted to improve overall sleep quality.  Counselor also discussed how lack of consistent sleep can negatively affect mood and overall mental health.  Counselor provided members with a screening to complete assessing typical barriers one might face in achieving quality sleep at night (i.e. struggling to get up in the morning, waking up throughout the night, tossing/turning, etc), and inquired about members' perception of sleep hygiene at present.  Counselor offered techniques to members via a virtual handout which could be implemented in order to improve sleep hygiene, such as sticking to a normal morning/night routine, avoiding using of electronics too close to bedtime, avoiding heavy meals before bed, using a sleep journal to track changes and address anxious thoughts, as well as avoiding naps during the daytime, ensuring proper use of medications if prescribed any by provider(s), and more.  Counselor inquired about changes members intend to make to sleep hygiene based upon information covered today.  Interventions were effective, as evidenced by Micheal Roth actively participating in discussion on subject, reporting that prior to beginning group therapy, he was averaging 4-5 hours per night.  Micheal Roth stated "I know I snore.  I think I have sleep  apnea".  Micheal Roth also completed a sleep assessment, which revealed that he is getting a 'deprived' level of sleep at present.  Micheal Roth reported that he plans to improve sleep hygiene over the course of treatment by maintaining a normal sleep/wake cycle, avoiding use of electronics too close to bedtime reading books at bedtime as a new relaxing sleep ritual, cutting back on cigarette use at night, sleeping in his bed more frequently, stop checking the time on his phone each night, cutting back on naps during the day, and scheduling a sleep study to explore solutions to address his potential sleep apnea.    Assessment and Plan: Counselor recommends that Micheal Roth remain in IOP treatment to better manage mental health symptoms, ensure stability and pursue completion of treatment plan goals. Counselor recommends adherence to crisis/safety plan, taking medications as prescribed, and following up with medical professionals if any issues arise.    Follow Up Instructions: Counselor will send Webex link for session tomorrow.  Micheal Roth was advised to call back or seek an in-person evaluation if the symptoms worsen or if the condition fails to improve as anticipated.   Collaboration of Care:   Medication Management AEB Dr. Alfonse Flavors or Hillery Jacks, NP                                          Case Manager AEB Jeri Modena, CNA    Patient/Guardian was advised Release of Information must be obtained prior to any record release in order to collaborate their care with an outside provider. Patient/Guardian was advised if they have not already done so to contact the registration department to sign all necessary forms in order for [place] to release information regarding their care.    Consent: Patient/Guardian gives verbal consent for treatment and assignment of benefits for services provided during this visit. Patient/Guardian expressed understanding and agreed to proceed.   I provided 180 minutes of  non-face-to-face time during this encounter.   Noralee Stain, [place], LCAS 07/11/23

## 2023-07-11 NOTE — Progress Notes (Signed)
Virtual Visit via Video Note  I connected with Micheal Roth  on 07/11/23 at  9:00 AM EST by a video enabled telemedicine application and verified that I am speaking with the correct person using two identifiers.  Location: Patient: Home  Provider: Office   I discussed the limitations of evaluation and management by telemedicine and the availability of in person appointments. The patient expressed understanding and agreed to proceed.   I discussed the assessment and treatment plan with the patient. The patient was provided an opportunity to ask questions and all were answered. The patient agreed with the plan and demonstrated an understanding of the instructions.   The patient was advised to call back or seek an in-person evaluation if the symptoms worsen or if the condition fails to improve as anticipated.   Micheal Sprinkles, MD  Psych Resident, PGY-3  [facility] Intensive Outpatient Program Psych Discharge Summary  Micheal Roth [id number]  Admission date: 06/19/2023 Discharge date: 07/11/23   Reason for admission: Micheal Roth is a 47 y.o. male with a psychiatric history of depression, anxiety, and adjustment disorder who presented to IOP as a step-down after completing PHP for continued care of his depression and anxiety surrounding his relationship and return to work.    On day of discharge, [name], HI, AVH, and paranoia. He denies somatic complaints and medication adverse effects. He slept well, appetite is intact, and he is voiding appropriately.  He is able to contract for safety upon discharge.   Progress in Program Toward Treatment Goals: Progressing  Progress (rationale): Going back to work and feeling good about it. He feels better about returning to work. He has regained his strength to advocate for himself and setting boundaries.   ROS   There were no vitals taken for this visit.  Psychiatric Specialty Exam: *Unable to meet with patient on  video individually due to technical issues, but spoke over the phone and observed him on video during group session.* - Items that were unable to be assessed in individual meeting due to this will be marked with ** General Appearance: **  Eye Contact:  **  Speech:  Clear, coherent, normal rate   Volume:  Normal   Mood:  "I'm great"  Affect:  **  Thought Content: Logical, linear  Suicidal Thoughts: Denied active and passive Micheal    Thought Process:  Coherent, goal-directed, linear   Orientation:  A&Ox4   Memory:  Immediate good  Judgment:  Good  Insight:  Good  Concentration:  Attention and concentration good   Recall:  Good  Fund of Knowledge: Good  Language: Good, fluent  Psychomotor Activity: **  Akathisia:  **  AIMS (if indicated): **  Assets:  Communication Skills Desire for Improvement Financial Resources/Insurance Housing Leisure Time Physical Health Resilience Social Support Talents/Skills Transportation Vocational/Educational  ADL's:  Intact  Cognition: WNL  Sleep:  Good       Discharge Plan: Micheal Roth successfully completed the IOP program after stepping down from PHP. He poses no safety concerns at this time, and medications were not adjusted by IOP providers. Continue medications as prescribed and follow up as below:  -- Follow with Cyril Loosen, LCSW for therapy  -- Follow with Dr. Lolly Mustache, MD for medication  Patient/Guardian was advised Release of Information must be obtained prior to any record release in order to collaborate their care with an outside provider. Patient/Guardian was advised if they have not already done so to contact the  registration department to sign all necessary forms in order for [place] to release information regarding their care.   Consent: Patient/Guardian gives verbal consent for treatment and assignment of benefits for services provided during this visit. Patient/Guardian expressed understanding and agreed to proceed.    Micheal Sprinkles, MD Psych Resident, PGY-3 07/11/23

## 2023-07-12 ENCOUNTER — Other Ambulatory Visit (HOSPITAL_COMMUNITY): Payer: 59

## 2023-07-12 ENCOUNTER — Ambulatory Visit (HOSPITAL_COMMUNITY): Payer: 59 | Admitting: Licensed Clinical Social Worker

## 2023-07-12 ENCOUNTER — Other Ambulatory Visit (HOSPITAL_COMMUNITY): Payer: 59 | Attending: Psychiatry | Admitting: Psychiatry

## 2023-07-12 DIAGNOSIS — F332 Major depressive disorder, recurrent severe without psychotic features: Secondary | ICD-10-CM | POA: Diagnosis present

## 2023-07-12 DIAGNOSIS — F411 Generalized anxiety disorder: Secondary | ICD-10-CM | POA: Insufficient documentation

## 2023-07-12 NOTE — Progress Notes (Signed)
Virtual Visit via Video Note  I connected with Micheal Roth on @TODAY @ at  9:00 AM EST by a video enabled telemedicine application and verified that I am speaking with the correct person using two identifiers.  Location: Patient: at home Provider: at office   I discussed the limitations of evaluation and management by telemedicine and the availability of in person appointments. The patient expressed understanding and agreed to proceed.  I discussed the assessment and treatment plan with the patient. The patient was provided an opportunity to ask questions and all were answered. The patient agreed with the plan and demonstrated an understanding of the instructions.   The patient was advised to call back or seek an in-person evaluation if the symptoms worsen or if the condition fails to improve as anticipated.  I provided 20 minutes of non-face-to-face time during this encounter.   Jeri Modena, M.Ed,CNA   Patient ID: Micheal Roth, male   DOB: Mar 23, 1976, 47 y.o.   MRN: [id number] D:As per recent CCA states:  "Micheal Roth was referred to [facility] by [facility] 1) MH: Micheal Roth reports he has increased anxiety, depression, and panic attacks. He has not been to work due to increased MH symptoms. 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 He has been unable to attend work for the last few weeks due to his MH symptoms. 4) Financial: He currently lives in a "family house" and does not have to pay rent. The house is being sold. He does not have a car. Lives at [address] [place] [address]. Micheal Roth reports 1-2 therapy sessions as a teen, and a few visits to [facility] recently, but denies any other treatment history. Denies attempts/SI/HI/AVH/weapons/NSSIB. Reports pSI, hopelessness, worthlessness. Denies supports. Reports he knows there is mental health issues on his father's side, but does not know what due to not  being close with that side of his family."   Pt stepped down from virtual PHP to virtual MH-IOP today.  Pt reports that the groups in PHP and the groups this morning in MH-IOP went well.  States he is back with his girlfriend.  She has agreed to seek couples counseling.  On a scale of 1-10 (10 being the worst), pt rates his depression at 2 and anxiety at a 5.  Denies SI/HI or A/V hallucinations.  Scored 17 on PHQ-9.  Reports that [employer] has him out of work through 07-04-23.  Pt will possibly need an extension, if still in MH-IOP during that time.    Pt attended thirteen MH-IOP days.  Reports feeling much better.  "I feel like my old self now.  This has been the best experience I've had.  Group was very, very helpful."  On a scale of 1-10 (10 being the worst), pt rates his anxiety and depression at 0.  Denies SI/HI or A/V hallucinations.  A:  D/C today.  F/U with Cyril Loosen, LCSW on 07-16-23 @ 3pm( in person) and Dr. Lolly Mustache on 08-24-23 11:30 a.m (virtual).  Encouraged support groups through [facility] [phone].  RTW on 07-23-23; without any restrictions.   Collaboration of Care: Collaborate with Dr. Lamar Sprinkles AEB, dr. Gerlene Burdock, Hillery Jacks, NP AEB; Noralee Stain, LCSW AEB, Cyril Loosen, LCSW AEB.   Patient/Guardian was advised Release of Information must be obtained prior to any record release in order to collaborate their care with an outside provider. Patient/Guardian was advised if  they have not already done so to contact the registration department to sign all necessary forms in order for [place] to release information regarding their care.  Consent: Patient/Guardian gives verbal consent for treatment and assignment of benefits for services provided during this visit. Patient/Guardian expressed understanding and agreed to proceed.   R:  Pt receptive.  Jeri Modena, M.Ed,CNA

## 2023-07-12 NOTE — Patient Instructions (Signed)
D:  Patient successfully completed virtual MH-IOP today.  A:  Discharge today.  Follow up with Dr. Lolly Mustache on 08-24-23 @ 11:30 a.m (virtual).Cyril Loosen, LCSW on 07-16-23 @ 3pm (in person).  Encouraged support groups through [facility] [phone].  Return to work on 07-23-23, without any restrictions.  R:  Patient receptive.

## 2023-07-12 NOTE — Progress Notes (Signed)
Virtual Visit via Video Note   I connected with Micheal Roth on 07/12/23 at  9:00 AM EDT by a video enabled telemedicine application and verified that I am speaking with the correct person using two identifiers.   At orientation to the IOP program, Case Manager discussed the limitations of evaluation and management by telemedicine and the availability of in person appointments. The patient expressed understanding and agreed to proceed with virtual visits throughout the duration of the program.   Location:  Patient: Patient Home Provider: OPT BH Office   History of Present Illness: MDD and GAD   Observations/Objective: Check In: Case Manager checked in with all participants to review discharge dates, insurance authorizations, work-related documents and needs from the treatment team regarding medications. Micheal Roth stated needs and engaged in discussion.    Initial Therapeutic Activity: Counselor facilitated a check-in with Micheal Roth to assess for safety, sobriety and medication compliance.  Counselor also inquired about Micheal Roth's current emotional ratings, as well as any significant changes in thoughts, feelings or behavior since previous check in.  Micheal Roth presented for session on time and was alert, oriented x5, with no evidence or self-report of active SI/HI or A/V H.  Micheal Roth reported compliance with medication and denied use of alcohol or illicit substances.  Micheal Roth reported scores of 0/10 for depression, 0/10 for anxiety, and 0/10 for anger/irritability.  Micheal Roth denied any recent outbursts or panic attacks.  Micheal Roth reported that a recent success was spending time with his mother yesterday, and having a productive conversation on how to get a new car.  Micheal Roth denied any new challenges.  Micheal Roth reported that his goal today is to go check out a car with his stepdad.        Second Therapeutic Activity: Counselor introduced topic of stress management  today.  Counselor provided definition of stress as feeling tense, overwhelmed, worn out, and/or exhausted, and noted that in small amounts, stress can be motivating until things become too overwhelming to manage.  Counselor also explained how stress can be acute (brief but intense) or chronic (long-lasting) and this can impact the severity of symptoms one can experience in the physical, emotional, and behavioral categories.  Counselor inquired about members' specific stressors, how long they have been prevalent, and the various symptoms that tend to manifest as a result.  Counselor also offered several stress management strategies to help improve members' coping ability, including journaling, gratitude practice, relaxation techniques, and time management tips.  Counselor also explained that research has shown a strong support network composed of trusted family, friends, or community members can increase resilience in times of stress, and inquired about who members can reach out to for help in managing stressors.  Counselor encouraged members to consider discussing stressor 'red flags' with their close supports that can be monitored and strategies for assisting them in times of crisis.  Intervention was effective, as evidenced by Micheal Roth actively participating in discussion on subject, reporting that his most significant stressors include lack of confidence, pain/fatigue, world economy, driving on the highway, and money worries.  Micheal Roth was able to identify several warning signs related to stress, including agitation, increased blood pressure, fatigue, headaches, physical pain, upset stomach, and teeth grinding.  Micheal Roth reported that his stress management goal is to improve overall work life balance and set aside more time for self-care activities as a healthy outlet for stress.  Micheal Roth also expressed receptiveness to several stress management strategies practiced today in session, including  practicing a deep breathing exercise, keeping  a stress journal to track stressors, and progressive muscle relaxation.      Assessment and Plan: Micheal Roth has completed MHIOP and will be discharged today.  Counselor recommends adherence to crisis/safety plan, taking medications as prescribed, and following up with medical professionals if any issues arise.    Follow Up Instructions: Micheal Roth was advised to call back or seek an in-person evaluation if the symptoms worsen or if the condition fails to improve as anticipated.   Collaboration of Care:   Medication Management AEB Dr. Alfonse Flavors or Hillery Jacks, NP                                          Case Manager AEB Jeri Modena, CNA    Patient/Guardian was advised Release of Information must be obtained prior to any record release in order to collaborate their care with an outside provider. Patient/Guardian was advised if they have not already done so to contact the registration department to sign all necessary forms in order for [place] to release information regarding their care.    Consent: Patient/Guardian gives verbal consent for treatment and assignment of benefits for services provided during this visit. Patient/Guardian expressed understanding and agreed to proceed.   I provided 180 minutes of non-face-to-face time during this encounter.   Noralee Stain, [place], LCAS 07/12/23

## 2023-07-13 ENCOUNTER — Other Ambulatory Visit (HOSPITAL_COMMUNITY): Payer: 59

## 2023-07-16 ENCOUNTER — Encounter (HOSPITAL_COMMUNITY): Payer: Self-pay

## 2023-07-16 ENCOUNTER — Other Ambulatory Visit (HOSPITAL_COMMUNITY): Payer: 59

## 2023-07-16 ENCOUNTER — Ambulatory Visit (INDEPENDENT_AMBULATORY_CARE_PROVIDER_SITE_OTHER): Payer: 59 | Admitting: Licensed Clinical Social Worker

## 2023-07-16 DIAGNOSIS — F411 Generalized anxiety disorder: Secondary | ICD-10-CM

## 2023-07-16 DIAGNOSIS — F332 Major depressive disorder, recurrent severe without psychotic features: Secondary | ICD-10-CM | POA: Diagnosis not present

## 2023-07-16 NOTE — Progress Notes (Signed)
THERAPIST PROGRESS NOTE   Session Date: 07/16/2023  Session Time: 1505 - 1547  Participation Level: Active  Behavioral Response: Casual and Well GroomedAlertAnxious and Euthymic  Type of Therapy: Individual Therapy  Treatment Goals addressed: N/A. Clinician and pt met in order to develop new tx plan in relation to presenting sxs following successful stepdown from PHP and IOP programs to OPT. Active     Anxiety     STG: Macey will reduce frequency of avoidant behaviors by 50% as evidenced by self-report in therapy sessions (Initial)     Start:  07/16/23    Expected End:  01/14/24         STG: Report a decrease in anxiety symptoms as evidenced by an overall reduction in anxiety score by a minimum of 25% on the Generalized Anxiety Disorder Scale (GAD-7) (Initial)     Start:  07/16/23    Expected End:  01/14/24         LTG: "Learn how to deal with it when it comes and find what triggers my anxiety" (Initial)     Start:  07/16/23    Expected End:  01/14/24           OP Depression     LTG: "Try to make sure I can deal with the depression as it comes" (Initial)     Start:  07/16/23    Expected End:  01/14/24         LTG: Reduce frequency, intensity, and duration of depression symptoms so that daily functioning is improved (Initial)     Start:  07/16/23    Expected End:  01/14/24         LTG: Increase coping skills to manage depression and improve ability to perform daily activities (Initial)     Start:  07/16/23    Expected End:  01/14/24         STG: Reduce overall depression score by a minimum of 25% on the Patient Health Questionnaire (PHQ-9) (Initial)     Start:  07/16/23    Expected End:  01/14/24         STG: [name] will identify cognitive patterns and beliefs that support depression (Initial)     Start:  07/16/23    Expected End:  01/14/24         STG: [name] will reduce frequency of avoidant behaviors by 50% as evidenced by self-report in  therapy sessions (Initial)     Start:  07/16/23    Expected End:  01/14/24             07/16/2023    3:14 PM 05/01/2023    3:00 PM  GAD 7 : Generalized Anxiety Score  Nervous, Anxious, on Edge 1 3  Control/stop worrying 0 3  Worry too much - different things 0 3  Trouble relaxing 1 3  Restless 3 3  Easily annoyed or irritable 1 1  Afraid - awful might happen 0 1  Total GAD 7 Score 6 17  Anxiety Difficulty Somewhat difficult       07/16/2023    3:17 PM 07/10/2023    1:39 PM 06/19/2023    9:36 AM 05/30/2023   10:29 AM 05/21/2023    2:28 PM  Depression screen PHQ 2/9  Decreased Interest 1 1 2 3 3   Down, Depressed, Hopeless 1 1 2 1 3   PHQ - 2 Score 2 2 4 4 6   Altered sleeping 1 1 3 3 3   Tired, decreased energy 1 1 2  3 3  Change in appetite 0 1 2 3 2   Feeling bad or failure about yourself  1 0 2 3 2   Trouble concentrating 0 2 2 1 2   Moving slowly or fidgety/restless 0 1 1 3 1   Suicidal thoughts 1 0 1 1 0  PHQ-9 Score 6 8 17 21 19   Difficult doing work/chores Somewhat difficult Somewhat difficult Extremely dIfficult Extremely dIfficult Extremely dIfficult   Flowsheet Row Counselor from 07/16/2023 in [facility] Video Visit from 07/10/2023 in [facility] Counselor from 06/19/2023 in [facility]  C-SSRS RISK CATEGORY Low Risk Error: Q3, 4, or 5 should not be populated when Q2 is No Error: Question 6 not populated      ProgressTowards Goals: Initial  Interventions: Solution Focused and Supportive  Summary: Swen "[name]" is a 47 y.o. Caucasian male with past psych history of MDD and GAD,  presenting for initial session in order to establish ongoing OPT services with provider. Pt currently receiving med man services via Dr. Lolly Mustache, MD and recent completion of PHP and MHIOP programs over the past eight weeks. Patient actively engaged in session. Patient responded well to  interventions. Patient continues to meet criteria for MDD and GAD. Patient will continue to benefit from engagement in outpatient therapy due to being the least restrictive service to meet presenting needs.    Suicidal/Homicidal: No  Therapist Response: Clinician utilized CBT, MI, and solution focussed techniques to support pt in exploring applicable tx goals in relation to sxs and dx. Therapist provided support and empathy to patient during session.  Plan: Return again in 1 weeks.  Diagnosis:  Encounter Diagnoses  Name Primary?   Severe episode of recurrent major depressive disorder, without psychotic features (HCC) Yes   GAD (generalized anxiety disorder)     Collaboration of Care: Other None necessary at this time.  Patient/Guardian was advised Release of Information must be obtained prior to any record release in order to collaborate their care with an outside provider. Patient/Guardian was advised if they have not already done so to contact the registration department to sign all necessary forms in order for [place] to release information regarding their care.   Consent: Patient/Guardian gives verbal consent for treatment and assignment of benefits for services provided during this visit. Patient/Guardian expressed understanding and agreed to proceed.   Leisa Lenz, MSW, LCSW 07/16/2023,  3:51 PM

## 2023-07-17 ENCOUNTER — Other Ambulatory Visit (HOSPITAL_COMMUNITY): Payer: 59

## 2023-07-18 ENCOUNTER — Other Ambulatory Visit (HOSPITAL_COMMUNITY): Payer: 59

## 2023-07-19 ENCOUNTER — Other Ambulatory Visit (HOSPITAL_COMMUNITY): Payer: 59

## 2023-07-20 ENCOUNTER — Other Ambulatory Visit (HOSPITAL_COMMUNITY): Payer: 59

## 2023-07-23 ENCOUNTER — Ambulatory Visit (INDEPENDENT_AMBULATORY_CARE_PROVIDER_SITE_OTHER): Payer: 59 | Admitting: Licensed Clinical Social Worker

## 2023-07-23 DIAGNOSIS — F332 Major depressive disorder, recurrent severe without psychotic features: Secondary | ICD-10-CM

## 2023-07-23 DIAGNOSIS — F411 Generalized anxiety disorder: Secondary | ICD-10-CM | POA: Diagnosis not present

## 2023-07-23 NOTE — Progress Notes (Signed)
THERAPIST PROGRESS NOTE   Session Date: 07/23/2023  Session Time: 1010 - 1055  Participation Level: Active  Behavioral Response: Neat and Well GroomedAlertAnxious  Type of Therapy: Individual Therapy  Treatment Goals addressed:  - LTG: "Try to make sure I can deal with the depression as it comes" (OP Depression) - LTG: Reduce frequency, intensity, and duration of depression symptoms so that daily functioning is improved (OP Depression) - LTG: Increase coping skills to manage depression and improve ability to perform daily activities (OP Depression) - STG: Reduce overall depression score by a minimum of 25% on the Patient Health Questionnaire (PHQ-9) (OP Depression) - STG: Lark will identify cognitive patterns and beliefs that support depression (OP Depression) - STG: Chevis will reduce frequency of avoidant behaviors by 50% as evidenced by self-report in therapy sessions (Anxiety) - STG: Report a decrease in anxiety symptoms as evidenced by an overall reduction in anxiety score by a minimum of 25% on the Generalized Anxiety Disorder Scale (GAD-7) (Anxiety) - LTG: "Learn how to deal with it when it comes and find what triggers my anxiety" (Anxiety)  ProgressTowards Goals: Progressing  Interventions: CBT, Motivational Interviewing, Solution Focused, and Strength-based  Summary: Micheal Roth is a 47 y.o. male with past psych history of MDD and GAD, presenting for follow-up therapy session in efforts to improve management of depressive and anxious symptoms and presenting stressors. Patient actively engaged in session, detailing events of the past week and this morning, sharing of having experienced minor anxious sxs this morning with returning to work and receiving a phone call prior to visit to determine as to why pt had clocked out, expressing of having effectively managed stress by not allowing self to over think issue. Pt reported of having enjoyed the remainder of his week last week  and this weekend prior to returning to work, sharing of having kept himself busy and not lent too much focus to returning to work. Patient responded well to interventions. Patient continues to meet criteria for MDD and GAD . Patient will continue to benefit from engagement in outpatient therapy due to being the least restrictive service to meet presenting needs.    Suicidal/Homicidal: No  Therapist Response: Clinician utilized CBT, MI, solution focused, and strengths based techniques to address pt's reported experience with presenting sxs and stressors over the past week. Supported pt in exploring thoughts and feelings surrounding returning to work, further exploring presence of anxious sxs over the past week approaching return to work. Evoked pt's individual thoughts and feelings surrounding how to manage issues within the workplace if they were to arise. Begin processing pt's own individual experiences and hx surrounding childhood and adolescence and the environmental factors that contributed to increasing anxious sxs. Therapist provided support and empathy to patient during session.  Plan: Return again in 1 weeks.  Diagnosis:  Encounter Diagnoses  Name Primary?   Severe episode of recurrent major depressive disorder, without psychotic features (HCC) Yes   GAD (generalized anxiety disorder)     Collaboration of Care: Other None necessary at this time.  Patient/Guardian was advised Release of Information must be obtained prior to any record release in order to collaborate their care with an outside provider. Patient/Guardian was advised if they have not already done so to contact the registration department to sign all necessary forms in order for [place] to release information regarding their care.   Consent: Patient/Guardian gives verbal consent for treatment and assignment of benefits for services provided during this visit. Patient/Guardian expressed understanding and agreed  to proceed.   Leisa Lenz, MSW, LCSW 07/23/2023,  10:57 AM

## 2023-07-26 ENCOUNTER — Ambulatory Visit (HOSPITAL_COMMUNITY): Payer: 59 | Admitting: Psychiatry

## 2023-07-31 ENCOUNTER — Ambulatory Visit (HOSPITAL_COMMUNITY): Payer: 59 | Admitting: Licensed Clinical Social Worker

## 2023-07-31 DIAGNOSIS — F331 Major depressive disorder, recurrent, moderate: Secondary | ICD-10-CM | POA: Diagnosis not present

## 2023-07-31 DIAGNOSIS — F411 Generalized anxiety disorder: Secondary | ICD-10-CM

## 2023-07-31 NOTE — Progress Notes (Signed)
THERAPIST PROGRESS NOTE   Session Date: 07/31/2023  Session Time: 1008 - 1053  Participation Level: Active  Behavioral Response: Neat and Well GroomedAlertEuthymic  Type of Therapy: Individual Therapy  Treatment Goals addressed:  - LTG: "Try to make sure I can deal with the depression as it comes" (OP Depression) - LTG: Reduce frequency, intensity, and duration of depression symptoms so that daily functioning is improved (OP Depression) - LTG: Increase coping skills to manage depression and improve ability to perform daily activities (OP Depression) - STG: Reduce overall depression score by a minimum of 25% on the Patient Health Questionnaire (PHQ-9) (OP Depression) - STG: Micheal Roth will identify cognitive patterns and beliefs that support depression (OP Depression) - STG: Micheal Roth will reduce frequency of avoidant behaviors by 50% as evidenced by self-report in therapy sessions (Anxiety) - STG: Report a decrease in anxiety symptoms as evidenced by an overall reduction in anxiety score by a minimum of 25% on the Generalized Anxiety Disorder Scale (GAD-7) (Anxiety) - LTG: "Learn how to deal with it when it comes and find what triggers my anxiety" (Anxiety)  ProgressTowards Goals: Progressing  Interventions: CBT, Motivational Interviewing, Solution Focused, and Strength-based  Summary: Micheal Roth is a 47 y.o. male with past psych history of MDD and GAD, presenting for follow-up therapy session in efforts to improve management of depressive and anxious symptoms. Patient actively engaged in session, detailing of having a good day thus far due to it being his birthday and having experienced pleasant interactions with others across settings this morning. Micheal Roth engaged in readministration of PHQ9 and GAD7, processing noted sxs and challenges resulting in increased depressive sxs, and reduced anxious sxs. Further processed areas in which Micheal Roth identified depressed moods impacting him throughout the  week, noting of physical health concerns being a contributing factor. Revisited Micheal Roth's efforts at beginning tracking moods and points of gratitude, exploring barriers and challenging to implementing the act of journal. Revisited previous areas of discussion surrounding challenges transitioning back to work, identifying noted areas of progress and improved supports in the workplace. Patient responded well to interventions. Patient continues to meet criteria for MDD and GAD . Patient will continue to benefit from engagement in outpatient therapy due to being the least restrictive service to meet presenting needs.     07/31/2023   10:16 AM 07/16/2023    3:17 PM 07/10/2023    1:39 PM 06/19/2023    9:36 AM 05/30/2023   10:29 AM  Depression screen PHQ 2/9  Decreased Interest 1 1 1 2 3   Down, Depressed, Hopeless 1 1 1 2 1   PHQ - 2 Score 2 2 2 4 4   Altered sleeping 0 1 1 3 3   Tired, decreased energy 2 1 1 2 3   Change in appetite 1 0 1 2 3   Feeling bad or failure about yourself  1 1 0 2 3  Trouble concentrating 1 0 2 2 1   Moving slowly or fidgety/restless 0 0 1 1 3   Suicidal thoughts 1 1 0 1 1  PHQ-9 Score 8 6 8 17 21   Difficult doing work/chores Somewhat difficult Somewhat difficult Somewhat difficult Extremely dIfficult Extremely dIfficult   Flowsheet Row Counselor from 07/31/2023 in [facility] from 07/16/2023 in [facility] at [facility] Video Visit from 07/10/2023 in [facility]  C-SSRS RISK CATEGORY Low Risk Low Risk Error: Q3, 4, or 5 should not be populated when Q2 is No  07/31/2023   10:12 AM 07/16/2023    3:14 PM 05/01/2023    3:00 PM  GAD 7 : Generalized Anxiety Score  Nervous, Anxious, on Edge 1 1 3   Control/stop worrying 1 0 3  Worry too much - different things 0 0 3  Trouble relaxing 0 1 3  Restless 1 3 3   Easily annoyed or irritable 1 1 1   Afraid -  awful might happen 0 0 1  Total GAD 7 Score 4 6 17   Anxiety Difficulty Not difficult at all Somewhat difficult    Suicidal/Homicidal: No  Therapist Response: Clinician utilized CBT, MI, solution focused, and strengths based techniques to address Micheal Roth's reported experience with presenting sxs and stressors over the past week. Supported Micheal Roth in reflecting on events of past week, re administered PHQ9 and GAD7, further exploring Micheal Roth's experience and awareness of stressors and sxs related to variances in depression and anxiety. Provide support and guidance surrounding reported physical health challenges and areas to consider approaching with PCP. Prompt Micheal Roth to increased intent on tracking moods, stressors and triggers, and points of gratitude in efforts to identify trends and begin utilizing re-framing techniques. Therapist provided support and empathy to patient during session.  Plan: Return again in 1 weeks.  Diagnosis:  Encounter Diagnoses  Name Primary?   MDD (major depressive disorder), recurrent episode, moderate (HCC) Yes   GAD (generalized anxiety disorder)      Collaboration of Care: Other None necessary at this time.  Patient/Guardian was advised Release of Information must be obtained prior to any record release in order to collaborate their care with an outside provider. Patient/Guardian was advised if they have not already done so to contact the registration department to sign all necessary forms in order for [place] to release information regarding their care.   Consent: Patient/Guardian gives verbal consent for treatment and assignment of benefits for services provided during this visit. Patient/Guardian expressed understanding and agreed to proceed.   Leisa Lenz, MSW, LCSW 07/31/2023,  11:01 AM

## 2023-08-06 ENCOUNTER — Ambulatory Visit (HOSPITAL_COMMUNITY): Payer: 59 | Admitting: Licensed Clinical Social Worker

## 2023-08-06 DIAGNOSIS — F411 Generalized anxiety disorder: Secondary | ICD-10-CM

## 2023-08-06 DIAGNOSIS — F331 Major depressive disorder, recurrent, moderate: Secondary | ICD-10-CM

## 2023-08-06 NOTE — Progress Notes (Signed)
THERAPIST PROGRESS NOTE   Session Date: 08/06/2023  Session Time: 1008 - 1049  Participation Level: Active  Behavioral Response: Neat and Well GroomedAlertEuthymic  Type of Therapy: Individual Therapy  Treatment Goals addressed:  - LTG: "Try to make sure I can deal with the depression as it comes" (OP Depression) - LTG: Reduce frequency, intensity, and duration of depression symptoms so that daily functioning is improved (OP Depression) - LTG: Increase coping skills to manage depression and improve ability to perform daily activities (OP Depression) - STG: Reduce overall depression score by a minimum of 25% on the Patient Health Questionnaire (PHQ-9) (OP Depression) - STG: Freemon will identify cognitive patterns and beliefs that support depression (OP Depression) - STG: Hartley will reduce frequency of avoidant behaviors by 50% as evidenced by self-report in therapy sessions (Anxiety) - STG: Report a decrease in anxiety symptoms as evidenced by an overall reduction in anxiety score by a minimum of 25% on the Generalized Anxiety Disorder Scale (GAD-7) (Anxiety) - LTG: "Learn how to deal with it when it comes and find what triggers my anxiety" (Anxiety)  ProgressTowards Goals: Progressing  Interventions: CBT, Motivational Interviewing, and Strength-based  Summary: Micheal Roth is a 47 y.o. male with past psych history of MDD and GAD, presenting for follow-up therapy session in efforts to improve management of depressive and anxious symptoms. Patient actively engaged in session, sharing of having enjoyed his birthday and time spent with family over the past week. Pt shared of time spent with family for holidays, having enjoyed time off work, and spent time relaxing and completing necessary tasks around the home in preparation for moving, prior to getting sick over the weekend. Pt detailed having experienced increased depressive and anxious sxs over the weekend, primarily in relation to  getting sick and unable to do things needing completing. Pt shared barriers to being able to obtain journals in order to begin tracking moods and expressed intent to doing so over the coming week. Explored prior areas of focus surrounding physical health and pt's desires to address physical health concerns and act on becoming more healthy in aims of supporting with mental health. Explored previous efforts and success at quitting smoking, present stressors in pt's life at that time, and what efforts have worked for pt previously. Patient responded well to interventions. Patient continues to meet criteria for MDD and GAD . Patient will continue to benefit from engagement in outpatient therapy due to being the least restrictive service to meet presenting needs.      07/31/2023   10:16 AM 07/16/2023    3:17 PM 07/10/2023    1:39 PM 06/19/2023    9:36 AM 05/30/2023   10:29 AM  Depression screen PHQ 2/9  Decreased Interest 1 1 1 2 3   Down, Depressed, Hopeless 1 1 1 2 1   PHQ - 2 Score 2 2 2 4 4   Altered sleeping 0 1 1 3 3   Tired, decreased energy 2 1 1 2 3   Change in appetite 1 0 1 2 3   Feeling bad or failure about yourself  1 1 0 2 3  Trouble concentrating 1 0 2 2 1   Moving slowly or fidgety/restless 0 0 1 1 3   Suicidal thoughts 1 1 0 1 1  PHQ-9 Score 8 6 8 17 21   Difficult doing work/chores Somewhat difficult Somewhat difficult Somewhat difficult Extremely dIfficult Extremely dIfficult   Flowsheet Row Counselor from 07/31/2023 in [facility] from 07/16/2023 in [facility] at [facility] Video Visit from 07/10/2023 in [facility]  C-SSRS RISK CATEGORY Low Risk Low Risk Error: Q3, 4, or 5 should not be populated when Q2 is No         07/31/2023   10:12 AM 07/16/2023    3:14 PM 05/01/2023    3:00 PM  GAD 7 : Generalized Anxiety Score  Nervous, Anxious, on Edge 1 1 3    Control/stop worrying 1 0 3  Worry too much - different things 0 0 3  Trouble relaxing 0 1 3  Restless 1 3 3   Easily annoyed or irritable 1 1 1   Afraid - awful might happen 0 0 1  Total GAD 7 Score 4 6 17   Anxiety Difficulty Not difficult at all Somewhat difficult    Suicidal/Homicidal: No  Therapist Response: Clinician utilized CBT, MI, and strengths based techniques to support pt in navigating reported sxs and stressors of the past week. Actively supported pt in reflecting on sxs and challenges over the past week, evoking pt's thoughts and feelings surrounding what has gone well for pt as well as challenges, and revisiting previous sessions discussions surrounding improving pt's physical health in efforts to increase overall positive health outcomes. Explored pt's prior hx at quitting cigarettes, what worked well, and what factors lead to pt beginning smoking again. Revisited pt's efforts at tracking moods, encouraging to obtain notebooks in order to better track moods, stressors and triggers, gratitude, and smoking trends in efforts to aid in tracking moods and reduction in tobacco use. Therapist provided support and empathy to patient during session.  Plan: Return again in 1 weeks.  Diagnosis:  Encounter Diagnoses  Name Primary?   MDD (major depressive disorder), recurrent episode, moderate (HCC) Yes   GAD (generalized anxiety disorder)       Collaboration of Care: Other None necessary at this time.  Patient/Guardian was advised Release of Information must be obtained prior to any record release in order to collaborate their care with an outside provider. Patient/Guardian was advised if they have not already done so to contact the registration department to sign all necessary forms in order for [place] to release information regarding their care.   Consent: Patient/Guardian gives verbal consent for treatment and assignment of benefits for services provided during this visit.  Patient/Guardian expressed understanding and agreed to proceed.   Leisa Lenz, MSW, LCSW 08/06/2023,  11:06 AM

## 2023-08-07 ENCOUNTER — Ambulatory Visit
Admission: EM | Admit: 2023-08-07 | Discharge: 2023-08-07 | Disposition: A | Discharge status: Home / Self Care | Payer: 59

## 2023-08-07 DIAGNOSIS — J40 Bronchitis, not specified as acute or chronic: Secondary | ICD-10-CM | POA: Diagnosis not present

## 2023-08-07 DIAGNOSIS — J329 Chronic sinusitis, unspecified: Secondary | ICD-10-CM

## 2023-08-07 MED ORDER — PREDNISONE 20 MG PO TABS: 40.0000 mg | ORAL_TABLET | Freq: Every day | ORAL | 0 refills | Status: AC

## 2023-08-07 MED ORDER — AMOXICILLIN-POT CLAVULANATE 875-125 MG PO TABS: 1.0000 | ORAL_TABLET | Freq: Two times a day (BID) | ORAL | 0 refills | Status: DC

## 2023-08-07 NOTE — ED Provider Notes (Signed)
[facility]    CSN: [id number] Arrival date & time: 08/07/23  0808      History   Chief Complaint Chief Complaint  Patient presents with   URI   Otalgia    HPI Micheal Roth is a 47 y.o. male.   Patient here today for evaluation of congestion, cough, runny nose, right ear pain and mild shortness of breath.  He reports that upper respiratory symptoms started about 4 days ago however shortness of breath seems to occur more this morning.  He has not had any fever.  He denies any vomiting or diarrhea.  The history is provided by the patient.  URI Presenting symptoms: congestion, cough, ear pain and sore throat   Presenting symptoms: no fever   Associated symptoms: wheezing   Otalgia Associated symptoms: congestion, cough and sore throat   Associated symptoms: no abdominal pain, no fever and no vomiting     Past Medical History:  Diagnosis Date   Arthritis    Chronic back pain    Chronic left shoulder pain    Chronic neck pain    Depression    GERD (gastroesophageal reflux disease)    h/o TOBACCO USER 05/10/2009   Qualifier: Diagnosis of   By: [employer]      Quit smoking cig 2 yrs ago - smoked for 20 yrs at 1.5ppd at most.   30 yr hx.   Still Smokes marijuana daily --> for 30-40 yrs. "     History of shingles    Migraines    Nausea and vomiting 04/09/2017   Paresthesia of left arm and leg    and weakness of left arm and leg   Seizures (HCC)    not in years    Patient Active Problem List   Diagnosis Date Noted   MDD (major depressive disorder), recurrent episode, severe (HCC) 05/21/2023   GAD (generalized anxiety disorder) 05/21/2023   History of arthroscopy of knee 08/17/2022   Encounter for orthopedic follow-up care 05/11/2022   Tear of lateral meniscus of knee 05/05/2022   Effusion of right knee joint 04/24/2022   Loose body in knee, right 04/24/2022   Pain in joint of right knee 06/14/2021   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   Ocular pain,  right eye-  actually periorbital 01/23/2018   Exertional epigastric/chest pain 05/03/2017   H/O: substance abuse (HCC) 05/03/2017   Nausea and vomiting 04/09/2017   Elevated triglycerides with high cholesterol 03/22/2017   Low serum HDL 03/22/2017   Myalgia 03/22/2017   Obesity, Class I, BMI 30-34.9 02/22/2017   Drug abuse, smokes marijuana qd 02/22/2017   Chronic heartburn 02/22/2017   Epigastric abdominal pain 02/22/2017   h/o Seizures (HCC) 04/26/2016   History of many Head concussions 04/26/2016   h/o Chronic neck pain 04/26/2016   History of shingles 04/26/2016   Neuralgia, postherpetic- R L Ext 04/26/2016   Edema of right lower extremity 04/26/2016   h/o Weakness of left arm 07/14/2013   h/o Paresthesia of left arm 07/14/2013   Cervical pain (neck) 07/14/2013   h/o Neck pain on left side 03/17/2013   h/o Pain in joint, R shoulder region 03/17/2013   Acute confusional state 12/18/2012   h/o Migraine 02/15/2009   h/o chronic LOW BACK PAIN 07/10/2008   Adjustment disorder with mixed anxiety and depressed mood 07/04/2007    Past Surgical History:  Procedure Laterality Date   BACK SURGERY     KNEE ARTHROSCOPY WITH LATERAL MENISECTOMY Right 05/18/2022  Procedure: KNEE ARTHROSCOPY WITH CHONDROPLASTY  AND DEBRIDEMENT;  Surgeon: Yolonda Kida, MD;  Location: [facility];  Service: Orthopedics;  Laterality: Right;  62   KNEE SURGERY     TUMOR REMOVAL Bilateral 1980   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 Authorizing Provider  albuterol (VENTOLIN HFA) 108 (90 Base) MCG/ACT inhaler Inhale 2 puffs into the lungs every 4 (four) hours as needed for wheezing or shortness of breath. Last used: 0715-720 am today 07/22/23  Yes [provider]  amoxicillin-clavulanate (AUGMENTIN) 875-125 MG tablet Take 1 tablet by mouth every 12 (twelve) hours. 08/07/23  Yes Tomi Bamberger, PA-C  escitalopram (LEXAPRO) 20 MG tablet Take 1 tablet (20 mg total) by mouth daily. 07/10/23 09/08/23 Yes Arfeen, Phillips Grout, MD  HYDROcodone-acetaminophen (NORCO) 7.5-325 MG tablet Take 1 tablet by mouth every 6 (six) hours as needed. 07/19/23  Yes [provider]  hydrOXYzine (ATARAX) 50 MG tablet Take 1-2 tablets (50-100 mg total) by mouth at bedtime as needed and may repeat dose one time if needed for anxiety. 07/10/23  Yes Arfeen, Phillips Grout, MD  ibuprofen (ADVIL) 200 MG tablet Take 200 mg by mouth every 6 (six) hours as needed. 10/10/13  Yes [provider]  omeprazole (PRILOSEC OTC) 20 MG tablet Take 20 mg by mouth daily before breakfast.   Yes [provider]  predniSONE (DELTASONE) 20 MG tablet Take 2 tablets (40 mg total) by mouth daily with breakfast for 5 days. 08/07/23 08/12/23 Yes Tomi Bamberger, PA-C  topiramate (TOPAMAX) 100 MG tablet Take 100 mg by mouth daily. 07/14/13  Yes [provider]  acetaminophen (TYLENOL) 500 MG tablet Take 500 mg by mouth every 6 (six) hours as needed. 10/10/13   [provider]  naproxen (NAPROSYN) 500 MG tablet Take 500 mg by mouth 2 (two) times daily with a meal.    [provider]  ondansetron (ZOFRAN-ODT) 4 MG disintegrating tablet Take 1 tablet (4 mg total) by mouth every 8 (eight) hours as needed for nausea or vomiting. 05/01/23   Domenick Gong, MD  traMADol (ULTRAM) 50 MG tablet Take 50 mg by mouth every 6 (six) hours as needed.    [provider]  traZODone (DESYREL) 50 MG tablet Take 1-2 tablets (50-100 mg total) by mouth at bedtime as needed for sleep. One week of 50 mg nightly as needed, then may increase to 50-100 mg nightly as needed for insomnia Patient not taking: Reported on 07/10/2023 06/06/23 08/05/23  Lamar Sprinkles, MD    Family History Family History  Problem Relation Age of Onset   Diabetes Father    Diabetes Maternal Grandmother    Heart disease Maternal Grandmother         CHF   Heart attack Maternal Grandmother    Crohn's disease Brother    Colon cancer Neg Hx    Colon polyps Neg Hx    Esophageal cancer Neg Hx    Stomach cancer Neg Hx    Rectal cancer Neg Hx     Social History Social History   Tobacco Use   Smoking status: Every Day    Current packs/day: 0.00    Average packs/day: 1 pack/day for 28.3 years (28.3 ttl pk-yrs)    Types: Cigarettes    Start date: 4    Last attempt to quit: 01/02/2022  Years since quitting: 1.5   Smokeless tobacco: Former  Building services engineer status: Former   Substances: Nicotine  Substance Use Topics   Alcohol use: Yes    Comment: Occassionally.   Drug use: Not Currently    Frequency: 2.0 times per week    Types: Marijuana     Allergies   Patient has no known allergies.   Review of Systems Review of Systems  Constitutional:  Negative for chills and fever.  HENT:  Positive for congestion, ear pain and sore throat.   Eyes:  Negative for discharge and redness.  Respiratory:  Positive for cough, shortness of breath and wheezing.   Gastrointestinal:  Negative for abdominal pain, nausea and vomiting.     Physical Exam Triage Vital Signs ED Triage Vitals  Encounter Vitals Group     BP --      Systolic BP Percentile --      Diastolic BP Percentile --      Pulse --      Resp --      Temp --      Temp src --      SpO2 --      Weight 08/07/23 0908 250 lb (113.4 kg)     Height 08/07/23 0908 6' (1.829 m)     Head Circumference --      Peak Flow --      Pain Score 08/07/23 0906 4     Pain Loc --      Pain Education --      Exclude from Growth Chart --    No data found.  Updated Vital Signs BP (!) 156/79 (BP Location: Right Arm) Comment: Patient aware, PCP aware of recent abnormals, upcoming appt with PCP. Will recheck today after appt if needed.  Pulse 76   Temp 98.6 F (37 C) (Oral)   Resp (!) 22   Ht 6' (1.829 m)   Wt 250 lb (113.4 kg)   SpO2 97%   BMI 33.91 kg/m   Physical  Exam Vitals and nursing note reviewed.  Constitutional:      General: He is not in acute distress.    Appearance: Normal appearance. He is not ill-appearing.  HENT:     Head: Normocephalic and atraumatic.     Ears:     Comments: Bilateral TMs dull    Nose: Congestion present.     Mouth/Throat:     Mouth: Mucous membranes are moist.     Pharynx: Oropharynx is clear. No oropharyngeal exudate or posterior oropharyngeal erythema.  Eyes:     Conjunctiva/sclera: Conjunctivae normal.  Cardiovascular:     Rate and Rhythm: Normal rate and regular rhythm.     Heart sounds: Normal heart sounds. No murmur heard. Pulmonary:     Effort: Pulmonary effort is normal. No respiratory distress.     Breath sounds: Normal breath sounds. No wheezing, rhonchi or rales.  Skin:    General: Skin is warm and dry.  Neurological:     Mental Status: He is alert.  Psychiatric:        Mood and Affect: Mood normal.        Thought Content: Thought content normal.      UC Treatments / Results  Labs (all labs ordered are listed, but only abnormal results are displayed) Labs Reviewed - No data to display  EKG   Radiology No results found.  Procedures Procedures (including critical care time)  Medications Ordered in UC Medications - No  data to display  Initial Impression / Assessment and Plan / UC Course  I have reviewed the triage vital signs and the nursing notes.  Pertinent labs & imaging results that were available during my care of the patient were reviewed by me and considered in my medical decision making (see chart for details).    Suspect sinobronchitis and will treat with both Augmentin and steroid burst.  Recommended follow-up if no gradual improvement with same or with any further concerns.  Patient expresses understanding.  Final Clinical Impressions(s) / UC Diagnoses   Final diagnoses:  Sinobronchitis   Discharge Instructions   None    ED Prescriptions     Medication Sig  Dispense Auth. Provider   amoxicillin-clavulanate (AUGMENTIN) 875-125 MG tablet Take 1 tablet by mouth every 12 (twelve) hours. 14 tablet Erma Pinto F, PA-C   predniSONE (DELTASONE) 20 MG tablet Take 2 tablets (40 mg total) by mouth daily with breakfast for 5 days. 10 tablet Tomi Bamberger, PA-C      PDMP not reviewed this encounter.   Tomi Bamberger, PA-C 08/07/23 1011

## 2023-08-07 NOTE — ED Triage Notes (Signed)
"  I have been having trouble breathing this morning, my right ear is hurting this morning, the cold symptoms (congestion, cough, runny nose) started Friday". No fever known.

## 2023-08-10 ENCOUNTER — Ambulatory Visit (HOSPITAL_COMMUNITY): Payer: 59 | Admitting: Clinical

## 2023-08-13 ENCOUNTER — Ambulatory Visit (INDEPENDENT_AMBULATORY_CARE_PROVIDER_SITE_OTHER): Payer: 59 | Admitting: Licensed Clinical Social Worker

## 2023-08-13 DIAGNOSIS — F332 Major depressive disorder, recurrent severe without psychotic features: Secondary | ICD-10-CM | POA: Diagnosis not present

## 2023-08-13 DIAGNOSIS — F411 Generalized anxiety disorder: Secondary | ICD-10-CM

## 2023-08-13 NOTE — Progress Notes (Signed)
THERAPIST PROGRESS NOTE   Session Date: 08/13/2023  Session Time: 1008 - 1051  Participation Level: Active  Behavioral Response: Neat and Well GroomedAlertEuthymic  Type of Therapy: Individual Therapy  Treatment Goals addressed:  - LTG: "Try to make sure I can deal with the depression as it comes" (OP Depression) - LTG: Reduce frequency, intensity, and duration of depression symptoms so that daily functioning is improved (OP Depression) - LTG: Increase coping skills to manage depression and improve ability to perform daily activities (OP Depression) - STG: Reduce overall depression score by a minimum of 25% on the Patient Health Questionnaire (PHQ-9) (OP Depression) - STG: [name] will identify cognitive patterns and beliefs that support depression (OP Depression) - STG: [name] will reduce frequency of avoidant behaviors by 50% as evidenced by self-report in therapy sessions (Anxiety) - STG: Report a decrease in anxiety symptoms as evidenced by an overall reduction in anxiety score by a minimum of 25% on the Generalized Anxiety Disorder Scale (GAD-7) (Anxiety) - LTG: "Learn how to deal with it when it comes and find what triggers my anxiety" (Anxiety)  ProgressTowards Goals: Progressing  Interventions: CBT, Motivational Interviewing, and Strength-based  Summary: Micheal Roth is a 47 y.o. male with past psych history of MDD and GAD, presenting for follow-up therapy session in efforts to improve management of depressive and anxious symptoms. Patient actively engaged in session, sharing of having experienced challenges over the past week with his physical health including patient's knee, making it challenging for patient to walk, and having bronchitis last week, resulting in dose of antibiotics and in turn creating further complications for patient's GI health.  Actively engaged in exploring events of the past week, processing patient's overall feelings and reduction in depressive and  anxious symptoms.  Readministered PHQ-9, further engaging in processing continued downward trend in scaling of severity of symptoms.  Explored patient's thoughts and feelings surrounding history, efforts/commitment towards treatment thus far, and understanding and awareness of the potential for regression. Patient responded well to interventions. Patient continues to meet criteria for MDD and GAD . Patient will continue to benefit from engagement in outpatient therapy due to being the least restrictive service to meet presenting needs.      08/13/2023   10:27 AM 07/31/2023   10:16 AM 07/16/2023    3:17 PM 07/10/2023    1:39 PM 06/19/2023    9:36 AM  Depression screen PHQ 2/9  Decreased Interest 0 1 1 1 2   Down, Depressed, Hopeless 1 1 1 1 2   PHQ - 2 Score 1 2 2 2 4   Altered sleeping 0 0 1 1 3   Tired, decreased energy 0 2 1 1 2   Change in appetite 0 1 0 1 2  Feeling bad or failure about yourself  1 1 1  0 2  Trouble concentrating 0 1 0 2 2  Moving slowly or fidgety/restless 0 0 0 1 1  Suicidal thoughts 1 1 1  0 1  PHQ-9 Score 3 8 6 8 17   Difficult doing work/chores Not difficult at all Somewhat difficult Somewhat difficult Somewhat difficult Extremely dIfficult   Flowsheet Row Counselor from 08/13/2023 in [facility] [facility] ED from 08/07/2023 in [facility]) Counselor from 07/31/2023 in [place] [facility] at [facility] RISK CATEGORY Low Risk No Risk Low Risk         08/13/2023   10:19 AM 07/31/2023   10:12 AM 07/16/2023    3:14 PM  05/01/2023    3:00 PM  GAD 7 : Generalized Anxiety Score  Nervous, Anxious, on Edge 1 1 1 3   Control/stop worrying 1 1 0 3  Worry too much - different things 0 0 0 3  Trouble relaxing 0 0 1 3  Restless 0 1 3 3   Easily annoyed or irritable 1 1 1 1   Afraid - awful might happen 0 0 0 1  Total GAD 7 Score 3 4 6 17   Anxiety Difficulty Not difficult at all  Not difficult at all Somewhat difficult    Suicidal/Homicidal: No  Therapist Response: Clinician utilized CBT, MI, and strengths based techniques to support pt in navigating reported sxs and stressors of the past week. Actively engaged patient in discussion of past week, further eliciting recounts of events and recent challenges/stressors.  Supported patient in reflecting on continued efforts at managing depressive and anxious symptoms over the past week, and processing patient's identified supportive factors.  Readministered PHQ-9 and GAD 7, engaging patient in discussion surrounding continued downward trends in scaling of severity of symptoms. Therapist provided support and empathy to patient during session.  Plan: Return again in 1 weeks.  Diagnosis:  Encounter Diagnoses  Name Primary?   Severe episode of recurrent major depressive disorder, without psychotic features (HCC) Yes   GAD (generalized anxiety disorder)       Collaboration of Care: Other None necessary at this time.  Patient/Guardian was advised Release of Information must be obtained prior to any record release in order to collaborate their care with an outside provider. Patient/Guardian was advised if they have not already done so to contact the registration department to sign all necessary forms in order for [place] to release information regarding their care.   Consent: Patient/Guardian gives verbal consent for treatment and assignment of benefits for services provided during this visit. Patient/Guardian expressed understanding and agreed to proceed.   Leisa Lenz, MSW, LCSW 08/13/2023,  10:29 AM

## 2023-08-20 ENCOUNTER — Ambulatory Visit (INDEPENDENT_AMBULATORY_CARE_PROVIDER_SITE_OTHER): Payer: 59 | Admitting: Licensed Clinical Social Worker

## 2023-08-20 DIAGNOSIS — F332 Major depressive disorder, recurrent severe without psychotic features: Secondary | ICD-10-CM

## 2023-08-20 DIAGNOSIS — F411 Generalized anxiety disorder: Secondary | ICD-10-CM

## 2023-08-20 NOTE — Progress Notes (Signed)
THERAPIST PROGRESS NOTE   Session Date: 08/20/2023  Session Time: 1002 - 1102  Participation Level: Active  Behavioral Response: Neat and Well GroomedAlertEuthymic  Type of Therapy: Individual Therapy  Treatment Goals addressed:  - LTG: "Try to make sure I can deal with the depression as it comes" (OP Depression) - LTG: Reduce frequency, intensity, and duration of depression symptoms so that daily functioning is improved (OP Depression) - LTG: Increase coping skills to manage depression and improve ability to perform daily activities (OP Depression) - STG: Reduce overall depression score by a minimum of 25% on the Patient Health Questionnaire (PHQ-9) (OP Depression) - STG: Donovyn will identify cognitive patterns and beliefs that support depression (OP Depression) - STG: Alim will reduce frequency of avoidant behaviors by 50% as evidenced by self-report in therapy sessions (Anxiety) - STG: Report a decrease in anxiety symptoms as evidenced by an overall reduction in anxiety score by a minimum of 25% on the Generalized Anxiety Disorder Scale (GAD-7) (Anxiety) - LTG: "Learn how to deal with it when it comes and find what triggers my anxiety" (Anxiety)  ProgressTowards Goals: Progressing  Interventions: CBT, Motivational Interviewing, and Strength-based  Summary: Micheal Roth is a 47 y.o. male with past psych history of MDD and GAD, presenting for follow-up therapy session in efforts to improve management of depressive and anxious symptoms. Patient actively engaged in session, detailing recent events over the past week, sharing of having spent time with partner engaging in various activities surrounding planning for upcoming holiday, participating in review at workplace, and recent PCP visit.  Patient further expounded on details surrounding recent PCP visit, sharing of identified medical concerns, further evaluation, and the implications that medical current concerns have proven to have  on daily functioning, further engaging in exploration of any variances to depressive and anxious symptoms related to medical concerns.  Further engaged in exploration of family history and relationships with current partner, children, and partner, processing hx of behaviors and implications on relationships. Began exploration of anger as an emotion, societal factors surrounding anger, and challenges expressing alternate emotions. Patient responded well to interventions. Patient continues to meet criteria for MDD and GAD . Patient will continue to benefit from engagement in outpatient therapy due to being the least restrictive service to meet presenting needs.      08/13/2023   10:27 AM 07/31/2023   10:16 AM 07/16/2023    3:17 PM 07/10/2023    1:39 PM 06/19/2023    9:36 AM  Depression screen PHQ 2/9  Decreased Interest 0 1 1 1 2   Down, Depressed, Hopeless 1 1 1 1 2   PHQ - 2 Score 1 2 2 2 4   Altered sleeping 0 0 1 1 3   Tired, decreased energy 0 2 1 1 2   Change in appetite 0 1 0 1 2  Feeling bad or failure about yourself  1 1 1  0 2  Trouble concentrating 0 1 0 2 2  Moving slowly or fidgety/restless 0 0 0 1 1  Suicidal thoughts 1 1 1  0 1  PHQ-9 Score 3 8 6 8 17   Difficult doing work/chores Not difficult at all Somewhat difficult Somewhat difficult Somewhat difficult Extremely dIfficult   Flowsheet Row Counselor from 08/13/2023 in [place] [facility] ED from 08/07/2023 in [facility] Counselor from 07/31/2023 in [facility] at [facility] RISK CATEGORY Low Risk No Risk Low Risk  08/13/2023   10:19 AM 07/31/2023   10:12 AM 07/16/2023    3:14 PM 05/01/2023    3:00 PM  GAD 7 : Generalized Anxiety Score  Nervous, Anxious, on Edge 1 1 1 3   Control/stop worrying 1 1 0 3  Worry too much - different things 0 0 0 3  Trouble relaxing 0 0 1 3  Restless 0 1 3 3   Easily annoyed or  irritable 1 1 1 1   Afraid - awful might happen 0 0 0 1  Total GAD 7 Score 3 4 6 17   Anxiety Difficulty Not difficult at all Not difficult at all Somewhat difficult    Suicidal/Homicidal: No  Therapist Response: Clinician utilized CBT, MI, and strengths based techniques to support pt in navigating reported sxs and stressors of the past week. Actively engaged patient in exploring recent weekend events, eliciting details of the past week and any areas in which patient proves able to identify factors proving to impact of depressive and anxious symptoms.  Further explored relationship with oldest child, exploring history of relationship over the past 20+ years, and the ways in which relationship is varied.  Begin exploring anger as an emotion, providing patient with anger iceberg worksheet, encouraging patient to begin tracking awareness of anger and underlying feelings when observing self becoming angry over the coming week.  Briefly discussed societal factors leading to them in primarily responding through means of anger, and the importance of efforts to implement to address such concerns. Therapist provided support and empathy to patient during session.  Plan: Return again in 1 weeks.  Diagnosis:  Encounter Diagnoses  Name Primary?   Severe episode of recurrent major depressive disorder, without psychotic features (HCC) Yes   GAD (generalized anxiety disorder)        Collaboration of Care: Other None necessary at this time.  Patient/Guardian was advised Release of Information must be obtained prior to any record release in order to collaborate their care with an outside provider. Patient/Guardian was advised if they have not already done so to contact the registration department to sign all necessary forms in order for [place] to release information regarding their care.   Consent: Patient/Guardian gives verbal consent for treatment and assignment of benefits for services provided during this  visit. Patient/Guardian expressed understanding and agreed to proceed.   Leisa Lenz, MSW, LCSW 08/20/2023,  11:05 AM

## 2023-08-24 ENCOUNTER — Telehealth (HOSPITAL_COMMUNITY): Payer: 59 | Admitting: Psychiatry

## 2023-08-24 ENCOUNTER — Other Ambulatory Visit (HOSPITAL_COMMUNITY): Payer: Self-pay | Admitting: Psychiatry

## 2023-08-24 DIAGNOSIS — F331 Major depressive disorder, recurrent, moderate: Secondary | ICD-10-CM

## 2023-08-24 DIAGNOSIS — F4312 Post-traumatic stress disorder, chronic: Secondary | ICD-10-CM

## 2023-08-28 ENCOUNTER — Ambulatory Visit (INDEPENDENT_AMBULATORY_CARE_PROVIDER_SITE_OTHER): Payer: 59 | Admitting: Licensed Clinical Social Worker

## 2023-08-28 DIAGNOSIS — F411 Generalized anxiety disorder: Secondary | ICD-10-CM

## 2023-08-28 DIAGNOSIS — F332 Major depressive disorder, recurrent severe without psychotic features: Secondary | ICD-10-CM | POA: Diagnosis not present

## 2023-08-28 NOTE — Progress Notes (Unsigned)
THERAPIST PROGRESS NOTE   Session Date: 08/28/2023  Session Time: 1000 - 1053 Virtual Visit via Video Note  I connected with Micheal Roth on 08/28/23 at 10:00 AM EST by a video enabled telemedicine application and verified that I am speaking with the correct person using two identifiers.  Location: Patient: Home Provider: Home office   The patient was advised to call back or seek an in-person evaluation if the symptoms worsen or if the condition fails to improve as anticipated.  I provided 53 minutes of non-face-to-face time during this encounter.  Participation Level: Active  Behavioral Response: Neat and Well GroomedAlertEuthymic  Type of Therapy: Individual Therapy  Treatment Goals addressed:  - LTG: "Try to make sure I can deal with the depression as it comes" (OP Depression) - LTG: Reduce frequency, intensity, and duration of depression symptoms so that daily functioning is improved (OP Depression) - LTG: Increase coping skills to manage depression and improve ability to perform daily activities (OP Depression) - STG: Reduce overall depression score by a minimum of 25% on the Patient Health Questionnaire (PHQ-9) (OP Depression) - STG: Micheal Roth will identify cognitive patterns and beliefs that support depression (OP Depression) - STG: Micheal Roth will reduce frequency of avoidant behaviors by 50% as evidenced by self-report in therapy sessions (Anxiety) - STG: Report a decrease in anxiety symptoms as evidenced by an overall reduction in anxiety score by a minimum of 25% on the Generalized Anxiety Disorder Scale (GAD-7) (Anxiety) - LTG: "Learn how to deal with it when it comes and find what triggers my anxiety" (Anxiety)  ProgressTowards Goals: Progressing  Interventions: CBT, Motivational Interviewing, and Strength-based  Summary: Micheal Roth is a 47 y.o. male with past psych history of MDD and GAD, presenting for follow-up therapy session in efforts to improve  management of depressive and anxious symptoms. Patient actively engaged in session, maintaining pleasant moods throughout today's session.  Patient actively detailed recent recounts of the past week, sharing of increased stress surrounding car troubles earlier in the week, however sharing of abilities to reframe thoughts and perspectives to be more positive.  Actively engaged in processing relationship with a lifelong peer, and recent identified challenges within relationship.  Actively engaged in readminister ring of PHQ-9 and GAD-7, reflecting on continued downward trend in severity of depressive and anxious symptoms.  Explored CBT triangle, and the relationship between thoughts, feelings, and behaviors, and how each component impacts the other.  Briefly explored the ways in which our moods prove to impact her perspectives, and in [place] impact thoughts, feelings, and behaviors. Patient responded well to interventions. Patient continues to meet criteria for MDD and GAD . Patient will continue to benefit from engagement in outpatient therapy due to being the least restrictive service to meet presenting needs.      08/28/2023   10:28 AM 08/13/2023   10:27 AM 07/31/2023   10:16 AM 07/16/2023    3:17 PM 07/10/2023    1:39 PM  Depression screen PHQ 2/9  Decreased Interest 0 0 1 1 1   Down, Depressed, Hopeless 0 1 1 1 1   PHQ - 2 Score 0 1 2 2 2   Altered sleeping 0 0 0 1 1  Tired, decreased energy 1 0 2 1 1   Change in appetite 0 0 1 0 1  Feeling bad or failure about yourself  1 1 1 1  0  Trouble concentrating 0 0 1 0 2  Moving slowly or fidgety/restless 0 0 0 0 1  Suicidal thoughts 0 1 1  1 0  PHQ-9 Score 2 3 8 6 8   Difficult doing work/chores Not difficult at all Not difficult at all Somewhat difficult Somewhat difficult Somewhat difficult   Flowsheet Row Counselor from 08/13/2023 in [place] [facility] ED from 08/07/2023 in [facility] at [facility] Counselor from 07/31/2023 in [facility] at [facility] RISK CATEGORY Low Risk No Risk Low Risk         08/28/2023   10:21 AM 08/13/2023   10:19 AM 07/31/2023   10:12 AM 07/16/2023    3:14 PM  GAD 7 : Generalized Anxiety Score  Nervous, Anxious, on Edge 1 1 1 1   Control/stop worrying 0 1 1 0  Worry too much - different things 0 0 0 0  Trouble relaxing 0 0 0 1  Restless 0 0 1 3  Easily annoyed or irritable 1 1 1 1   Afraid - awful might happen 0 0 0 0  Total GAD 7 Score 2 3 4 6   Anxiety Difficulty Not difficult at all Not difficult at all Not difficult at all Somewhat difficult   Suicidal/Homicidal: No  Therapist Response: Clinician utilized CBT, MI, and strengths based techniques to support pt in navigating reported sxs and stressors of the past week. Actively engaged patient in reflection of events throughout the past week, exploring instances of increased stressors and challenges, and the observed impact events had on his moods. Actively elicited pt's perspectives on observations of variances in moods throughout the past week, identifying pt's efforts at focusing on points of gratitude and maintaining positive mindsets. Revisited pt's hx of peer relationship and the challenges encountered with peer, further exploring societal factors and expectations for masculinity. Therapist provided support and empathy to patient during session.  Plan: Return again in 1 weeks.  Diagnosis:  Encounter Diagnoses  Name Primary?   Severe episode of recurrent major depressive disorder, without psychotic features (HCC) Yes   GAD (generalized anxiety disorder)    Collaboration of Care: Other None necessary at this time.  Patient/Guardian was advised Release of Information must be obtained prior to any record release in order to collaborate their care with an outside provider. Patient/Guardian was advised if they have not already done so to contact the  registration department to sign all necessary forms in order for [place] to release information regarding their care.   Consent: Patient/Guardian gives verbal consent for treatment and assignment of benefits for services provided during this visit. Patient/Guardian expressed understanding and agreed to proceed.   Leisa Lenz, MSW, LCSW 08/28/2023,  10:31 AM

## 2023-09-10 ENCOUNTER — Ambulatory Visit (HOSPITAL_COMMUNITY): Payer: 59 | Admitting: Licensed Clinical Social Worker

## 2023-09-10 DIAGNOSIS — F332 Major depressive disorder, recurrent severe without psychotic features: Secondary | ICD-10-CM

## 2023-09-10 DIAGNOSIS — F411 Generalized anxiety disorder: Secondary | ICD-10-CM

## 2023-09-10 NOTE — Progress Notes (Signed)
 THERAPIST PROGRESS NOTE   Session Date: 09/10/2023  Session Time: 1608 - 1658  Virtual Visit via Video Note  I connected with Micheal Roth on 09/10/23 at  4:00 PM EST by a video enabled telemedicine application and verified that I am speaking with the correct person using two identifiers.  Location: Patient: Home Provider: Home office  The patient was advised to call back or seek an in-person evaluation if the symptoms worsen or if the condition fails to improve as anticipated.  I provided 50 minutes of non-face-to-face time during this encounter.  Participation Level: Active  Behavioral Response: CasualAlertAnxious and Depressed  Type of Therapy: Individual Therapy  Treatment Goals addressed:  - LTG: Try to make sure I can deal with the depression as it comes (OP Depression) - LTG: Reduce frequency, intensity, and duration of depression symptoms so that daily functioning is improved (OP Depression) - LTG: Increase coping skills to manage depression and improve ability to perform daily activities (OP Depression) - STG: Reduce overall depression score by a minimum of 25% on the Patient Health Questionnaire (PHQ-9) (OP Depression) - STG: Micheal Roth will identify cognitive patterns and beliefs that support depression (OP Depression) - STG: Micheal Roth will reduce frequency of avoidant behaviors by 50% as evidenced by self-report in therapy sessions (Anxiety) - STG: Report a decrease in anxiety symptoms as evidenced by an overall reduction in anxiety score by a minimum of 25% on the Generalized Anxiety Disorder Scale (GAD-7) (Anxiety) - LTG: Learn how to deal with it when it comes and find what triggers my anxiety (Anxiety)  ProgressTowards Goals: Progressing  Interventions: CBT, Motivational Interviewing, and Strength-based  Summary: Micheal Roth is a 48 y.o. male with past psych history of MDD and GAD, presenting for follow-up therapy session in efforts to improve management  of depressive and anxious symptoms. Patient actively engaged in session, maintaining pleasant moods throughout today's session. Pt actively engaged in completion of PHQ9 and GAD7, reporting of a noticeable increase in depressive and anxious sxs over the past two weeks since previous session, sharing of challenges surrounding recent respiratory illness, and finalizing move from long-time family home where pt had been residing, to relocate temporarily to parents home. Further processed thoughts and feelings surrounding increase in both depressive and anxious sxs, with pt identifying root cause of increase being that of moving from residence that had originally been pt's great-grandmothers and in the family for generations and finally being sold, resulting in pt having to relocate. Pt acknowledged behaviors that he had engaged in over the past month approaching move date to be maladaptive and resulting in increasing stress at the time of needing to move. Engaged pt in Challenging Negative Thoughts activity, proving to explore pt's understanding of the impact thoughts and perspectives have on individuals feelings, further impacting behaviors, and moods.  Patient responded well to interventions. Patient continues to meet criteria for MDD and GAD . Patient will continue to benefit from engagement in outpatient therapy due to being the least restrictive service to meet presenting needs.      09/10/2023    4:18 PM 08/28/2023   10:28 AM 08/13/2023   10:27 AM 07/31/2023   10:16 AM 07/16/2023    3:17 PM  Depression screen PHQ 2/9  Decreased Interest 1 0 0 1 1  Down, Depressed, Hopeless 2 0 1 1 1   PHQ - 2 Score 3 0 1 2 2   Altered sleeping 1 0 0 0 1  Tired, decreased energy 0 1 0 2 1  Change in appetite 0 0 0 1 0  Feeling bad or failure about yourself  2 1 1 1 1   Trouble concentrating 0 0 0 1 0  Moving slowly or fidgety/restless 0 0 0 0 0  Suicidal thoughts 1 0 1 1 1   PHQ-9 Score 7 2 3 8 6   Difficult doing  work/chores Not difficult at all Not difficult at all Not difficult at all Somewhat difficult Somewhat difficult   Flowsheet Row Counselor from 08/13/2023 in [facility] from 08/07/2023 in [facility]) Counselor from 07/31/2023 in [facility] RISK CATEGORY Low Risk No Risk Low Risk         09/10/2023    4:16 PM 08/28/2023   10:21 AM 08/13/2023   10:19 AM 07/31/2023   10:12 AM  GAD 7 : Generalized Anxiety Score  Nervous, Anxious, on Edge 2 1 1 1   Control/stop worrying 0 0 1 1  Worry too much - different things 1 0 0 0  Trouble relaxing 1 0 0 0  Restless 0 0 0 1  Easily annoyed or irritable 1 1 1 1   Afraid - awful might happen 0 0 0 0  Total GAD 7 Score 5 2 3 4   Anxiety Difficulty Not difficult at all Not difficult at all Not difficult at all Not difficult at all   Suicidal/Homicidal: No  Therapist Response: Clinician utilized CBT, MI, and strengths based techniques to support pt in navigating reported sxs and stressors of the past week. Actively engaged patient in reflection of events over the past 2 weeks. Re-administered depression and anxiety screenings, engaging pt in reflection and processing of pt's observed increase in severity and frequency of sxs. Evoked pt's thoughts surrounding recent challenging events and stressors, further eliciting pt's recounts and perspectives on how pt proved to behave in relation to such thoughts and feelings. Introduced pt to Challenging Negative Thoughts activity, engaging pt in exploring prompts in relation to automatic negative thoughts, and the way challenging thoughts aids in improving perspectives, emotions, and moods.  Therapist provided support and empathy to patient during session.  Plan: Return again in 3 weeks.  Diagnosis:  Encounter Diagnoses  Name Primary?   Severe episode of recurrent major  depressive disorder, without psychotic features (HCC) Yes   GAD (generalized anxiety disorder)     Collaboration of Care: Other None necessary at this time.  Patient/Guardian was advised Release of Information must be obtained prior to any record release in order to collaborate their care with an outside provider. Patient/Guardian was advised if they have not already done so to contact the registration department to sign all necessary forms in order for us  to release information regarding their care.   Consent: Patient/Guardian gives verbal consent for treatment and assignment of benefits for services provided during this visit. Patient/Guardian expressed understanding and agreed to proceed.   Micheal Roth, MSW, LCSW 09/10/2023,  4:22 PM

## 2023-09-16 ENCOUNTER — Encounter: Payer: Self-pay | Admitting: *Deleted

## 2023-09-16 ENCOUNTER — Other Ambulatory Visit: Payer: Self-pay

## 2023-09-16 ENCOUNTER — Ambulatory Visit
Admission: EM | Admit: 2023-09-16 | Discharge: 2023-09-16 | Disposition: A | Discharge status: Home / Self Care | Payer: 59 | Attending: Physician Assistant | Admitting: Physician Assistant

## 2023-09-16 ENCOUNTER — Ambulatory Visit (INDEPENDENT_AMBULATORY_CARE_PROVIDER_SITE_OTHER): Payer: 59

## 2023-09-16 DIAGNOSIS — R051 Acute cough: Secondary | ICD-10-CM | POA: Diagnosis not present

## 2023-09-16 DIAGNOSIS — J209 Acute bronchitis, unspecified: Secondary | ICD-10-CM

## 2023-09-16 MED ORDER — PROMETHAZINE-DM 6.25-15 MG/5ML PO SYRP
5.0000 mL | ORAL_SOLUTION | Freq: Four times a day (QID) | ORAL | 0 refills | Status: DC | PRN
Start: 2023-09-16 — End: 2023-10-23

## 2023-09-16 MED ORDER — AZITHROMYCIN 250 MG PO TABS: ORAL_TABLET | ORAL | 0 refills | Status: DC

## 2023-09-16 NOTE — ED Triage Notes (Signed)
 Cough since Tuesday, increased SOB. His PCP started him on prednisone, tessalon, and albuterol inhaler for bronchitis. States he just got over c-diff so was trying to avoid antibiotics. States he his having increased SOB today with pain in left ribs

## 2023-09-16 NOTE — Discharge Instructions (Signed)
 Continue prednisone  Recommend Mucinex and Flonase for congestion  Hold antibiotic at this time, can initiate if no improvement Take cough syrup as needed.

## 2023-09-16 NOTE — ED Provider Notes (Signed)
 [facility]    CSN: [id number] Arrival date & time: 09/16/23  0827      History   Chief Complaint Chief Complaint  Patient presents with   Shortness of Breath    HPI Micheal Roth is a 48 y.o. male.   Pt complains of cough and congestion that started 6 days ago.  He complains of wheezing.  Denies h/o asthma or lung disease.  Reports trouble with his breathing since having COVID last year.  Seen by PCP on Tuesday and started with 12 days of prednisone , albuterol  inhaler and tessalon .  Reports minimal improvement with these meds.  He denies fever, chills, body aches.  Reports left sided rib pain that is worse with coughing.  Denies sick contacts.     Past Medical History:  Diagnosis Date   Arthritis    Chronic back pain    Chronic left shoulder pain    Chronic neck pain    Depression    GERD (gastroesophageal reflux disease)    h/o TOBACCO USER 05/10/2009   Qualifier: Diagnosis of   By: Manford Longs      Quit smoking cig 2 yrs ago - smoked for 20 yrs at 1.5ppd at most.   30 yr hx.   Still Smokes marijuana daily --> for 30-40 yrs.      History of shingles    Migraines    Nausea and vomiting 04/09/2017   Paresthesia of left arm and leg    and weakness of left arm and leg   Seizures (HCC)    not in years    Patient Active Problem List   Diagnosis Date Noted   MDD (major depressive disorder), recurrent episode, severe (HCC) 05/21/2023   GAD (generalized anxiety disorder) 05/21/2023   History of arthroscopy of knee 08/17/2022   Encounter for orthopedic follow-up care 05/11/2022   Tear of lateral meniscus of knee 05/05/2022   Effusion of right knee joint 04/24/2022   Loose body in knee, right 04/24/2022   Pain in joint of right knee 06/14/2021   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   Ocular pain, right eye-  actually periorbital 01/23/2018   Exertional epigastric/chest pain 05/03/2017   H/O: substance abuse (HCC) 05/03/2017   Nausea and vomiting 04/09/2017    Elevated triglycerides with high cholesterol 03/22/2017   Low serum HDL 03/22/2017   Myalgia 03/22/2017   Obesity, Class I, BMI 30-34.9 02/22/2017   Drug abuse, smokes marijuana qd 02/22/2017   Chronic heartburn 02/22/2017   Epigastric abdominal pain 02/22/2017   h/o Seizures (HCC) 04/26/2016   History of many Head concussions 04/26/2016   h/o Chronic neck pain 04/26/2016   History of shingles 04/26/2016   Neuralgia, postherpetic- R L Ext 04/26/2016   Edema of right lower extremity 04/26/2016   h/o Weakness of left arm 07/14/2013   h/o Paresthesia of left arm 07/14/2013   Cervical pain (neck) 07/14/2013   h/o Neck pain on left side 03/17/2013   h/o Pain in joint, R shoulder region 03/17/2013   Acute confusional state 12/18/2012   h/o Migraine 02/15/2009   h/o chronic LOW BACK PAIN 07/10/2008   Adjustment disorder with mixed anxiety and depressed mood 07/04/2007    Past Surgical History:  Procedure Laterality Date   BACK SURGERY     KNEE ARTHROSCOPY WITH LATERAL MENISECTOMY Right 05/18/2022   Procedure: KNEE ARTHROSCOPY WITH CHONDROPLASTY  AND DEBRIDEMENT;  Surgeon: Sharl Selinda Dover, MD;  Location: [facility];  Service: Orthopedics;  Laterality: Right;  62  KNEE SURGERY     TUMOR REMOVAL Bilateral 1980   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 Authorizing Provider  acetaminophen  (TYLENOL ) 500 MG tablet Take 500 mg by mouth every 6 (six) hours as needed. 10/10/13  Yes [provider]  albuterol  (VENTOLIN  HFA) 108 (90 Base) MCG/ACT inhaler Inhale 2 puffs into the lungs every 4 (four) hours as needed for wheezing or shortness of breath. Last used: 0715-720 am today 07/22/23  Yes [provider]  azithromycin  (ZITHROMAX  Z-PAK) 250 MG tablet Take 2 tablets by mouth on the first day, take 1 tablet by for remaining days. 09/16/23  Yes Ward, Harlene PEDLAR, PA-C   escitalopram  (LEXAPRO ) 20 MG tablet Take 1 tablet (20 mg total) by mouth daily. 07/10/23 09/16/23 Yes Arfeen, Leni DASEN, MD  hydrOXYzine  (ATARAX ) 50 MG tablet Take 1-2 tablets (50-100 mg total) by mouth at bedtime as needed and may repeat dose one time if needed for anxiety. 07/10/23  Yes Arfeen, Leni DASEN, MD  ibuprofen  (ADVIL ) 200 MG tablet Take 200 mg by mouth every 6 (six) hours as needed. 10/10/13  Yes [provider]  omeprazole  (PRILOSEC OTC) 20 MG tablet Take 20 mg by mouth daily before breakfast.   Yes [provider]  ondansetron  (ZOFRAN -ODT) 4 MG disintegrating tablet Take 1 tablet (4 mg total) by mouth every 8 (eight) hours as needed for nausea or vomiting. 05/01/23  Yes Van Knee, MD  promethazine -dextromethorphan (PROMETHAZINE -DM) 6.25-15 MG/5ML syrup Take 5 mLs by mouth 4 (four) times daily as needed for cough. 09/16/23  Yes Ward, Mariela Rex Z, PA-C  HYDROcodone -acetaminophen  (NORCO) 7.5-325 MG tablet Take 1 tablet by mouth every 6 (six) hours as needed. Patient not taking: Reported on 09/16/2023 07/19/23   [provider]  naproxen  (NAPROSYN ) 500 MG tablet Take 500 mg by mouth 2 (two) times daily with a meal. Patient not taking: Reported on 09/16/2023    [provider]  topiramate  (TOPAMAX ) 100 MG tablet Take 100 mg by mouth daily. Patient not taking: Reported on 09/16/2023 07/14/13   [provider]  traMADol  (ULTRAM ) 50 MG tablet Take 50 mg by mouth every 6 (six) hours as needed. Patient not taking: Reported on 09/16/2023    [provider]  traZODone  (DESYREL ) 50 MG tablet Take 1-2 tablets (50-100 mg total) by mouth at bedtime as needed for sleep. One week of 50 mg nightly as needed, then may increase to 50-100 mg nightly as needed for insomnia Patient not taking: Reported on 07/10/2023 06/06/23 08/05/23  Rainelle Pfeiffer, MD    Family History Family History  Problem Relation Age of Onset   Diabetes Father    Diabetes Maternal  Grandmother    Heart disease Maternal Grandmother        CHF   Heart attack Maternal Grandmother    Crohn's disease Brother    Colon cancer Neg Hx    Colon polyps Neg Hx    Esophageal cancer Neg Hx    Stomach cancer Neg Hx    Rectal cancer Neg Hx     Social History Social History   Tobacco Use   Smoking status: Every Day    Current packs/day: 0.00    Average packs/day: 1 pack/day for 28.3 years (28.3 ttl pk-yrs)    Types: Cigarettes    Start date: 1995    Last attempt to quit:  01/02/2022    Years since quitting: 1.7   Smokeless tobacco: Former  Building Services Engineer status: Former   Substances: Nicotine  Substance Use Topics   Alcohol use: Yes    Comment: Occassionally.   Drug use: Not Currently    Frequency: 2.0 times per week    Types: Marijuana     Allergies   Patient has no known allergies.   Review of Systems Review of Systems  Constitutional:  Negative for chills and fever.  HENT:  Positive for congestion. Negative for ear pain and sore throat.   Eyes:  Negative for pain and visual disturbance.  Respiratory:  Positive for cough and wheezing. Negative for shortness of breath.   Cardiovascular:  Negative for chest pain and palpitations.  Gastrointestinal:  Negative for abdominal pain and vomiting.  Genitourinary:  Negative for dysuria and hematuria.  Musculoskeletal:  Negative for arthralgias and back pain.  Skin:  Negative for color change and rash.  Neurological:  Negative for seizures and syncope.  All other systems reviewed and are negative.    Physical Exam Triage Vital Signs ED Triage Vitals  Encounter Vitals Group     BP 09/16/23 0841 (!) 165/108     Systolic BP Percentile --      Diastolic BP Percentile --      Pulse Rate 09/16/23 0841 81     Resp 09/16/23 0841 20     Temp 09/16/23 0841 99.2 F (37.3 C)     Temp Source 09/16/23 0841 Oral     SpO2 09/16/23 0841 96 %     Weight --      Height --      Head Circumference --      Peak Flow --       Pain Score 09/16/23 0843 9     Pain Loc --      Pain Education --      Exclude from Growth Chart --    No data found.  Updated Vital Signs BP (!) 165/108 (BP Location: Left Arm)   Pulse 81   Temp 99.2 F (37.3 C) (Oral)   Resp 20   SpO2 96%   Visual Acuity Right Eye Distance:   Left Eye Distance:   Bilateral Distance:    Right Eye Near:   Left Eye Near:    Bilateral Near:     Physical Exam Vitals and nursing note reviewed.  Constitutional:      General: He is not in acute distress.    Appearance: He is well-developed.  HENT:     Head: Normocephalic and atraumatic.  Eyes:     Conjunctiva/sclera: Conjunctivae normal.  Cardiovascular:     Rate and Rhythm: Normal rate and regular rhythm.     Heart sounds: No murmur heard. Pulmonary:     Effort: Pulmonary effort is normal. No respiratory distress.     Breath sounds: Normal breath sounds.  Abdominal:     Palpations: Abdomen is soft.     Tenderness: There is no abdominal tenderness.  Musculoskeletal:        General: No swelling.     Cervical back: Neck supple.  Skin:    General: Skin is warm and dry.     Capillary Refill: Capillary refill takes less than 2 seconds.  Neurological:     Mental Status: He is alert.  Psychiatric:        Mood and Affect: Mood normal.      UC Treatments / Results  Labs (all labs ordered are listed, but only abnormal results are displayed) Labs Reviewed - No data to display  EKG   Radiology DG Chest 2 View Result Date: 09/16/2023 CLINICAL DATA:  Cough and fever EXAM: CHEST - 2 VIEW COMPARISON:  None Available. FINDINGS: Normal mediastinum and cardiac silhouette. Normal pulmonary vasculature. No evidence of effusion, infiltrate, or pneumothorax. No acute bony abnormality. IMPRESSION: No acute cardiopulmonary process. Electronically Signed   By: Jackquline Boxer M.D.   On: 09/16/2023 09:03    Procedures Procedures (including critical care time)  Medications Ordered in  UC Medications - No data to display  Initial Impression / Assessment and Plan / UC Course  I have reviewed the triage vital signs and the nursing notes.  Pertinent labs & imaging results that were available during my care of the patient were reviewed by me and considered in my medical decision making (see chart for details).     Bronchitis.  Lungs clear, chest xray negative. Will send in cough syrup, tessalon  with no improvement.  Continued supportive care discussed.  Work note provided.  Will give antibiotic, but advised to hold and only take if sx persists.  Final Clinical Impressions(s) / UC Diagnoses   Final diagnoses:  Acute cough  Acute bronchitis, unspecified organism     Discharge Instructions      Continue prednisone   Recommend Mucinex  and Flonase  for congestion  Hold antibiotic at this time, can initiate if no improvement Take cough syrup as needed.      ED Prescriptions     Medication Sig Dispense Auth. Provider   azithromycin  (ZITHROMAX  Z-PAK) 250 MG tablet Take 2 tablets by mouth on the first day, take 1 tablet by for remaining days. 6 each Ward, Lou Loewe Z, PA-C   promethazine -dextromethorphan (PROMETHAZINE -DM) 6.25-15 MG/5ML syrup Take 5 mLs by mouth 4 (four) times daily as needed for cough. 118 mL Ward, Harlene PEDLAR, PA-C      PDMP not reviewed this encounter.   Ward, Harlene PEDLAR, PA-C 09/16/23 [phone]

## 2023-09-17 ENCOUNTER — Ambulatory Visit (HOSPITAL_COMMUNITY): Payer: 59 | Admitting: Licensed Clinical Social Worker

## 2023-09-24 ENCOUNTER — Telehealth (HOSPITAL_BASED_OUTPATIENT_CLINIC_OR_DEPARTMENT_OTHER): Payer: 59 | Admitting: Psychiatry

## 2023-09-24 ENCOUNTER — Ambulatory Visit (HOSPITAL_COMMUNITY): Payer: 59 | Admitting: Licensed Clinical Social Worker

## 2023-09-24 ENCOUNTER — Encounter (HOSPITAL_COMMUNITY): Payer: Self-pay | Admitting: Psychiatry

## 2023-09-24 VITALS — Wt 250.0 lb

## 2023-09-24 DIAGNOSIS — F4312 Post-traumatic stress disorder, chronic: Secondary | ICD-10-CM | POA: Diagnosis not present

## 2023-09-24 DIAGNOSIS — F331 Major depressive disorder, recurrent, moderate: Secondary | ICD-10-CM | POA: Diagnosis not present

## 2023-09-24 MED ORDER — ESCITALOPRAM OXALATE 20 MG PO TABS: 20.0000 mg | ORAL_TABLET | Freq: Every day | ORAL | 2 refills | Status: DC

## 2023-09-24 NOTE — Progress Notes (Signed)
[facility] MD Virtual Progress Note   Patient Location: Home Provider Location: Office  I connect with patient by video and verified that I am speaking with correct person by using two identifiers. I discussed the limitations of evaluation and management by telemedicine and the availability of in person appointments. I also discussed with the patient that there may be a patient responsible charge related to this service. The patient expressed understanding and agreed to proceed.  Micheal Roth [id number] 48 y.o.  09/24/2023 8:33 AM  History of Present Illness:  Patient is evaluated by video session.  He reported lately being sick but now getting better.  He had upper respiratory infection.  He is pleased as medicine working.  He is no longer taking hydroxyzine every night for sleep is better.  He recently moved to his mother and stepfather home to save some money so he can find a better place in the future.  He started working and he feel going back to work at health.  He works for [employer] as a Curator.  His oldest lives with them and he sees his son on occasions.  Patient told he is still separated from his wife but they are talking and communication is better.  Denies any mania, psychosis, hallucination.  He started seeing Micheal Fearing on a regular basis that is helping his PTSD symptoms.  Patient has a history of childhood trauma and had nightmares flashbacks.  His appetite is okay.  His weight is stable.  He has a chronic back pain but taking ibuprofen.  Like to focus on his general health.  Denies any feeling of hopelessness or worthlessness.  Denies any suicidal thoughts.  He drinks alcohol on occasions but denies any blackouts, binge, illegal substance use.  He was seen in the emergency room for possible Tory infection.  Past Psychiatric History: History of depression and anxiety. Had passive and fleeting suicidal thoughts and seen in the behavioral  health urgent care. Did PHP and IOP. No history of suicidal attempt. History of difficult childhood and trauma.    Outpatient Encounter Medications as of 09/24/2023  Medication Sig   acetaminophen (TYLENOL) 500 MG tablet Take 500 mg by mouth every 6 (six) hours as needed.   albuterol (VENTOLIN HFA) 108 (90 Base) MCG/ACT inhaler Inhale 2 puffs into the lungs every 4 (four) hours as needed for wheezing or shortness of breath. Last used: 0715-720 am today   azithromycin (ZITHROMAX Z-PAK) 250 MG tablet Take 2 tablets by mouth on the first day, take 1 tablet by for remaining days.   escitalopram (LEXAPRO) 20 MG tablet Take 1 tablet (20 mg total) by mouth daily.   hydrOXYzine (ATARAX) 50 MG tablet Take 1-2 tablets (50-100 mg total) by mouth at bedtime as needed and may repeat dose one time if needed for anxiety.   ibuprofen (ADVIL) 200 MG tablet Take 200 mg by mouth every 6 (six) hours as needed.   omeprazole (PRILOSEC OTC) 20 MG tablet Take 20 mg by mouth daily before breakfast.   ondansetron (ZOFRAN-ODT) 4 MG disintegrating tablet Take 1 tablet (4 mg total) by mouth every 8 (eight) hours as needed for nausea or vomiting.   promethazine-dextromethorphan (PROMETHAZINE-DM) 6.25-15 MG/5ML syrup Take 5 mLs by mouth 4 (four) times daily as needed for cough.   [DISCONTINUED] escitalopram (LEXAPRO) 20 MG tablet Take 1 tablet (20 mg total) by mouth daily.   [DISCONTINUED] HYDROcodone-acetaminophen (NORCO) 7.5-325 MG tablet Take 1 tablet by mouth every 6 (six)  hours as needed. (Patient not taking: Reported on 09/16/2023)   [DISCONTINUED] naproxen (NAPROSYN) 500 MG tablet Take 500 mg by mouth 2 (two) times daily with a meal. (Patient not taking: Reported on 09/16/2023)   [DISCONTINUED] topiramate (TOPAMAX) 100 MG tablet Take 100 mg by mouth daily. (Patient not taking: Reported on 09/16/2023)   [DISCONTINUED] traMADol (ULTRAM) 50 MG tablet Take 50 mg by mouth every 6 (six) hours as needed. (Patient not taking:  Reported on 09/16/2023)   [DISCONTINUED] traZODone (DESYREL) 50 MG tablet Take 1-2 tablets (50-100 mg total) by mouth at bedtime as needed for sleep. One week of 50 mg nightly as needed, then may increase to 50-100 mg nightly as needed for insomnia (Patient not taking: Reported on 07/10/2023)   No facility-administered encounter medications on file as of 09/24/2023.    No results found for this or any previous visit (from the past 2160 hours).   Psychiatric Specialty Exam: Physical Exam  Review of Systems  Respiratory:  Positive for cough.     Weight 250 lb (113.4 kg).There is no height or weight on file to calculate BMI.  General Appearance: Fairly Groomed and long beard   Eye Contact:  Fair  Speech:   fast but clear  Volume:  Normal  Mood:  Euthymic  Affect:  Congruent  Thought Process:  Goal Directed  Orientation:  Full (Time, Place, and Person)  Thought Content:  WDL  Suicidal Thoughts:  No  Homicidal Thoughts:  No  Memory:  Immediate;   Good Recent;   Good Remote;   Fair  Judgement:  Intact  Insight:  Present  Psychomotor Activity:  Increased  Concentration:  Concentration: Good and Attention Span: Good  Recall:  Good  Fund of Knowledge:  Good  Language:  Good  Akathisia:  No  Handed:  Right  AIMS (if indicated):     Assets:  Communication Skills Desire for Improvement Housing Social Support Talents/Skills Transportation  ADL's:  Intact  Cognition:  WNL  Sleep:  better     Assessment/Plan: MDD (major depressive disorder), recurrent episode, moderate (HCC) - Plan: escitalopram (LEXAPRO) 20 MG tablet  Chronic post-traumatic stress disorder (PTSD) - Plan: escitalopram (LEXAPRO) 20 MG tablet  I review notes from other providers and current medication.  He is doing fine on the Lexapro.  He has no tremor or shakes or any EPS.  He is not taking hydroxyzine every night.  He takes occasionally when he cannot sleep.  He is back to work and living with his mother and  stepfather place.  He is no longer taking trazodone and Topamax.  Discussed medication side effects and benefits.  Encouraged to continue Lexapro 20 mg daily.  He does not need a new prescription of hydroxyzine however he understand he will call us back if needed.  Encouraged to continue therapy with Micheal Fearing.  Recommended to call us back with any question or any concern.  Follow-up in 3 months   Follow Up Instructions:     I discussed the assessment and treatment plan with the patient. The patient was provided an opportunity to ask questions and all were answered. The patient agreed with the plan and demonstrated an understanding of the instructions.   The patient was advised to call back or seek an in-person evaluation if the symptoms worsen or if the condition fails to improve as anticipated.    Collaboration of Care: Other provider involved in patient's care AEB notes are available in epic to review  Patient/Guardian was  advised Release of Information must be obtained prior to any record release in order to collaborate their care with an outside provider. Patient/Guardian was advised if they have not already done so to contact the registration department to sign all necessary forms in order for [place] to release information regarding their care.   Consent: Patient/Guardian gives verbal consent for treatment and assignment of benefits for services provided during this visit. Patient/Guardian expressed understanding and agreed to proceed.     I provided 24 minutes of non face to face time during this encounter.  Note: This document was prepared by [employer] voice dictation technology and any errors that results from this process are unintentional.    Cleotis Nipper, MD 09/24/2023

## 2023-10-01 ENCOUNTER — Ambulatory Visit (INDEPENDENT_AMBULATORY_CARE_PROVIDER_SITE_OTHER): Payer: 59 | Admitting: Licensed Clinical Social Worker

## 2023-10-01 DIAGNOSIS — F4312 Post-traumatic stress disorder, chronic: Secondary | ICD-10-CM

## 2023-10-01 DIAGNOSIS — F331 Major depressive disorder, recurrent, moderate: Secondary | ICD-10-CM | POA: Diagnosis not present

## 2023-10-01 NOTE — Progress Notes (Unsigned)
THERAPIST PROGRESS NOTE   Session Date: 10/01/2023  Session Time: 1600 - 1650  Participation Level: Active  Behavioral Response: CasualAlertEuthymic  Type of Therapy: Individual Therapy  Treatment Goals addressed:  - LTG: "Try to make sure I can deal with the depression as it comes" (OP Depression) - LTG: Reduce frequency, intensity, and duration of depression symptoms so that daily functioning is improved (OP Depression) - LTG: Increase coping skills to manage depression and improve ability to perform daily activities (OP Depression) - STG: Reduce overall depression score by a minimum of 25% on the Patient Health Questionnaire (PHQ-9) (OP Depression) - STG: Micheal Roth will identify cognitive patterns and beliefs that support depression (OP Depression) - STG: Micheal Roth will reduce frequency of avoidant behaviors by 50% as evidenced by self-report in therapy sessions (Anxiety) - STG: Report a decrease in anxiety symptoms as evidenced by an overall reduction in anxiety score by a minimum of 25% on the Generalized Anxiety Disorder Scale (GAD-7) (Anxiety) - LTG: "Learn how to deal with it when it comes and find what triggers my anxiety" (Anxiety)  ProgressTowards Goals: Progressing  Interventions: CBT, Motivational Interviewing, and Strength-based  Summary: Micheal Roth is a 48 y.o. male with past psych history of MDD and GAD, presenting for follow-up therapy session in efforts to improve management of depressive and anxious symptoms. Patient actively engaged in session, maintaining pleasant moods and congruent affect throughout.  Engaged in reassessing PHQ-9 and GAD-7, engaging in further exploration of continued downward trend in scaling.  Patient further detailed recent events, sharing of having officially ended relationship with partner due to continued ongoing conflict and challenges in communication, improvements noticed across settings and patient's mood, and recent time off work with  respiratory illness.  Further engaged in exploration of understanding and importance of boundaries in relationships, processing overall importance to patient and the impact respect for boundaries has on relationships.  Revisited previously explored activity "challenging negative thoughts ", practicing activity for greater understanding.  Patient responded well to interventions. Patient continues to meet criteria for MDD and GAD . Patient will continue to benefit from engagement in outpatient therapy due to being the least restrictive service to meet presenting needs.      10/01/2023    4:06 PM 09/10/2023    4:18 PM 08/28/2023   10:28 AM 08/13/2023   10:27 AM 07/31/2023   10:16 AM  Depression screen PHQ 2/9  Decreased Interest 0 1 0 0 1  Down, Depressed, Hopeless 0 2 0 1 1  PHQ - 2 Score 0 3 0 1 2  Altered sleeping 1 1 0 0 0  Tired, decreased energy 0 0 1 0 2  Change in appetite 0 0 0 0 1  Feeling bad or failure about yourself  0 2 1 1 1   Trouble concentrating 0 0 0 0 1  Moving slowly or fidgety/restless 0 0 0 0 0  Suicidal thoughts 1 1 0 1 1  PHQ-9 Score 2 7 2 3 8   Difficult doing work/chores Not difficult at all Not difficult at all Not difficult at all Not difficult at all Somewhat difficult   Flowsheet Row Counselor from 10/01/2023 in [facility] at [facility] ED from 09/16/2023 in [facility] at [facility]) Counselor from 08/13/2023 in [facility] at [facility] RISK CATEGORY No Risk No Risk Low Risk         10/01/2023    4:06 PM 09/10/2023    4:16 PM  08/28/2023   10:21 AM 08/13/2023   10:19 AM  GAD 7 : Generalized Anxiety Score  Nervous, Anxious, on Edge 0 2 1 1   Control/stop worrying 0 0 0 1  Worry too much - different things 0 1 0 0  Trouble relaxing 0 1 0 0  Restless 0 0 0 0  Easily annoyed or irritable 1 1 1 1   Afraid - awful might happen 0 0 0 0  Total GAD 7 Score 1 5 2 3   Anxiety  Difficulty Not difficult at all Not difficult at all Not difficult at all Not difficult at all   Suicidal/Homicidal: No  Therapist Response: Clinician utilized CBT, MI, and strengths based techniques to support pt in navigating reported sxs and stressors. Actively engaged patient in check-in, assessing mood and affect.  Administered PHQ-9 and GAD-7, engaging patient in exploration of findings.  Actively listened to patient's recounts of recent weeks, further evoking patient's thoughts and perspectives surrounding events, exploring thoughts related to patient's adjusted perspectives and eliciting further details surrounding motivating factors.  Supported patient in defining how cognitive distortions impact thoughts, further engaging patient in processing how this in turn impacts feelings, and in [place] behaviors.  Engaged patient in "challenging negative thoughts" activity, practicing from previous session and efforts to reinforce skill.   Clinician reassessed severity of sxs, and presence of any safety concerns. Clinician provided support and empathy to patient during session.   Plan: Return again in 1 weeks.  Diagnosis:  Encounter Diagnoses  Name Primary?   MDD (major depressive disorder), recurrent episode, moderate (HCC) Yes   Chronic post-traumatic stress disorder (PTSD)      Collaboration of Care: Other None necessary at this time.  Patient/Guardian was advised Release of Information must be obtained prior to any record release in order to collaborate their care with an outside provider. Patient/Guardian was advised if they have not already done so to contact the registration department to sign all necessary forms in order for [place] to release information regarding their care.   Consent: Patient/Guardian gives verbal consent for treatment and assignment of benefits for services provided during this visit. Patient/Guardian expressed understanding and agreed to proceed.   Leisa Lenz, MSW,  LCSW 10/01/2023,  4:55 PM

## 2023-10-08 ENCOUNTER — Ambulatory Visit (INDEPENDENT_AMBULATORY_CARE_PROVIDER_SITE_OTHER): Payer: 59 | Admitting: Licensed Clinical Social Worker

## 2023-10-08 DIAGNOSIS — Z91199 Patient's noncompliance with other medical treatment and regimen due to unspecified reason: Secondary | ICD-10-CM

## 2023-10-08 NOTE — Progress Notes (Signed)
THERAPIST PROGRESS NOTE   Session Date: 10/08/2023  Session Time: 1600  Patient no-showed today's appointment; appointment was for follow up therapy, provider notified for review of record, patient agrees to reschedule missed appointment.   Leisa Lenz, MSW, LCSW 10/08/2023,  9:06 AM

## 2023-10-15 ENCOUNTER — Encounter (HOSPITAL_COMMUNITY): Payer: Self-pay

## 2023-10-15 ENCOUNTER — Ambulatory Visit (INDEPENDENT_AMBULATORY_CARE_PROVIDER_SITE_OTHER): Payer: 59 | Admitting: Licensed Clinical Social Worker

## 2023-10-15 NOTE — Progress Notes (Signed)
 THERAPIST PROGRESS NOTE   Session Date: 10/15/2023  Session Time: 1600  Pt attended today's scheduled visit, presenting in flat moods and congruent affect, detailing of having been sick again over the past week, with cold/flu like sxs. Pt detailed attempts to attend today's visit due to missing last week, 10/08/23, due to a work emergency. Clinician advised pt to go home in efforts to manage illness. Confirmed availability for next scheduled appt.  Pt denied presentation of sxs or significant stressors.  Patsi Boots, MSW, LCSW 10/15/2023,  4:10 PM

## 2023-10-22 ENCOUNTER — Ambulatory Visit (INDEPENDENT_AMBULATORY_CARE_PROVIDER_SITE_OTHER): Payer: 59 | Admitting: Licensed Clinical Social Worker

## 2023-10-22 DIAGNOSIS — F411 Generalized anxiety disorder: Secondary | ICD-10-CM | POA: Diagnosis not present

## 2023-10-22 DIAGNOSIS — F331 Major depressive disorder, recurrent, moderate: Secondary | ICD-10-CM | POA: Diagnosis not present

## 2023-10-22 NOTE — Progress Notes (Unsigned)
THERAPIST PROGRESS NOTE   Session Date: 10/22/2023  Session Time: 1605 - 1659  Participation Level: Active  Behavioral Response: CasualAlertEuthymic  Type of Therapy: Individual Therapy  Treatment Goals addressed:  - LTG: "Try to make sure I can deal with the depression as it comes" (OP Depression) - LTG: Reduce frequency, intensity, and duration of depression symptoms so that daily functioning is improved (OP Depression) - LTG: Increase coping skills to manage depression and improve ability to perform daily activities (OP Depression) - STG: Reduce overall depression score by a minimum of 25% on the Patient Health Questionnaire (PHQ-9) (OP Depression)MET  - STG: Jona will identify cognitive patterns and beliefs that support depression (OP Depression)MET  - STG: Corinthian will reduce frequency of avoidant behaviors by 50% as evidenced by self-report in therapy sessions (Anxiety) - STG: Report a decrease in anxiety symptoms as evidenced by an overall reduction in anxiety score by a minimum of 25% on the Generalized Anxiety Disorder Scale (GAD-7) (Anxiety) MET  - LTG: "Learn how to deal with it when it comes and find what triggers my anxiety" (Anxiety)  ProgressTowards Goals: Progressing  Interventions: CBT, Motivational Interviewing, and Strength-based  Summary: Micheal Roth is a 48 y.o. male with past psych history of MDD and GAD, presenting for follow-up therapy session in efforts to improve management of depressive and anxious symptoms. Patient actively engaged in session, maintaining pleasant moods and congruent affect throughout.   - Pt actively engaged in reassessing PHQ-9 and GAD-7, further exploring current scores, trends, and variances in reported sxs from prior screenings. Pt detailed having experienced an increase in depressive sxs throughout the past two weeks, believing sxs to be secondary to challenges encountered surrounding prolonged respiratory illness. Pt further  reported increased challenges sleeping due to having pulled a muscle in his back as a result of coughing, causing significant pain and discomfort when attempting to sleep, resulting in challenges sleeping, increased fatigue, lack of interest in things, and feeling down and depressed. - Engaged in 3 mo. Review of tx plan, exploring individualized tx goals specific to management of depression and anxiety, processing progress to date, and determining having "Completed/Met" 3/9 tx goals, with continued progress towards 6/9 goals. - Reviewed previous provided "Challenging Negative Thoughts" worksheet, exploring pt's efforts at implementing skill during recent weeks when challenged with increased negative thoughts, secondary to recent ongoing respiratory sickness.   Patient responded well to interventions. Patient continues to meet criteria for MDD and GAD . Patient will continue to benefit from engagement in outpatient therapy due to being the least restrictive service to meet presenting needs.      10/22/2023    4:14 PM 10/01/2023    4:06 PM 09/10/2023    4:18 PM 08/28/2023   10:28 AM 08/13/2023   10:27 AM  Depression screen PHQ 2/9  Decreased Interest 1 0 1 0 0  Down, Depressed, Hopeless 1 0 2 0 1  PHQ - 2 Score 2 0 3 0 1  Altered sleeping 1 1 1  0 0  Tired, decreased energy 1 0 0 1 0  Change in appetite 0 0 0 0 0  Feeling bad or failure about yourself  1 0 2 1 1   Trouble concentrating 0 0 0 0 0  Moving slowly or fidgety/restless 0 0 0 0 0  Suicidal thoughts 0 1 1 0 1  PHQ-9 Score 5 2 7 2 3   Difficult doing work/chores Not difficult at all Not difficult at all Not difficult at all Not difficult at  all Not difficult at all   [facility] Counselor from 10/01/2023 in [facility] at [facility] ED from 09[facility]16/2023 in [facility] at [facility]) Counselor from 08/13/2023 in [facility] Outpatient Behavioral Health at [facility] RISK CATEGORY  No Risk No Risk Low Risk         10/22/2023    4:12 PM 10/01/2023    4:06 PM 09/10/2023    4:16 PM 08/28/2023   10:21 AM  GAD 7 : Generalized Anxiety Score  Nervous, Anxious, on Edge 0 0 2 1  Control/stop worrying 0 0 0 0  Worry too much - different things 0 0 1 0  Trouble relaxing 0 0 1 0  Restless 0 0 0 0  Easily annoyed or irritable 1 1 1 1   Afraid - awful might happen 0 0 0 0  Total GAD 7 Score 1 1 5 2   Anxiety Difficulty Not difficult at all Not difficult at all Not difficult at all Not difficult at all   Suicidal/Homicidal: No  Therapist Response: Clinician utilized CBT, MI, and strengths based techniques to support pt in navigating reported sxs and stressors. Actively engaged patient in check-in, assessing mood and affect.   - Re-administered PHQ-9 and GAD-7, engaging patient in further exploration of current scores, variances in previous scores, and observed trends and identified sxs of the past three weeks, processing pt's thoughts and perspectives surrounding sxs being secondary to respiratory illness over the past 2-3 weeks.  - Actively listened to pt's recounts of recent events, eliciting further thoughts, feelings and perspectives surrounding events and presenting stressors, further supporting pt in processing stressors within relationship with partner, and explored 'Time out" technique to support in conflict resolution. - Revisited "Challenging Negative Thoughts" from previous visit, supporting pt in exploring implementation, utilizing socratic questioning to further process effectiveness. - Determined plan to begin exploring cognitive distortions at next visit to support in management, and explore relationship to anxious thoughts.   Clinician reassessed severity of sxs, and presence of any safety concerns. Clinician provided support and empathy to patient during session.   Plan: Return again in 2 weeks.  Diagnosis:  Encounter Diagnoses  Name Primary?   MDD (major  depressive disorder), recurrent episode, moderate (HCC) Yes   GAD (generalized anxiety disorder)     Collaboration of Care: Psychiatrist AEB provider notes available in EHR.  Patient/Guardian was advised Release of Information must be obtained prior to any record release in order to collaborate their care with an outside provider. Patient/Guardian was advised if they have not already done so to contact the registration department to sign all necessary forms in order for [place] to release information regarding their care.   Consent: Patient/Guardian gives verbal consent for treatment and assignment of benefits for services provided during this visit. Patient/Guardian expressed understanding and agreed to proceed.   Leisa Lenz, MSW, LCSW 10/22/2023,  4:15 PM

## 2023-10-23 ENCOUNTER — Ambulatory Visit (AMBULATORY_SURGERY_CENTER): Payer: 59 | Admitting: *Deleted

## 2023-10-23 VITALS — Ht 72.0 in | Wt 250.0 lb

## 2023-10-23 DIAGNOSIS — Z1211 Encounter for screening for malignant neoplasm of colon: Secondary | ICD-10-CM

## 2023-10-23 MED ORDER — SUFLAVE 178.7 G PO SOLR: 1.0000 | Freq: Once | ORAL | 0 refills | Status: AC

## 2023-10-23 NOTE — Progress Notes (Signed)
Pt's name and DOB verified at the beginning of the pre-visit wit 2 identifiers  Permission given to speak with  Pt denies any difficulty with ambulating,sitting, laying down or rolling side to side  Pt has no issues with ambulation   Pt has no issues moving head neck or swallowing  No egg or soy allergy known to patient   No issues known to pt with past sedation with any surgeries or procedures  Pt denies having issues being intubated  Patient denies ever being intubated  No FH of Malignant Hyperthermia  Pt is not on diet pills or shots  Pt is not on home 02   Pt is not on blood thinners   Pt denies issues with constipation   Pt is not on dialysis  Pt denise any abnormal heart rhythms   Pt denies any upcoming cardiac testing  Patient's chart reviewed by Cathlyn Parsons CNRA prior to pre-visit and patient appropriate for the [facility].  Pre-visit completed and red dot placed by patient's name on their procedure day (on provider's schedule).   ot reviewed by CRNA prior to [facility]  Visit by phone  Pt states weight is 250 lb  IInstructions reviewed. Pt given [facility], [facility] main # and MD on call # prior to instructions.  Pt states understanding of instructions. Instructed to review again prior to procedure. Pt states they will.   Informed pt that they will receive a text or  call from [facility] regarding there prep med.

## 2023-10-29 ENCOUNTER — Ambulatory Visit (HOSPITAL_COMMUNITY): Payer: 59 | Admitting: Licensed Clinical Social Worker

## 2023-11-05 ENCOUNTER — Ambulatory Visit (INDEPENDENT_AMBULATORY_CARE_PROVIDER_SITE_OTHER): Payer: 59 | Admitting: Licensed Clinical Social Worker

## 2023-11-05 DIAGNOSIS — F331 Major depressive disorder, recurrent, moderate: Secondary | ICD-10-CM

## 2023-11-05 DIAGNOSIS — F411 Generalized anxiety disorder: Secondary | ICD-10-CM

## 2023-11-05 NOTE — Progress Notes (Unsigned)
 THERAPIST PROGRESS NOTE   Session Date: 11/05/2023  Session Time: 1606 - 1703 Virtual Visit via Video Note  I connected with Micheal Roth on 11/05/23 at  4:06 PM EST by a video enabled telemedicine application and verified that I am speaking with the correct person using two identifiers.  Location: Patient: Home Provider: Home Office   I discussed the limitations of evaluation and management by telemedicine and the availability of in person appointments. The patient expressed understanding and agreed to proceed.  The patient was advised to call back or seek an in-person evaluation if the symptoms worsen or if the condition fails to improve as anticipated.  I provided 56 minutes of non-face-to-face time during this encounter.  Participation Level: Active  Behavioral Response: CasualAlertDepressed and Euthymic  Type of Therapy: Individual Therapy  Treatment Goals addressed:  - LTG: "Try to make sure I can deal with the depression as it comes" (OP Depression) - LTG: Reduce frequency, intensity, and duration of depression symptoms so that daily functioning is improved (OP Depression) - LTG: Increase coping skills to manage depression and improve ability to perform daily activities (OP Depression) - STG: Micheal Roth will reduce frequency of avoidant behaviors by 50% as evidenced by self-report in therapy sessions (Anxiety) - LTG: "Learn how to deal with it when it comes and find what triggers my anxiety" (Anxiety)  ProgressTowards Goals: Progressing  Interventions: CBT, Motivational Interviewing, and Strength-based  Summary: Micheal Roth is a 48 y.o. male with past psych history of MDD and GAD, presenting for follow-up therapy session in efforts to improve management of depressive and anxious symptoms. Patient actively engaged in session, presenting in depressed moods and congruent affect, exhibiting improved presentation throughout.   - Pt actively detailed having experienced a rough  few weeks, sharing of the passing of partners father last Monday, creating additional challenges due to it being partners birthday on Wednesday. Pt further continued to detail hx of contentious relationship with partners parents for the past 20-30 years, not being welcoming and damaging to relationship overall between pt and partner, proving to bring back memories with passing of partners father.  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  - Engaged in review of cognitive triangle, further exploring understanding and observation of relationship between thoughts, feelings and behaviors and the implications of negative perspectives and thought patterns on moods. - Actively engaged in utilization of 'Challenging Negative Thoughts' skill, in efforts to explore recent negative thoughts, and challenging validity of such thoughts. - Reassessed presenting depressive and anxious sxs, comparing current scores and prior screening, noting of minor variances and contributing factors.  Patient responded well to interventions. Patient continues to meet criteria for MDD and GAD . Patient will continue to benefit from engagement in outpatient therapy due to being the least restrictive service to meet presenting needs.      11/05/2023    4:51 PM 10/22/2023    4:14 PM 10/01/2023    4:06 PM 09/10/2023    4:18 PM 08/28/2023   10:28 AM  Depression screen PHQ 2/9  Decreased Interest 1 1 0 1 0  Down, Depressed, Hopeless 1 1 0 2 0  PHQ - 2 Score 2 2 0 3 0  Altered sleeping 1 1 1 1  0  Tired, decreased energy 1 1 0 0 1  Change in appetite 0  0 0 0 0  Feeling bad or failure about yourself  1 1 0 2 1  Trouble concentrating 1 0 0 0 0  Moving slowly or fidgety/restless 0 0 0 0 0  Suicidal thoughts 0 0 1 1 0  PHQ-9 Score 6  5 2 7 2   Difficult doing work/chores Somewhat difficult Not difficult at all Not difficult at all Not difficult at all Not difficult at all   [facility] Counselor from 10/01/2023 in [facility] at [facility] from 09/16/2023 in [facility] Counselor from 08/13/2023 in [facility] at [facility] RISK CATEGORY No Risk No Risk Low Risk         11/05/2023    4:50 PM 10/22/2023    4:12 PM 10/01/2023    4:06 PM 09/10/2023    4:16 PM  GAD 7 : Generalized Anxiety Score  Nervous, Anxious, on Edge 0 0 0 2  Control/stop worrying 0 0 0 0  Worry too much - different things 0 0 0 1  Trouble relaxing 1 0 0 1  Restless 0 0 0 0  Easily annoyed or irritable 1 1 1 1   Afraid - awful might happen 0 0 0 0  Total GAD 7 Score 2 1 1 5   Anxiety Difficulty Not difficult at all Not difficult at all Not difficult at all Not difficult at all   Suicidal/Homicidal: No  Therapist Response: Clinician utilized CBT, MI, and strengths based techniques to support pt in navigating reported sxs and stressors. Actively engaged patient in check-in, assessing mood and affect.   - Evoked pt's recounts of recent events, actively listening to pt's details of recent challenges and stressors experienced over past two weeks, further supporting pt in validating feelings, and processing identified feelings in relation to events. - Encouraged pt to consider how ways in which pt proved to respond to triggering events and stressors has proven ineffective, and prompted pt to identify barriers to utilizing effective, healthier means of managing factors. - Explored pt's abilities and/or barriers to utilizing learned skills, engaging pt in revisiting of 'Challenging Negative Thoughts' skill to process identified thoughts pt reported experiencing over the past two weeks.    Clinician reassessed severity of sxs, and presence of any safety  concerns. Clinician provided support and empathy to patient during session.   Plan: Return again in 2 weeks.  Diagnosis:  Encounter Diagnoses  Name Primary?   MDD (major depressive disorder), recurrent episode, moderate (HCC) Yes   GAD (generalized anxiety disorder)      Collaboration of Care: Psychiatrist AEB provider notes available in EHR.  Patient/Guardian was advised Release of Information must be obtained prior to any record release in order to collaborate their care with an outside provider. Patient/Guardian was advised if they have not already done so to contact the registration department to sign all necessary forms in order for [place] to release information regarding their care.   Consent: Patient/Guardian gives verbal consent for treatment and assignment of benefits for services provided during this visit. Patient/Guardian expressed understanding and agreed to proceed.   Leisa Lenz, MSW, LCSW 11/05/2023,  5:00 PM

## 2023-11-07 ENCOUNTER — Encounter: Payer: Self-pay | Admitting: Gastroenterology

## 2023-11-12 ENCOUNTER — Encounter: Payer: Self-pay | Admitting: Gastroenterology

## 2023-11-12 ENCOUNTER — Ambulatory Visit (HOSPITAL_COMMUNITY): Payer: 59 | Admitting: Licensed Clinical Social Worker

## 2023-11-12 ENCOUNTER — Ambulatory Visit (AMBULATORY_SURGERY_CENTER): Payer: 59 | Admitting: Gastroenterology

## 2023-11-12 VITALS — BP 131/75 | HR 66 | Temp 99.0°F | Resp 11 | Ht 72.0 in | Wt 250.0 lb

## 2023-11-12 DIAGNOSIS — K635 Polyp of colon: Secondary | ICD-10-CM | POA: Diagnosis not present

## 2023-11-12 DIAGNOSIS — K648 Other hemorrhoids: Secondary | ICD-10-CM

## 2023-11-12 DIAGNOSIS — D120 Benign neoplasm of cecum: Secondary | ICD-10-CM

## 2023-11-12 DIAGNOSIS — K621 Rectal polyp: Secondary | ICD-10-CM | POA: Diagnosis not present

## 2023-11-12 DIAGNOSIS — K640 First degree hemorrhoids: Secondary | ICD-10-CM

## 2023-11-12 DIAGNOSIS — D128 Benign neoplasm of rectum: Secondary | ICD-10-CM

## 2023-11-12 DIAGNOSIS — Z1211 Encounter for screening for malignant neoplasm of colon: Secondary | ICD-10-CM | POA: Diagnosis present

## 2023-11-12 DIAGNOSIS — D127 Benign neoplasm of rectosigmoid junction: Secondary | ICD-10-CM

## 2023-11-12 HISTORY — PX: COLONOSCOPY: SHX174

## 2023-11-12 MED ORDER — SODIUM CHLORIDE 0.9 % IV SOLN: 500.0000 mL | Freq: Once | INTRAVENOUS | Status: DC

## 2023-11-12 NOTE — Progress Notes (Signed)
 Called to room to assist during endoscopic procedure.  Patient ID and intended procedure confirmed with present staff. Received instructions for my participation in the procedure from the performing physician.

## 2023-11-12 NOTE — Progress Notes (Signed)
 Pt's states no medical or surgical changes since previsit or office visit.

## 2023-11-12 NOTE — Patient Instructions (Signed)

## 2023-11-12 NOTE — Progress Notes (Signed)
 GASTROENTEROLOGY PROCEDURE H&P NOTE   Primary Care Physician: Nonnie Done., MD    Reason for Procedure:  Colon Cancer screening  Plan:    Colonoscopy  Patient is appropriate for endoscopic procedure(s) in the ambulatory ([facility]) setting.  The nature of the procedure, as well as the risks, benefits, and alternatives were carefully and thoroughly reviewed with the patient. Ample time for discussion and questions allowed. The patient understood, was satisfied, and agreed to proceed.     HPI: Micheal Roth is a 48 y.o. male who presents for colonoscopy for routine Colon Cancer screening.  No active GI symptoms.  No known family history of colon cancer or related malignancy.  Patient is otherwise without complaints or active issues today.  Past Medical History:  Diagnosis Date   Anxiety    Arthritis    Asthma    sports   Chronic back pain    Chronic left shoulder pain    Chronic neck pain    Depression    GERD (gastroesophageal reflux disease)    h/o TOBACCO USER 05/10/2009   Qualifier: Diagnosis of   By: [employer]      Quit smoking cig 2 yrs ago - smoked for 20 yrs at 1.5ppd at most.   30 yr hx.   Still Smokes marijuana daily --> for 30-40 yrs. 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 Authorizing Provider  acetaminophen (TYLENOL) 500 MG tablet Take 500  mg by mouth every 6 (six) hours as needed. Patient not taking: Reported on 10/23/2023 10/10/13   [provider]  albuterol (VENTOLIN HFA) 108 (90 Base) MCG/ACT inhaler Inhale 2 puffs into the lungs every 4 (four) hours as needed for wheezing or shortness of breath. Last used: 0715-720 am today 07/22/23   [provider]  escitalopram (LEXAPRO) 20 MG tablet Take 1 tablet (20 mg total) by mouth daily. 09/24/23 12/23/23  Arfeen, Phillips Grout, MD  hydrOXYzine (ATARAX) 50 MG tablet Take 1-2 tablets (50-100 mg total) by mouth at bedtime as needed and may repeat dose one time if needed for anxiety. 07/10/23   Arfeen, Phillips Grout, MD  ibuprofen (ADVIL) 200 MG tablet Take 200 mg by mouth every 6 (six) hours as needed. 10/10/13   [provider]  omeprazole (PRILOSEC OTC) 20 MG tablet Take 20 mg by mouth daily before breakfast.    [provider]  ondansetron (ZOFRAN-ODT) 4 MG disintegrating tablet Take 1 tablet (4 mg total) by mouth every 8 (eight) hours as needed for nausea or vomiting. Patient not taking: Reported on 10/23/2023 05/01/23   Domenick Gong, MD    Current Outpatient Medications  Medication Sig Dispense Refill   acetaminophen (TYLENOL) 500 MG tablet Take 500 mg by mouth  every 6 (six) hours as needed. (Patient not taking: Reported on 10/23/2023)     albuterol (VENTOLIN HFA) 108 (90 Base) MCG/ACT inhaler Inhale 2 puffs into the lungs every 4 (four) hours as needed for wheezing or shortness of breath. Last used: 0715-720 am today     escitalopram (LEXAPRO) 20 MG tablet Take 1 tablet (20 mg total) by mouth daily. 30 tablet 2   hydrOXYzine (ATARAX) 50 MG tablet Take 1-2 tablets (50-100 mg total) by mouth at bedtime as needed and may repeat dose one time if needed for anxiety. 45 tablet 1   ibuprofen (ADVIL) 200 MG tablet Take 200 mg by mouth every 6 (six) hours as needed.     omeprazole (PRILOSEC OTC) 20 MG tablet Take 20 mg by mouth daily before breakfast.     ondansetron  (ZOFRAN-ODT) 4 MG disintegrating tablet Take 1 tablet (4 mg total) by mouth every 8 (eight) hours as needed for nausea or vomiting. (Patient not taking: Reported on 10/23/2023) 20 tablet 0   No current facility-administered medications for this visit.    Allergies as of 11/12/2023   (No Known Allergies)    Family History  Problem Relation Age of Onset   Diabetes Father    Diabetes Maternal Grandmother    Heart disease Maternal Grandmother        CHF   Heart attack Maternal Grandmother    Crohn's disease Brother    Colon cancer Neg Hx    Colon polyps Neg Hx    Esophageal cancer Neg Hx    Stomach cancer Neg Hx    Rectal cancer Neg Hx     Social History   Socioeconomic History   Marital status: Single    Spouse name: Not on file   Number of children: 2   Years of education: Not on file   Highest education level: GED or equivalent  Occupational History   Not on file  Tobacco Use   Smoking status: Every Day    Current packs/day: 0.00    Average packs/day: 1 pack/day for 28.3 years (28.3 ttl pk-yrs)    Types: Cigarettes    Start date: 49    Last attempt to quit: 01/02/2022    Years since quitting: 1.8   Smokeless tobacco: Former  Building services engineer status: Former   Substances: Nicotine  Substance and Sexual Activity   Alcohol use: Yes    Comment: Occassionally.   Drug use: Not Currently    Frequency: 2.0 times per week    Types: Marijuana   Sexual activity: Not Currently  Other Topics Concern   Not on file  Social History Narrative   Not on file   Social Drivers of Health   Financial Resource Strain: Not on file  Food Insecurity: Not on file  Transportation Needs: Not on file  Physical Activity: Not on file  Stress: Not on file  Social Connections: Not on file  Intimate Partner Violence: Not on file    Physical Exam: Vital signs in last 24 hours: @There  were no vitals taken for this visit. GEN: NAD EYE: Sclerae anicteric ENT: MMM CV:  Non-tachycardic Pulm: CTA b/l GI: Soft, NT/ND NEURO:  Alert & Oriented x 3   Doristine Locks, DO [facility]   11/12/2023 7:48 AM

## 2023-11-12 NOTE — Progress Notes (Signed)
 A/O x 3, gd SR's, VSS, report to RN

## 2023-11-12 NOTE — Op Note (Signed)
 [facility] Patient Name: Micheal Roth Procedure Date: 11/12/2023 8:23 AM MRN: [id number] Endoscopist: Doristine Locks , MD, [id number] Age: 48 Referring MD:  Date of Birth: October 23, 1975 Gender: Male Account #: [id number] Procedure:                Colonoscopy Indications:              Screening for colorectal malignant neoplasm, This                            is the patient's first colonoscopy.                           Family history notable for twin brother with Crohns                            Disease. Medicines:                Monitored Anesthesia Care Procedure:                Pre-Anesthesia Assessment:                           - Prior to the procedure, a History and Physical                            was performed, and patient medications and                            allergies were reviewed. The patient's tolerance of                            previous anesthesia was also reviewed. The risks                            and benefits of the procedure and the sedation                            options and risks were discussed with the patient.                            All questions were answered, and informed consent                            was obtained. Prior Anticoagulants: The patient has                            taken no anticoagulant or antiplatelet agents. ASA                            Grade Assessment: II - A patient with mild systemic                            disease. After reviewing the risks and benefits,  the patient was deemed in satisfactory condition to                            undergo the procedure.                           After obtaining informed consent, the colonoscope                            was passed under direct vision. Throughout the                            procedure, the patient's blood pressure, pulse, and                            oxygen saturations were monitored continuously. The                             CF [id number] #[id number] was introduced through the anus                            and advanced to the the terminal ileum. The                            colonoscopy was performed without difficulty. The                            patient tolerated the procedure well. The quality                            of the bowel preparation was good. The terminal                            ileum, ileocecal valve, appendiceal orifice, and                            rectum were photographed. Scope In: 8:34:44 AM Scope Out: 8:49:45 AM Scope Withdrawal Time: 0 hours 13 minutes 15 seconds  Total Procedure Duration: 0 hours 15 minutes 1 second  Findings:                 The perianal and digital rectal examinations were                            normal.                           A 6 mm polyp was found in the cecum. The polyp was                            sessile. The polyp was removed with a cold snare.                            Resection and retrieval were complete. Estimated  blood loss was minimal.                           Seven sessile polyps were found in the rectum and                            recto-sigmoid colon. The polyps were 2 to 5 mm in                            size. These polyps were removed with a cold snare.                            Resection and retrieval were complete. Estimated                            blood loss was minimal.                           Non-bleeding internal hemorrhoids were found during                            retroflexion. The hemorrhoids were small.                           The terminal ileum appeared normal. Complications:            No immediate complications. Estimated Blood Loss:     Estimated blood loss was minimal. Impression:               - One 6 mm polyp in the cecum, removed with a cold                            snare. Resected and retrieved.                           - Seven 2 to 5 mm polyps in the  rectum and at the                            recto-sigmoid colon, removed with a cold snare.                            Resected and retrieved.                           - Non-bleeding internal hemorrhoids.                           - The examined portion of the ileum was normal. Recommendation:           - Patient has a contact number available for                            emergencies. The signs and symptoms of potential  delayed complications were discussed with the                            patient. Return to normal activities tomorrow.                            Written discharge instructions were provided to the                            patient.                           - Resume previous diet.                           - Continue present medications.                           - Await pathology results.                           - Repeat colonoscopy for surveillance based on                            pathology results.                           - Return to GI office PRN. Doristine Locks, MD 11/12/2023 8:54:12 AM

## 2023-11-13 ENCOUNTER — Telehealth: Payer: Self-pay

## 2023-11-13 ENCOUNTER — Ambulatory Visit (INDEPENDENT_AMBULATORY_CARE_PROVIDER_SITE_OTHER): Admitting: Licensed Clinical Social Worker

## 2023-11-13 DIAGNOSIS — F331 Major depressive disorder, recurrent, moderate: Secondary | ICD-10-CM

## 2023-11-13 NOTE — Telephone Encounter (Signed)
  Follow up Call-     11/12/2023    7:54 AM  Call back number  Post procedure Call Back phone  # [phone]  Permission to leave phone message Yes     Patient questions:  Do you have a fever, pain , or abdominal swelling? No. Pain Score  0 *  Have you tolerated food without any problems? Yes.    Have you been able to return to your normal activities? Yes.    Do you have any questions about your discharge instructions: Diet   No. Medications  No. Follow up visit  No.  Do you have questions or concerns about your Care? No.  Actions: * If pain score is 4 or above: No action needed, pain <4.
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
## 2023-11-14 LAB — SURGICAL PATHOLOGY

## 2023-11-16 ENCOUNTER — Encounter: Payer: Self-pay | Admitting: Gastroenterology

## 2023-11-26 ENCOUNTER — Ambulatory Visit (INDEPENDENT_AMBULATORY_CARE_PROVIDER_SITE_OTHER): Payer: 59 | Admitting: Licensed Clinical Social Worker

## 2023-11-26 DIAGNOSIS — F411 Generalized anxiety disorder: Secondary | ICD-10-CM | POA: Diagnosis not present

## 2023-11-26 DIAGNOSIS — F331 Major depressive disorder, recurrent, moderate: Secondary | ICD-10-CM | POA: Diagnosis not present
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
## 2023-11-28 NOTE — Telephone Encounter (Signed)
 [facility] LCSW Note  11/28/2023   10:45 AM  Type of Contact:  Telephone  Clinician received message from office requesting to contact pt re presenting concerns. Pt expressed concerns of feeling down and experiencing depressed moods. Clinician assessed presenting risk to self or others, reporting pSI, denying plans or intent. Pt expressed feeling "Medications may not be working as they should, starting to feel as I did before, not able to go back to work, sleeping more, isolating more, not wanting to get out of bed, sick to my stomach". Clinician provided psychoed surrounding MDD and variances in freq. And severity of sxs. Across durations of time. Informed pt of intent to connect with scheduling to explore availability of appt times with med man provider, Dr. Lolly Mustache, MD, to explore concerns. Encouraged utilization of [facility] if believed to be risk to self, others, or unable to secure appt with med man provider within coming days.  Reviewed safety planning, specifically reiterating availability of immediate crisis support via 9-8-8 Suicide & Crisis [facility], and mobile crisis services.  Clinician collaborated with scheduling to secure, and relay appt times for pt, securing in-person appt with Arfeen, MD on 3/27.    Leisa Lenz, LCSW 11/28/2023  10:45 AM

## 2023-11-29 ENCOUNTER — Encounter (HOSPITAL_COMMUNITY): Payer: Self-pay | Admitting: Psychiatry

## 2023-11-29 ENCOUNTER — Ambulatory Visit (HOSPITAL_BASED_OUTPATIENT_CLINIC_OR_DEPARTMENT_OTHER): Admitting: Psychiatry

## 2023-11-29 ENCOUNTER — Encounter (HOSPITAL_COMMUNITY): Payer: Self-pay

## 2023-11-29 ENCOUNTER — Other Ambulatory Visit: Payer: Self-pay

## 2023-11-29 VITALS — BP 172/102 | HR 110 | Ht 72.0 in | Wt 256.0 lb

## 2023-11-29 DIAGNOSIS — F4312 Post-traumatic stress disorder, chronic: Secondary | ICD-10-CM

## 2023-11-29 DIAGNOSIS — F331 Major depressive disorder, recurrent, moderate: Secondary | ICD-10-CM | POA: Diagnosis not present

## 2023-11-29 DIAGNOSIS — F411 Generalized anxiety disorder: Secondary | ICD-10-CM | POA: Diagnosis not present

## 2023-11-29 MED ORDER — ARIPIPRAZOLE 5 MG PO TABS: ORAL_TABLET | ORAL | 0 refills | Status: DC

## 2023-11-29 NOTE — Progress Notes (Signed)
 BH MD/PA/NP OP Progress Note  Patient location; office Provider location; office   11/29/2023 3:08 PM Micheal Roth  MRN:  [id number]  Chief Complaint:  Chief Complaint  Patient presents with   Depression   Follow-up   HPI: Patient came today for his appointment.  He requested an earlier appointment because is not doing very well.  He noticed being isolated, withdrawn, no motivation and sleeping too much.  He reported a lot of challenges at job and for past few days he is not going to work.  He reported irritability and frustration.  He works for [employer] as a Curator.  He reported a lot of negative thoughts but no suicidal thoughts or paranoia.  Patient has a limited contact with her ex wife and patient is living with his mother and his stepfather.  He had a good support from them.  Patient see his older son on occasions.  He admitted not had left the house for past few days.  He is not sure what triggered it but combination of marital stress, job challenges and lately having some stomach issues may be contributing to his depression.  He is in therapy with Fayrene Fearing.  He occasionally has nightmares and flashbacks.  He feel overwhelmed and very nervous.  He denies any aggression, violence.  He is taking Lexapro 20 mg daily and reported no tremors shakes or any EPS.  He has not taken hydroxyzine because he is sleeping too much.  Visit Diagnosis:    ICD-10-CM   1. MDD (major depressive disorder), recurrent episode, moderate (HCC)  F33.1 ARIPiprazole (ABILIFY) 5 MG tablet    2. Chronic post-traumatic stress disorder (PTSD)  F43.12 ARIPiprazole (ABILIFY) 5 MG tablet      Past Psychiatric History: Reviewed. History of depression and anxiety. Had passive and fleeting suicidal thoughts and seen in the behavioral health urgent care. Did PHP and IOP. No history of suicidal attempt. History of difficult childhood and trauma.   Past Medical History:  Past Medical History:   Diagnosis Date   Anxiety    Arthritis    Asthma    sports   Chronic back pain    Chronic left shoulder pain    Chronic neck pain    Depression    GERD (gastroesophageal reflux disease)    h/o TOBACCO USER 05/10/2009   Qualifier: Diagnosis of   By: [employer]      Quit smoking cig 2 yrs ago - smoked for 20 yrs at 1.5ppd at most.   30 yr hx.   Still Smokes marijuana daily --> for 30-40 yrs. "     History of shingles    Migraines    Nausea and vomiting 04/09/2017   Paresthesia of left arm and leg    and weakness of left arm and leg   Seizures (HCC)    not in years    Past Surgical History:  Procedure Laterality Date   BACK SURGERY     COLONOSCOPY  11/12/2023   Doristine Locks at 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    Family Psychiatric History: Reviewed  Family History:  Family History  Problem Relation Age of Onset  Diabetes Father    Diabetes Maternal Grandmother    Heart disease Maternal Grandmother        CHF   Heart attack Maternal Grandmother    Crohn's disease Brother    Colon cancer Neg Hx    Colon polyps Neg Hx    Esophageal cancer Neg Hx    Stomach cancer Neg Hx    Rectal cancer Neg Hx     Social History:  Social History   Socioeconomic History   Marital status: Single    Spouse name: Not on file   Number of children: 2   Years of education: Not on file   Highest education level: GED or equivalent  Occupational History   Not on file  Tobacco Use   Smoking status: Every Day    Current packs/day: 0.00    Average packs/day: 1 pack/day for 28.3 years (28.3 ttl pk-yrs)    Types: Cigarettes    Start date: 29    Last attempt to quit: 01/02/2022    Years  since quitting: 1.9   Smokeless tobacco: Former  Building services engineer status: Former   Substances: Nicotine  Substance and Sexual Activity   Alcohol use: Yes    Comment: Occassionally.   Drug use: Not Currently    Frequency: 2.0 times per week    Types: Marijuana   Sexual activity: Yes  Other Topics Concern   Not on file  Social History Narrative   Not on file   Social Drivers of Health   Financial Resource Strain: Not on file  Food Insecurity: Not on file  Transportation Needs: Not on file  Physical Activity: Not on file  Stress: Not on file  Social Connections: Not on file    Allergies: No Known Allergies  Metabolic Disorder Labs: Lab Results  Component Value Date   HGBA1C 5.4 02/23/2017   No results found for: "PROLACTIN" Lab Results  Component Value Date   CHOL 177 02/23/2017   TRIG 175 (H) 02/23/2017   HDL 37 (L) 02/23/2017   CHOLHDL 4.8 02/23/2017   LDLCALC 105 (H) 02/23/2017   Lab Results  Component Value Date   TSH 2.304 07/26/2007    Therapeutic Level Labs: No results found for: "LITHIUM" No results found for: "VALPROATE" No results found for: "CBMZ"  Current Medications: Current Outpatient Medications  Medication Sig Dispense Refill   acetaminophen (TYLENOL) 500 MG tablet Take 500 mg by mouth every 6 (six) hours as needed.     albuterol (VENTOLIN HFA) 108 (90 Base) MCG/ACT inhaler Inhale 2 puffs into the lungs every 4 (four) hours as needed for wheezing or shortness of breath. Last used: 0715-720 am today     escitalopram (LEXAPRO) 20 MG tablet Take 1 tablet (20 mg total) by mouth daily. 30 tablet 2   hydrOXYzine (ATARAX) 50 MG tablet Take 1-2 tablets (50-100 mg total) by mouth at bedtime as needed and may repeat dose one time if needed for anxiety. 45 tablet 1   ibuprofen (ADVIL) 200 MG tablet Take 200 mg by mouth every 6 (six) hours as needed.     omeprazole (PRILOSEC OTC) 20 MG tablet Take 20 mg by mouth daily before breakfast.     ondansetron  (ZOFRAN-ODT) 4 MG disintegrating tablet Take 1 tablet (4 mg total) by mouth every 8 (eight) hours as needed for nausea or vomiting. (Patient not taking: Reported on 11/29/2023) 20 tablet 0   No current facility-administered medications for this visit.     Musculoskeletal:  Strength & Muscle Tone: within normal limits Gait & Station: normal Patient leans: N/A  Psychiatric Specialty Exam: Review of Systems  Blood pressure (!) 172/102, pulse (!) 110, height 6' (1.829 m), weight 256 lb (116.1 kg).Body mass index is 34.72 kg/m.  General Appearance: Fairly Groomed and long beard   Eye Contact:  Fair  Speech:   fast  Volume:  Increased  Mood:  Anxious and Depressed  Affect:  Constricted and Depressed  Thought Process:  Descriptions of Associations: Intact  Orientation:  Full (Time, Place, and Person)  Thought Content: Rumination   Suicidal Thoughts:  No  Homicidal Thoughts:  No  Memory:  Immediate;   Good Recent;   Good Remote;   Good  Judgement:  Fair  Insight:  Present  Psychomotor Activity:  Increased and Restlessness  Concentration:  Concentration: Good and Attention Span: Fair  Recall:  Good  Fund of Knowledge: Good  Language: Good  Akathisia:  No  Handed:  Right  AIMS (if indicated): not done  Assets:  Communication Skills Desire for Improvement Housing Social Support Talents/Skills Transportation  ADL's:  Intact  Cognition: WNL  Sleep:   Too much   Screenings: GAD-7    Advertising copywriter from 11/05/2023 in [facility] at [facility] from 10/22/2023 in [facility] at [facility] from 10/01/2023 in [facility] Out[facility] at [facility] from 09/10/2023 in [facility] at [facility] from 08/28/2023 in [facility] Out[facility] Behavior[facility] at [facility]  Total GAD-7 Score 2 1 1 5 2       PHQ2-9    Flowsheet Row Office Visit  from 11/29/2023 in [facility] from 11/26/2023 in [facility] at [facility] from 11/05/2023 in [facility] at [facility] from 10/22/2023 in [facility] at [facility] from 10/01/2023 in [facility] at [facility] Total Score 3 2 2 2  0  PHQ-9 Total Score 10 7 6 5 2       Flowsheet Row Office Visit from 11/29/2023 in [facility] from 10/01/2023 in [facility] at [facility] from 09/16/2023 in [facility] at [facility])  C-SSRS RISK CATEGORY No Risk No Risk No Risk        Assessment and Plan: Patient is 48 year old with history of low back pain, distant history of drug use came for an earlier appointment.  Discussed underlying psychosocial stressors.  Patient has done IOP and PHP in the past.  Recommend consider going back to group therapy and he agreed to look into it.  I will add low-dose Abilify 2 mg to help his regular mood lability and depression.  He will continue Lexapro 20 mg daily.  We will write a letter as patient not able to go back to work for past few days.  He will be out of work from March 21 and resume work on March 31 however if he decided group therapy then we will extend his disability days.  I encouraged to call us back if is any question concern or if he feels worsening of the symptoms.  Discussed Abilify side effects and benefits.  He is not taking hydroxyzine at this time due to excessive sleep.  Follow-up in a month.  Patient has appointment and I recommend to keep that appointment.  Collaboration of Care: Collaboration of  Care: Other provider involved in patient's care AEB notes are available in epic to review.  Patient/Guardian was advised Release of Information must be obtained  prior to any record release in order to collaborate their care with an outside provider. Patient/Guardian was advised if they have not already done so to contact the registration department to sign all necessary forms in order for [place] to release information regarding their care.   Consent: Patient/Guardian gives verbal consent for treatment and assignment of benefits for services provided during this visit. Patient/Guardian expressed understanding and agreed to proceed.   I provided 28 minutes face-to-face time during this encounter.  Cleotis Nipper, MD 11/29/2023, 3:08 PM

## 2023-12-04 ENCOUNTER — Telehealth (HOSPITAL_COMMUNITY): Payer: Self-pay | Admitting: Psychiatry

## 2023-12-06 ENCOUNTER — Telehealth (HOSPITAL_COMMUNITY): Payer: Self-pay | Admitting: *Deleted

## 2023-12-06 NOTE — Telephone Encounter (Signed)
 Ok to proceed.

## 2023-12-06 NOTE — Telephone Encounter (Signed)
 Pt dropped off FMLA paperwork to this office. Forms must be returned by 12/19/23. Ok to proceed?

## 2023-12-10 ENCOUNTER — Ambulatory Visit (HOSPITAL_COMMUNITY): Payer: 59 | Admitting: Licensed Clinical Social Worker

## 2023-12-11 ENCOUNTER — Ambulatory Visit (HOSPITAL_COMMUNITY): Admitting: Licensed Clinical Social Worker

## 2023-12-11 ENCOUNTER — Encounter (HOSPITAL_COMMUNITY): Payer: Self-pay | Admitting: Psychiatry

## 2023-12-11 ENCOUNTER — Other Ambulatory Visit (HOSPITAL_COMMUNITY): Attending: Family | Admitting: Family

## 2023-12-11 ENCOUNTER — Encounter (HOSPITAL_COMMUNITY): Payer: Self-pay

## 2023-12-11 DIAGNOSIS — F4312 Post-traumatic stress disorder, chronic: Secondary | ICD-10-CM

## 2023-12-11 DIAGNOSIS — F331 Major depressive disorder, recurrent, moderate: Secondary | ICD-10-CM

## 2023-12-11 DIAGNOSIS — F411 Generalized anxiety disorder: Secondary | ICD-10-CM

## 2023-12-11 NOTE — Progress Notes (Signed)
 Virtual Visit via Video Note  I connected with Micheal Roth on @TODAY @ at  9:00 AM EDT by a video enabled telemedicine application and verified that I am speaking with the correct person using two identifiers.  Location: Patient: at home Provider: at office   I discussed the limitations of evaluation and management by telemedicine and the availability of in person appointments. The patient expressed understanding and agreed to proceed.  I discussed the assessment and treatment plan with the patient. The patient was provided an opportunity to ask questions and all were answered. The patient agreed with the plan and demonstrated an understanding of the instructions.   The patient was advised to call back or seek an in-person evaluation if the symptoms worsen or if the condition fails to improve as anticipated.  I provided 30 minutes of non-face-to-face time during this encounter.   Micheal Roth, M.Ed,CNA   Patient ID: Micheal Roth, male   DOB: 16-Apr-1976, 48 y.o.   MRN: [id number] D:  Pt stepped down from virtual PHP to virtual MH-IOP on 06-19-23; completed on 07-12-23.  Pt reported that the groups in PHP and the groups in MH-IOP went well.  States he was back with his girlfriend.  She has agreed to seek couples counseling.  On a scale of 1-10 (10 being the worst), pt rated his depression at 2 and anxiety at a 5.  Denied SI/HI or A/V hallucinations.  Scored 17 on PHQ-9.  Reported that [employer] Disability has him out of work through 07-04-23.  Pt will possibly need an extension, if still in MH-IOP during that time.     Pt attended thirteen MH-IOP days.  Reported feeling much better.  "I feel like my old self now.  This has been the best experience I've had.  Group was very, very helpful."  On a scale of 1-10 (10 being the worst), pt rates his anxiety and depression at 0.  Denied SI/HI or A/V hallucinations.    Pt returned to virtual MH-IOP; referred by Micheal Roth; treatment for  worsening depression.  Pt c/o increased isolation, sadness, passive SI (denies a plan/intent), anhedonia, decreased sleep d/t he believes the Abilify, poor energy, low self-esteem.  Denies HI or A/V hallucinations. Scored #14 on the PHQ-9.  Pt requesting to f/u with Micheal Roth and Micheal Roth. Stressors include:  1) continued work stress 2) continued relationship issues  3) continued housing issues. A:  Re-oriented pt. Collaboration of Care: Collaborate with Dr. Carlyn Roth AEB, Micheal Jacks, NP AEB; Micheal Stain, Roth AEB and Micheal Roth AEB.  Strongly recommend groups through [facility] [employer] and [facility] services. Patient/Guardian was advised Release of Information must be obtained prior to any record release in order to collaborate their care with an outside provider. Patient/Guardian was advised if they have not already done so to contact the registration department to sign all necessary forms in order for [place] to release information regarding their care.  Consent: Patient/Guardian gives verbal consent for treatment and assignment of benefits for services provided during this visit. Patient/Guardian expressed understanding and agreed to proceed.   Pt will improve his mood as evidenced by being happy again, managing his mood and coping with daily stressors for 5 out of 7 days for 60 days.   R:  Pt receptive.   Micheal Roth, M.Ed,CNA

## 2023-12-11 NOTE — Progress Notes (Unsigned)
 Virtual Visit via Telephone Note  I connected with Micheal Roth on 12/11/23 at  9:00 AM EDT by telephone and verified that I am speaking with the correct person using two identifiers.  Location: Patient: Home  Provider:  Home Office    I discussed the limitations, risks, security and privacy concerns of performing an evaluation and management service by telephone and the availability of in person appointments. I also discussed with the patient that there may be a patient responsible charge related to this service. The patient expressed understanding and agreed to proceed.   I discussed the assessment and treatment plan with the patient. The patient was provided an opportunity to ask questions and all were answered. The patient agreed with the plan and demonstrated an understanding of the instructions.   The patient was advised to call back or seek an in-person evaluation if the symptoms worsen or if the condition fails to improve as anticipated.  I provided 20 minutes of non-face-to-face time during this encounter.   Oneta Rack, NP    Psychiatric Initial Adult Assessment   Patient Identification: Micheal Roth MRN:  [id number] Date of Evaluation:  12/11/2023 Referral Source: Psychiatrist Arfeen Chief Complaint: Social isolation depression and anxiety Visit Diagnosis:    ICD-10-CM   1. MDD (major depressive disorder), recurrent episode, moderate (HCC)  F33.1     2. Chronic post-traumatic stress disorder (PTSD)  F43.12     3. GAD (generalized anxiety disorder)  F41.1       History of Present Illness: Micheal Roth 48 year old Caucasian male presents after referral by his primary psychiatrist.  He reports multiple stressors that led to social isolation and mood irritability.  States he recently had to move in with his parents.  Has a states he had been living alone for the past 3 years and feels that sometimes he gets in his parents way and can be a burden on  them.  States his parents has not made him feel any type of way however he is very hard on his self.  States he is employed by the city of [employer] for a [employer].  States he is having trouble concentration and focus.  He reports he has followed up with therapist Cyril Loosen and psychiatrist.  Stated he has been taking his medications and tolerating them well.  He is currently prescribed Lexapro and was recently initiated on Abilify.  Denying any medication side effects at this time.  Patient to start intensive outpatient programming.     Associated Signs/Symptoms: Depression Symptoms:  depressed mood, difficulty concentrating, (Hypo) Manic Symptoms:  Distractibility, Anxiety Symptoms:  Excessive Worry, Psychotic Symptoms:  Hallucinations: None PTSD Symptoms: NA  Past Psychiatric History:  Previous Psychotropic Medications: Yes   Substance Abuse History in the last 12 months:  No.  Consequences of Substance Abuse: NA  Past Medical History:  Past Medical History:  Diagnosis Date   Anxiety    Arthritis    Asthma    sports   Chronic back pain    Chronic left shoulder pain    Chronic neck pain    Depression    GERD (gastroesophageal reflux disease)    h/o TOBACCO USER 05/10/2009   Qualifier: Diagnosis of   By: [employer]      Quit smoking cig 2 yrs ago - smoked for 20 yrs at 1.5ppd at most.   30 yr hx.   Still Smokes marijuana daily --> for 30-40 yrs. 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    Family Psychiatric History:   Family History:  Family History  Problem Relation Age of Onset   Diabetes Father    Diabetes Maternal Grandmother    Heart disease Maternal Grandmother        CHF   Heart attack Maternal Grandmother    Crohn's disease Brother    Colon cancer Neg Hx    Colon polyps Neg Hx    Esophageal cancer Neg Hx    Stomach cancer Neg Hx    Rectal cancer Neg Hx     Social History:   Social History   Socioeconomic History   Marital status: Single    Spouse name: Not on file   Number of children: 2   Years of education: Not on file   Highest education level: GED or equivalent  Occupational History   Not on file  Tobacco Use   Smoking status: Every Day    Current packs/day: 0.00    Average packs/day: 1 pack/day for 28.3 years (28.3 ttl pk-yrs)    Types: Cigarettes    Start date: 1995    Last attempt to quit: 01/02/2022    Years since quitting: 1.9   Smokeless tobacco: Former  Building services engineer status: Former   Substances: Nicotine  Substance and Sexual Activity   Alcohol use: Yes    Comment: Occassionally.   Drug use: Not Currently    Frequency: 2.0 times per week    Types: Marijuana   Sexual activity: Yes  Other Topics Concern   Not on file  Social History Narrative   Not on file   Social Drivers of Health   Financial Resource Strain: Not on file  Food Insecurity: Not on file  Transportation Needs: Not on file  Physical Activity: Not on file  Stress: Not on file  Social Connections: Not on file    Additional Social History: ***  Allergies:  No Known Allergies  Metabolic Disorder Labs: Lab Results  Component Value Date   HGBA1C 5.4 02/23/2017   No results found for: "PROLACTIN" Lab Results  Component Value Date   CHOL 177 02/23/2017   TRIG 175 (H) 02/23/2017   HDL 37 (L) 02/23/2017   CHOLHDL 4.8 02/23/2017   LDLCALC  105 (H) 02/23/2017   Lab Results  Component Value Date   TSH 2.304 07/26/2007    Therapeutic Level Labs: No results found for: "LITHIUM" No results found for: "CBMZ" No results found for: "VALPROATE"  Current Medications: Current Outpatient Medications  Medication Sig Dispense Refill   acetaminophen (TYLENOL) 500 MG tablet Take 500 mg by mouth every 6 (six) hours as needed.     albuterol (VENTOLIN HFA) 108 (90 Base) MCG/ACT inhaler Inhale 2 puffs into the lungs every 4 (  four) hours as needed for wheezing or shortness of breath. Last used: 0715-720 am today     ARIPiprazole (ABILIFY) 5 MG tablet Take 1/2 tab daily for one week and then full tab 30 tablet 0   escitalopram (LEXAPRO) 20 MG tablet Take 1 tablet (20 mg total) by mouth daily. 30 tablet 2   hydrOXYzine (ATARAX) 50 MG tablet Take 1-2 tablets (50-100 mg total) by mouth at bedtime as needed and may repeat dose one time if needed for anxiety. 45 tablet 1   ibuprofen (ADVIL) 200 MG tablet Take 200 mg by mouth every 6 (six) hours as needed.     omeprazole (PRILOSEC OTC) 20 MG tablet Take 20 mg by mouth daily before breakfast.     ondansetron (ZOFRAN-ODT) 4 MG disintegrating tablet Take 1 tablet (4 mg total) by mouth every 8 (eight) hours as needed for nausea or vomiting. 20 tablet 0   No current facility-administered medications for this visit.    Musculoskeletal: Strength & Muscle Tone: {desc; muscle tone:32375} Gait & Station: {PE GAIT ED ZOXW:96045} Patient leans: {Patient Leans:21022755}  Psychiatric Specialty Exam: Review of Systems  There were no vitals taken for this visit.There is no height or weight on file to calculate BMI.  General Appearance: {Appearance:22683}  Eye Contact:  {BHH EYE CONTACT:22684}  Speech:  {Speech:22685}  Volume:  {Volume (PAA):22686}  Mood:  {BHH MOOD:22306}  Affect:  {Affect (PAA):22687}  Thought Process:  {Thought Process (PAA):22688}  Orientation:  {BHH ORIENTATION (PAA):22689}   Thought Content:  {Thought Content:22690}  Suicidal Thoughts:  {ST/HT (PAA):22692}  Homicidal Thoughts:  {ST/HT (PAA):22692}  Memory:  {BHH MEMORY:22881}  Judgement:  {Judgement (PAA):22694}  Insight:  {Insight (PAA):22695}  Psychomotor Activity:  {Psychomotor (PAA):22696}  Concentration:  {Concentration:21399}  Recall:  {BHH GOOD/FAIR/POOR:22877}  Fund of Knowledge:{BHH GOOD/FAIR/POOR:22877}  Language: {BHH GOOD/FAIR/POOR:22877}  Akathisia:  {BHH YES OR NO:22294}  Handed:  {Handed:22697}  AIMS (if indicated):  {Desc; done/not:10129}  Assets:  {Assets (PAA):22698}  ADL's:  {BHH WUJ'W:11914}  Cognition: {chl bhh cognition:304700322}  Sleep:  {BHH GOOD/FAIR/POOR:22877}   Screenings: GAD-7    Advertising copywriter from 11/05/2023 in [facility] [place] Counselor from 10/22/2023 in [facility] from 10/01/2023 in [facility] [place] Counselor from 09/10/2023 in [facility] [place] Counselor from 08/28/2023 in [facility]  Total GAD-7 Score 2 1 1 5 2       PHQ2-9    Flowsheet Row Counselor from 12/11/2023 [facility] Visit from 11[facility]23 [facility]or from 11/26/2023 in [facility] [place] Counselor from 11/05/2023 in [facility] Counselor from 10/22/2023 in [facility] Total Score 5 3 2 2 2   PHQ-9 Total Score 14 10 7 6 5       Flowsheet Row Counselor from 12/11/2023 in [facility] Office Visit from 11/29/2023 in [facility] Counselor from 10/01/2023 in [facility] at [facility] RISK CATEGORY Error:  Question 6 not populated No Risk No Risk       Assessment and Plan: ***  Collaboration of Care: {BH OP Collaboration of Care:21014065}  Patient/Guardian was advised Release of Information must be obtained prior to any record release in order to collaborate their care with an outside provider. Patient/Guardian  was advised if they have not already done so to contact the registration department to sign all necessary forms in order for [place] to release information regarding their care.   Consent: Patient/Guardian gives verbal consent for treatment and assignment of benefits for services provided during this visit. Patient/Guardian expressed understanding and agreed to proceed.   Oneta Rack, NP 4/8/20253:09 PM

## 2023-12-11 NOTE — Progress Notes (Signed)
 Virtual Visit via Video Note   I connected with Micheal Roth on 12/11/23 at  9:00 AM EDT by a video enabled telemedicine application and verified that I am speaking with the correct person using two identifiers.   At orientation to the IOP program, Case Manager discussed the limitations of evaluation and management by telemedicine and the availability of in person appointments. The patient expressed understanding and agreed to proceed with virtual visits throughout the duration of the program.   Location:  Patient: Patient Home Provider: [facility]   History of Present Illness: MDD, PTSD, and GAD   Observations/Objective: Check In: Case Manager checked in with all participants to review discharge dates, insurance authorizations, work-related documents and needs from the treatment team regarding medications. Micheal Roth stated needs and engaged in discussion.    Initial Therapeutic Activity: Micheal Roth facilitated a check-in with Micheal Roth to assess for safety, sobriety and medication compliance.  Micheal Roth also inquired about Micheal Roth's current emotional ratings, as well as any significant changes in thoughts, feelings or behavior since previous check in.  Micheal Roth presented for session on time and was alert, oriented x5, with no evidence or self-report of active SI/HI or A/V H.  Micheal Roth reported compliance with medication and denied use of alcohol or illicit substances.  Micheal Roth reported scores of 7/10 for depression, 0/10 for anxiety, and 0/10 for anger/irritability.  Micheal Roth denied any recent outbursts or panic attacks.  Micheal Roth reported that a struggle has been dealing with erratic sleep patterns lately, including oversleeping, and most recently insomnia.  He reported that he has been recommended for a sleep study.  Micheal Roth reported that a success was attending a live music concert with his son.  Micheal Roth reported that his goal today is to feed a cat at his  previous house.          Second Therapeutic Activity: Micheal Roth introduced [employer], [place] Chaplain to provide psychoeducation on topic of Grief and Loss with members today.  Micheal Roth began discussion by checking in with the group about their baseline mood today, general thoughts on what grief means to them and how it has affected them personally in the past.  Micheal Roth provided information on how the process of grief/loss can differ depending upon one's unique culture, and categories of loss one could experience (i.e. loss of a person, animal, relationship, job, identity, etc).  Micheal Roth encouraged members to be mindful of how pervasive loss can be, and how to recognize signs which could indicate that this is having an impact on one's overall mental health and wellbeing.  Intervention was effective, as evidenced by Micheal Roth participating in discussion with speaker on the subject, reporting that his partner recently lost her father, which has been devastating for her emotionally.  Micheal Roth stated "Its like dealing with a new person.  I try to be there for her, but she takes it all out on me.  ".  Micheal Roth was receptive to empathetic feedback from chaplain on ways to supported a loved one through the grieving process while also maintaining healthy boundaries.     Third Therapeutic Activity: Micheal Roth introduced topic of anger management today.  Micheal Roth virtually shared a handout with members on this subject featuring a variety of coping skills, and facilitated discussion on these approaches.  Examples included raising awareness of anger triggers, practicing deep breathing, keeping an anger log to better understand episodes, using diversion activities to distract oneself for 30 minutes, taking a time out when necessary, and being mindful of warning signs  tied to thoughts or behavior.  Micheal Roth inquired about which techniques group members have used before, what has proved to be helpful, what their  unique warning signs might be, as well as what they will try out in the future to assist with de-escalation.  Intervention was effective, as evidenced by Micheal Roth participating in discussion on activity, and reporting that he has a history of anger problems, which have led to loss of relationships with friends and family, damage to property, lost jobs, and financial trouble.  Micheal Roth reported that his triggers include being treated unfairly, ignored, conflicts at work, financial problems and losing games.  Micheal Roth reported that warning signs include having trouble getting his words out, increased blood pressure and heartrate, ringing in ears, and exhaustion.  Micheal Roth reported that he will work to manage anger more effectively by using coping skills such as writing his feelings down in an anger journal, engaging in deep breathing, and taking time outs when needed to calm down.  He reported that this also led to increased insight into the role that previous head trauma might have on mood, so he plans to see a neurologist.  Assessment and Plan: Micheal Roth recommends that Micheal Roth remain in IOP treatment to better manage mental health symptoms, ensure stability and pursue completion of treatment plan goals. Micheal Roth recommends adherence to crisis/safety plan, taking medications as prescribed, and following up with medical professionals if any issues arise.    Follow Up Instructions: Micheal Roth will send Webex link for session tomorrow.  Micheal Roth was advised to call back or seek an in-person evaluation if the symptoms worsen or if the condition fails to improve as anticipated.   Collaboration of Care:   Medication Management AEB Micheal Jacks, NP or [name]                                          Case Manager AEB Micheal Modena, CNA    Patient/Guardian was advised Release of Information must be obtained prior to any record release in order to collaborate their care with an outside  provider. Patient/Guardian was advised if they have not already done so to contact the registration department to sign all necessary forms in order for [place] to release information regarding their care.    Consent: Patient/Guardian gives verbal consent for treatment and assignment of benefits for services provided during this visit. Patient/Guardian expressed understanding and agreed to proceed.   I provided 180 minutes of non-face-to-face time during this encounter.   Noralee Stain, LCSW, LCAS 12/11/23

## 2023-12-12 ENCOUNTER — Other Ambulatory Visit (HOSPITAL_COMMUNITY)

## 2023-12-12 DIAGNOSIS — F4312 Post-traumatic stress disorder, chronic: Secondary | ICD-10-CM

## 2023-12-12 DIAGNOSIS — F331 Major depressive disorder, recurrent, moderate: Secondary | ICD-10-CM

## 2023-12-12 DIAGNOSIS — F411 Generalized anxiety disorder: Secondary | ICD-10-CM

## 2023-12-12 NOTE — Progress Notes (Signed)
 Virtual Visit via Video Note   I connected with Micheal Roth on 12/12/23 at  9:00 AM EDT by a video enabled telemedicine application and verified that I am speaking with the correct person using two identifiers.   At orientation to the IOP program, Case Manager discussed the limitations of evaluation and management by telemedicine and the availability of in person appointments. The patient expressed understanding and agreed to proceed with virtual visits throughout the duration of the program.   Location:  Patient: Patient Home Provider: Home Office   History of Present Illness: MDD, PTSD, and GAD   Observations/Objective: Check In: Case Manager checked in with all participants to review discharge dates, insurance authorizations, work-related documents and needs from the treatment team regarding medications. Tanav stated needs and engaged in discussion.    Initial Therapeutic Activity: Counselor facilitated a check-in with Selig to assess for safety, sobriety and medication compliance.  Counselor also inquired about Sue's current emotional ratings, as well as any significant changes in thoughts, feelings or behavior since previous check in.  Travontae presented for session on time and was alert, oriented x5, with no evidence or self-report of active SI/HI or A/V H.  Leshaun reported compliance with medication and denied use of alcohol or illicit substances.  Dmitri reported scores of 7/10 for depression, 0/10 for anxiety, and 0/10 for anger/irritability.  Russ denied any recent outbursts or panic attacks.  Sagar reported that a struggle today was feeling 'down' this morning due to sleep issues.  Alberto reported that a success was running some errands yesterday to stay productive.  Jibri reported that his goal today is to get out of the house and look into rentals that might fit his budget.          Second Therapeutic Activity: Counselor  introduced topic of self-care today.  Counselor explained how this can be defined as the things one does to maintain good health and improve well-being.  Counselor provided members with a self-care assessment form to complete.  This handout featured various sub-categories of self-care, including physical, psychological/emotional, social, spiritual, and professional.  Members were asked to rank their engagement in the activities listed for each dimension on a scale of 1-3, with 1 indicating 'Poor', 2 indicating 'Ok', and 3 indicating 'Well'.  Counselor invited members to share results of their assessment, and inquired about which areas of self-care they are doing well in, as well as areas that require attention, and how they plan to begin addressing this during treatment.  Intervention was effective, as evidenced by [name] successfully completing initial 2 sections of assessment and actively engaging in discussion on subject, reporting that he is excelling in areas such as wearing clothes that make him feel good, eating regularly, attending preventative medical appointments, and doing comforting things, but would benefit from focusing more on areas such as exercising, eating healthier, participating in hobbies, learning new things, and going on vacations or daytrips.  Micai reported that he would work to improve self-care deficits by getting out of the house to play or walk with the dog 1-2x per week for exercise, adjusting diet to limit consumption of fatty or sugary foods, taking a cooking or blacksmith class to expand his skills and build confidence, or planning a camping trip with a friend soon.  Glendel stated "Self-care keeps me grounded and could keep me from going back into the depression".    Assessment and Plan: Counselor recommends that Izic remain in IOP treatment to better manage mental health  symptoms, ensure stability and pursue completion of treatment plan goals. Counselor  recommends adherence to crisis/safety plan, taking medications as prescribed, and following up with medical professionals if any issues arise.    Follow Up Instructions: Counselor will send Webex link for session tomorrow.  Dray was advised to call back or seek an in-person evaluation if the symptoms worsen or if the condition fails to improve as anticipated.   Collaboration of Care:   Medication Management AEB Hillery Jacks, NP or [name]                                          Case Manager AEB Jeri Modena, CNA    Patient/Guardian was advised Release of Information must be obtained prior to any record release in order to collaborate their care with an outside provider. Patient/Guardian was advised if they have not already done so to contact the registration department to sign all necessary forms in order for [place] to release information regarding their care.    Consent: Patient/Guardian gives verbal consent for treatment and assignment of benefits for services provided during this visit. Patient/Guardian expressed understanding and agreed to proceed.   I provided 180 minutes of non-face-to-face time during this encounter.   Noralee Stain, LCSW, LCAS 12/12/23

## 2023-12-13 ENCOUNTER — Other Ambulatory Visit (HOSPITAL_COMMUNITY): Admitting: Licensed Clinical Social Worker

## 2023-12-13 DIAGNOSIS — F4312 Post-traumatic stress disorder, chronic: Secondary | ICD-10-CM

## 2023-12-13 DIAGNOSIS — F411 Generalized anxiety disorder: Secondary | ICD-10-CM

## 2023-12-13 DIAGNOSIS — F331 Major depressive disorder, recurrent, moderate: Secondary | ICD-10-CM | POA: Diagnosis not present

## 2023-12-13 NOTE — Progress Notes (Signed)
 Virtual Visit via Video Note   I connected with Micheal Roth on 12/13/23 at  9:00 AM EDT by a video enabled telemedicine application and verified that I am speaking with the correct person using two identifiers.   At orientation to the IOP program, Case Manager discussed the limitations of evaluation and management by telemedicine and the availability of in person appointments. The patient expressed understanding and agreed to proceed with virtual visits throughout the duration of the program.   Location:  Patient: Patient Home Provider: OPT BH Office   History of Present Illness: MDD, PTSD, andGAD   Observations/Objective: Check In: Case Manager checked in with all participants to review discharge dates, insurance authorizations, work-related documents and needs from the treatment team regarding medications. Micheal Roth stated needs and engaged in discussion.    Initial Therapeutic Activity: Counselor facilitated a check-in with Micheal Roth to assess for safety, sobriety and medication compliance.  Counselor also inquired about Micheal Roth's current emotional ratings, as well as any significant changes in thoughts, feelings or behavior since previous check in.  Micheal Roth presented for session on time and was alert, oriented x5, with no evidence or self-report of active SI/HI or A/V H.  Micheal Roth reported compliance with medication and denied use of alcohol or illicit substances.  Micheal Roth reported scores of 6/10 for depression, 0/10 for anxiety, and 0/10 for anger/irritability.  Micheal Roth denied any recent outbursts or panic attacks.  Micheal Roth reported that an ongoing struggle has been getting consistent sleep throughout the night.  Micheal Roth reported that a success was grabbing a nice meal with his mother yesterday, as well as watching a hockey game.  Micheal Roth reported that his goal today is to run some errands outside the house before it starts raining.          Second  Therapeutic Activity: Counselor introduced topic of grounding skills today.  Counselor defined these as simple strategies one can use to help detach from difficult thoughts or feelings temporarily by focusing on something else.  Counselor noted that grounding will not solve the problem at hand, but can provide the practitioner with time to regain control over their thoughts and/or feelings and prevent the situation from getting worse (i.e. interrupting a panic attack).  Counselor divided these into three categories (mental, physical, and soothing) and then provided examples of each which group members could practice during session.  Some of these included describing one's environment in detail or playing a categories game with oneself for mental category, taking a hot bath/shower, stretching, or carrying a grounding object for physical category, and saying kind statements, or visualizing people one cares about for soothing category.  Counselor inquired about which techniques members have used with success in the past, or will commit to learning, practicing, and applying now to improve coping abilities.  Intervention was effective, as evidenced by Micheal Roth participating in discussion on the subject, trying out several of the techniques during session, and expressing interest in adding several to his available coping skills, such as describing his environment in detail, playing a categories game involving listing different NHL teams, imagining a memory of relaxing in a natural environment with his family like the beach, reading an engrossing graphic novel, thinking of something funny that can make him laugh, counting to 10, playing with a fidget spinner, splashing water on his face, clenching his fists, clicking a pen, practicing deep breathing, stretching, or telling himself coping statements such as "This is going to be a good day".  Assessment and Plan: Counselor recommends  that Micheal Roth remain in IOP  treatment to better manage mental health symptoms, ensure stability and pursue completion of treatment plan goals. Counselor recommends adherence to crisis/safety plan, taking medications as prescribed, and following up with medical professionals if any issues arise.    Follow Up Instructions: Counselor will send Webex link for session tomorrow.  Micheal Roth was advised to call back or seek an in-person evaluation if the symptoms worsen or if the condition fails to improve as anticipated.   Collaboration of Care:   Medication Management AEB Micheal Jacks, NP or Micheal Roth                                          Case Manager AEB Micheal Modena, CNA    Patient/Guardian was advised Release of Information must be obtained prior to any record release in order to collaborate their care with an outside provider. Patient/Guardian was advised if they have not already done so to contact the registration department to sign all necessary forms in order for [place] to release information regarding their care.    Consent: Patient/Guardian gives verbal consent for treatment and assignment of benefits for services provided during this visit. Patient/Guardian expressed understanding and agreed to proceed.   I provided 180 minutes of non-face-to-face time during this encounter.   Micheal Stain, LCSW, LCAS 12/13/23

## 2023-12-14 ENCOUNTER — Other Ambulatory Visit (HOSPITAL_COMMUNITY): Admitting: Psychiatry

## 2023-12-14 ENCOUNTER — Telehealth (HOSPITAL_COMMUNITY): Payer: Self-pay | Admitting: Psychiatry

## 2023-12-17 ENCOUNTER — Other Ambulatory Visit (HOSPITAL_COMMUNITY): Admitting: Licensed Clinical Social Worker

## 2023-12-17 DIAGNOSIS — F331 Major depressive disorder, recurrent, moderate: Secondary | ICD-10-CM | POA: Diagnosis not present

## 2023-12-17 DIAGNOSIS — F411 Generalized anxiety disorder: Secondary | ICD-10-CM

## 2023-12-17 DIAGNOSIS — F4312 Post-traumatic stress disorder, chronic: Secondary | ICD-10-CM

## 2023-12-17 MED ORDER — QUETIAPINE FUMARATE 50 MG PO TABS: 50.0000 mg | ORAL_TABLET | Freq: Every day | ORAL | 2 refills | Status: AC

## 2023-12-17 NOTE — Progress Notes (Signed)
 Virtual Visit via Video Note   I connected with Micheal Roth on 12/17/23 at  9:00 AM EDT by a video enabled telemedicine application and verified that I am speaking with the correct person using two identifiers.   At orientation to the IOP program, Case Manager discussed the limitations of evaluation and management by telemedicine and the availability of in person appointments. The patient expressed understanding and agreed to proceed with virtual visits throughout the duration of the program.   Location:  Patient: Patient Home Provider: Home Office   History of Present Illness: MDD, PTSD, and GAD   Observations/Objective: Check In: Case Manager checked in with all participants to review discharge dates, insurance authorizations, work-related documents and needs from the treatment team regarding medications. Micheal Roth stated needs and engaged in discussion.    Initial Therapeutic Activity: Counselor facilitated a check-in with Micheal Roth to assess for safety, sobriety and medication compliance.  Counselor also inquired about Micheal Roth's current emotional ratings, as well as any significant changes in thoughts, feelings or behavior since previous check in.  Micheal Roth presented for session on time and was alert, oriented x5, with no evidence or self-report of active SI/HI or A/V H.  Micheal Roth reported compliance with medication and denied use of alcohol or illicit substances.  Micheal Roth reported scores of 8/10 for depression, 2/10 for anxiety, and 2/10 for irritability.  Micheal Roth denied any recent outbursts or panic attacks.  Micheal Roth reported that a struggle was feeling ill last week, which led him to miss group.  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  Micheal Roth reported that his goal today is to take a nap since he is feeling very tired today.     Second Therapeutic Activity: Counselor discussed topic  of distress tolerance today with group.  Counselor shared virtual handout with members that offered a DBT approach represented by the acronym ACCEPTS, and outlined strategies for distracting oneself from distressing emotions, allowing appropriate time for these emotions to lesson in intensity and eventually fade away.  Strategies offered included engaging in positive activities, contributing to the wellbeing of others, comparing one's present situation to a previously difficult one to highlight resilience, using mental imagery, and physical grounding.  Counselor assisted members in creating their own realistic ACCEPTS plan for tackling a distressing emotion of their choice.  Intervention was effective, as evidenced by Micheal Roth participating in creation of the plan, choosing anger as his emotion of focus, and identifying several helpful approaches for distraction, such as engaging in positive self-talk, reflecting on past challenges he has overcome to highlight personal strengths, taking a run, picturing his children as a source of  comfort, or listening to music that can serve as a healthy outlet for his emotion.    Assessment and Plan: Counselor recommends that Micheal Roth remain in IOP treatment to better manage mental health symptoms, ensure stability and pursue completion of treatment plan goals. Counselor recommends adherence to crisis/safety plan, taking medications as prescribed, and following up with medical professionals if any issues arise.    Follow Up Instructions: Counselor will send Webex link for session tomorrow.  Micheal Roth was advised to call back or seek an in-person evaluation if the symptoms worsen or if the condition fails to improve as anticipated.   Collaboration of Care:   Medication Management AEB Micheal Jacks, NP or Dr. Carlyn Roth  Case Manager AEB Micheal Angelica, CNA    Patient/Guardian was advised Release of Information must be  obtained prior to any record release in order to collaborate their care with an outside provider. Patient/Guardian was advised if they have not already done so to contact the registration department to sign all necessary forms in order for us  to release information regarding their care.    Consent: Patient/Guardian gives verbal consent for treatment and assignment of benefits for services provided during this visit. Patient/Guardian expressed understanding and agreed to proceed.   I provided 180 minutes of non-face-to-face time during this encounter.   [name], [place], LCAS 12/17/23

## 2023-12-17 NOTE — Progress Notes (Signed)
 BH MD/PA/NP OP Progress Note  Virtual Visit via Telephone Note  I connected with Micheal Roth on 12/17/23 at  9:00 AM EDT by a video enabled telemedicine application and verified that I am speaking with the correct person using two identifiers.  Location: Patient: Home Provider: Office   I discussed the limitations, risks, security and privacy concerns of performing an evaluation and management service by telephone and the availability of in person appointments. I also discussed with the patient that there may be a patient responsible charge related to this service. The patient expressed understanding and agreed to proceed.   I discussed the assessment and treatment plan with the patient. The patient was provided an opportunity to ask questions and all were answered. The patient agreed with the plan and demonstrated an understanding of the instructions.   The patient was advised to call back or seek an in-person evaluation if the symptoms worsen or if the condition fails to improve as anticipated.  Marilou Showman, MD PGY-3  Name: Micheal Roth  MRN:  [id number]  Chief Complaint: medication management follow-up  HPI:  The patient is a 48 year old male with a past psychiatric history of major depressive disorder and PTSD.  He is currently enrolled in the intensive outpatient program for increasing depressive symptoms.  The patient is seen to discuss potential side effects from Abilify.  The patient reports experiencing numerous nighttime awakenings since starting the medication about 2 weeks ago.  The patient reports that he was experiencing hypersomnia before that time but that he feels the situation is currently intolerable.  We discussed transition to an alternative agent and he was agreeable.  The patient reports good mood, appetite, and sleep. He denies suicidal and homicidal thoughts. He denies side effects from his medications.  Review of systems as below.  Past  Psychiatric History:  No history of inpatient admission or suicide attempt.   Family Psychiatric History: none pertinent  Visit Diagnosis:    ICD-10-CM   1. MDD (major depressive disorder), recurrent episode, moderate (HCC)  F33.1     2. Chronic post-traumatic stress disorder (PTSD)  F43.12     3. GAD (generalized anxiety disorder)  F41.1       Past Medical History:  Past Medical History:  Diagnosis Date   Anxiety    Arthritis    Asthma    sports   Chronic back pain    Chronic left shoulder pain    Chronic neck pain    Depression    GERD (gastroesophageal reflux disease)    h/o TOBACCO USER 05/10/2009   Qualifier: Diagnosis of   By: Winona Haw      Quit smoking cig 2 yrs ago - smoked for 20 yrs at 1.5ppd at most.   30 yr hx.   Still Smokes marijuana daily --> for 30-40 yrs. 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    Family History:  Family History  Problem Relation Age of Onset   Diabetes Father    Diabetes Maternal Grandmother    Heart disease Maternal Grandmother        CHF   Heart attack Maternal Grandmother    Crohn's disease Brother    Colon cancer Neg Hx    Colon polyps Neg Hx    Esophageal cancer Neg Hx    Stomach cancer Neg Hx    Rectal cancer Neg Hx     Social History:  Social History   Socioeconomic History   Marital status: Single    Spouse name: Not on file    Number of children: 2   Years of education: Not on file   Highest education level: GED or equivalent  Occupational History   Not on file  Tobacco Use   Smoking status: Every Day    Current packs/day: 0.00    Average packs/day: 1 pack/day for 28.3 years (28.3 ttl pk-yrs)    Types: Cigarettes    Start date: 1995    Last attempt to quit: 01/02/2022    Years since quitting: 1.9   Smokeless tobacco: Former  Building services engineer status: Former   Substances: Nicotine  Substance and Sexual Activity   Alcohol use: Yes    Comment: Occassionally.   Drug use: Not Currently    Frequency: 2.0 times per week    Types: Marijuana   Sexual activity: Yes  Other Topics Concern   Not on file  Social History Narrative   Not on file   Social Drivers of Health   Financial Resource Strain: Not on file  Food Insecurity: Not on file  Transportation Needs: Not on file  Physical Activity: Not on file  Stress: Not on file  Social Connections: Not on file    Allergies:  No Known Allergies  Metabolic Disorder Labs: Lab Results  Component Value Date   HGBA1C 5.4 02/23/2017   No results found for: "PROLACTIN" Lab Results  Component Value Date   CHOL 177 02/23/2017   TRIG 175 (H) 02/23/2017   HDL 37 (L) 02/23/2017   CHOLHDL 4.8 02/23/2017   LDLCALC 105 (H) 02/23/2017   Lab Results  Component Value Date   TSH 2.304 07/26/2007    Therapeutic Level Labs: No results found for: "LITHIUM" No results found for: "VALPROATE" No results found for: "CBMZ"  Current Medications: Current Outpatient Medications  Medication Sig Dispense Refill   acetaminophen (TYLENOL) 500 MG tablet Take 500 mg by mouth every 6 (six) hours as needed.     albuterol (VENTOLIN HFA) 108 (90 Base) MCG/ACT inhaler Inhale 2 puffs into the lungs every 4 (four) hours as needed for wheezing or shortness of breath. Last used: 0715-720 am today     ARIPiprazole (ABILIFY) 5 MG tablet Take 1/2 tab daily for one week and then full  tab 30 tablet 0   escitalopram (LEXAPRO) 20 MG tablet Take 1 tablet (20 mg total) by mouth daily. 30 tablet 2   hydrOXYzine (ATARAX) 50 MG tablet Take 1-2 tablets (50-100 mg total) by mouth at bedtime as needed and may repeat dose one time if needed for anxiety. 45 tablet 1   ibuprofen (ADVIL) 200 MG tablet Take 200 mg by mouth every 6 (six) hours as needed.     omeprazole (PRILOSEC OTC) 20 MG tablet Take  20 mg by mouth daily before breakfast.     ondansetron (ZOFRAN-ODT) 4 MG disintegrating tablet Take 1 tablet (4 mg total) by mouth every 8 (eight) hours as needed for nausea or vomiting. 20 tablet 0   No current facility-administered medications for this visit.   Psychiatric Specialty Exam:  Physical Exam Constitutional:      Appearance: the patient is not toxic-appearing.  Pulmonary:     Effort: Pulmonary effort is normal.  Neurological:     General: No focal deficit present.     Mental Status: the patient is alert and oriented to person, place, and time.   Review of Systems  Respiratory:  Negative for shortness of breath.   Cardiovascular:  Negative for chest pain.  Gastrointestinal:  Negative for abdominal pain, constipation, diarrhea, nausea and vomiting.  Neurological:  Negative for headaches.      There were no vitals taken for this visit.  General Appearance: Fairly Groomed  Eye Contact:  Good  Speech:  Clear and Coherent  Volume:  Normal  Mood:  Euthymic  Affect:  Congruent  Thought Process:  Coherent  Orientation:  Full (Time, Place, and Person)  Thought Content: Logical   Suicidal Thoughts:  No  Homicidal Thoughts:  No  Memory:  Immediate;   Good  Judgement:  fair  Insight:  fair  Psychomotor Activity:  Normal  Concentration:  Concentration: Good  Recall:  Good  Fund of Knowledge: Good  Language: Good  Akathisia:  No  Handed:  not assessed  AIMS (if indicated): not done  Assets:  Communication Skills Desire for Improvement Financial  Resources/Insurance Housing Leisure Time Physical Health  ADL's:  Intact  Cognition: WNL         Screenings: GAD-7    Advertising copywriter from 11/05/2023 in [facility] [place] Counselor from 10/22/2023 in [facility] at [facility] from 10/01/2023 in [facility] from 09/10/2023 in [facility] [place] Counselor from 08/28/2023 in [facility] at [facility]  Total GAD-7 Score 2 1 1 5 2       PHQ2-9    Flowsheet Row Counselor from 12/11/2023 [facility] Visit from [facility] Counselor from 11[facility]26/2023 in [facility] [place] Counselor from 11/05/2023 in [facility] [place] Counselor from 10/22/2023 in [facility] at [facility] Total Score 5 3 2 2 2   PHQ-9 Total Score 14 10 7 6 5       Flowsheet Row Counselor from 12/11/2023 in [facility] Office Visit from 11/29/2023 in [facility] Counselor from 10/01/2023 in [facility] at [facility] RISK CATEGORY Error: Question 6 not populated No Risk No Risk       Assessment and Plan:  Continue Lexapro as prescribed Discontinue Abilify Start Seroquel 50 mg nightly    Collaboration of Care: none  Patient/Guardian was advised Release of Information must be obtained prior to any record release in order to collaborate their care with an outside provider. Patient/Guardian was advised if they have not already done so to contact the registration department to sign all necessary forms in order for [place] to release information regarding their care.   Consent: Patient/Guardian gives verbal  consent for treatment and assignment of benefits for services provided during this visit.  Patient/Guardian expressed understanding and agreed to proceed.    Marilou Showman, MD PGY-3

## 2023-12-18 ENCOUNTER — Other Ambulatory Visit (HOSPITAL_COMMUNITY): Admitting: Psychiatry

## 2023-12-18 ENCOUNTER — Telehealth (HOSPITAL_COMMUNITY): Payer: Self-pay | Admitting: Psychiatry

## 2023-12-19 ENCOUNTER — Other Ambulatory Visit (HOSPITAL_COMMUNITY): Admitting: Licensed Clinical Social Worker

## 2023-12-19 DIAGNOSIS — F411 Generalized anxiety disorder: Secondary | ICD-10-CM

## 2023-12-19 DIAGNOSIS — F4312 Post-traumatic stress disorder, chronic: Secondary | ICD-10-CM

## 2023-12-19 DIAGNOSIS — F331 Major depressive disorder, recurrent, moderate: Secondary | ICD-10-CM | POA: Diagnosis not present

## 2023-12-19 NOTE — Progress Notes (Signed)
 Virtual Visit via Video Note   I connected with Micheal Roth on 12/19/23 at  9:00 AM EDT by a video enabled telemedicine application and verified that I am speaking with the correct person using two identifiers.   At orientation to the IOP program, Case Manager discussed the limitations of evaluation and management by telemedicine and the availability of in person appointments. The patient expressed understanding and agreed to proceed with virtual visits throughout the duration of the program.   Location:  Patient: Patient Home Provider: Home Office   History of Present Illness: MDD, PTSD, and GAD   Observations/Objective: Check In: Case Manager checked in with all participants to review discharge dates, insurance authorizations, work-related documents and needs from the treatment team regarding medications. Azim stated needs and engaged in discussion.    Initial Therapeutic Activity: Counselor facilitated a check-in with Jerauld to assess for safety, sobriety and medication compliance.  Counselor also inquired about Cortland's current emotional ratings, as well as any significant changes in thoughts, feelings or behavior since previous check in.  Sheppard presented for session on time and was alert, oriented x5, with no evidence or self-report of active SI/HI or A/V H.  Blaike reported compliance with medication and denied use of alcohol or illicit substances.  Meer reported scores of 5/10 for depression, 0/10 for anxiety, and 2/10 for irritability.  Milo denied any recent outbursts or panic attacks.  Hagen reported that a struggle was getting poor sleep the night before, which led him to miss group.  Adriane reported that a success was sleeping better last night, and getting the chance to help his son with a task.  Mackay reported that his goal today is to get outside and play with his dog.            Second Therapeutic Activity: Counselor  discussed topic of gratitude journaling with members as a form of self-care.  Counselor virtually shared a handout with the group today which explained the benefits of this practice, including reduction in stress, increased happiness, and self-esteem.  Tips were also provided to aid in practice, such as taking time with entries, writing about people one is grateful for, and setting goal for two entries per week for at least 10-20 minutes at a time.  Counselor also provided group members with a variety of journaling prompts to choose from today, and encouraged each member to take time to write about something they are grateful for, with examples such as "Something beautiful I recently saw was..", "Something I can be proud of is.", "A reason to be excited for the future is." and more.  Members were encouraged to share their entry with the group, along with their perspective on the activity and motivation level towards making this a habit. Intervention was effective, as evidenced by [name] participating in journaling activity, and choosing the prompts "Someone whose company I enjoy is" and "Someone I can always rely upon is".  August expressed gratitude for his mother, who always encourages him to look at things through a different perspective.  He reported that he is also grateful for his children, who motivate him to be an active father.  Tanyon reported that he is also grateful for his stepfather, who he has been able to rely upon and given him guidance in assuming the role as an active father.  Nathanial reported that he found the journal prompts helpful, and this activity highlighted how positive his support network is, so he will try to make this  a regular self-care activity.      Third Therapeutic Activity: Counselor introduced Amado Bacon, [employer], to provide psychoeducation on topic of nutrition with members today.  Polly Brink virtually shared a comprehensive PowerPoint presentation to  guide discussion, which featured the various components of healthy living that influence one's wellbeing, including practicing mindfulness, staying physically active, calm in mood, well-rested, and more.  Polly Brink explained how good nutrition reinforces positive physical and mental health, and shared a video which explained how food intake affects brain functioning in particular.  Polly Brink provided advice on how to adjust diet in order to promote wellbeing during course of treatment, including achieving balanced daily intake along with regular exercise.  Polly Brink offered the 'plate method' as a tool for proper distribution of protein, grains, starches, vegetables, fruit, and low calorie drink, in addition to concept of 'mindful eating', and covering current Dietary Guidelines for Americans for more tips.  Polly Brink inquired about changes members would like to make to their nutrition in order to increase overall wellbeing based upon information shared today, and time was allowed to ask any questions they might have of Polly Brink regarding her specialty.  Intervention was effective, as evidenced by [name] participating in discussion with speaker on the subject, reporting that he is motivated to improve his diet during treatment to improve wellness.  He reported that he would like to cut back on soda and fried foods, and attempt to meal prep each week to avoid temptation to buy fast food.  He reported that he would also like to get more physical exercise, and has made an effort to play with his dog each day to accomplish this.  He was also observed participating in a stretching activity instructed by speaker to improve exercise/physical wellness.    Assessment and Plan: Counselor recommends that Tarance remain in IOP treatment to better manage mental health symptoms, ensure stability and pursue completion of treatment plan goals. Counselor recommends adherence to crisis/safety plan, taking medications as prescribed, and following  up with medical professionals if any issues arise.    Follow Up Instructions: Counselor will send Webex link for session tomorrow.  Zayquan was advised to call back or seek an in-person evaluation if the symptoms worsen or if the condition fails to improve as anticipated.   Collaboration of Care:   Medication Management AEB Dan Dun, NP or Dr. Marilou Showman                                          Case Manager AEB Molinda Angelica, CNA    Patient/Guardian was advised Release of Information must be obtained prior to any record release in order to collaborate their care with an outside provider. Patient/Guardian was advised if they have not already done so to contact the registration department to sign all necessary forms in order for us  to release information regarding their care.    Consent: Patient/Guardian gives verbal consent for treatment and assignment of benefits for services provided during this visit. Patient/Guardian expressed understanding and agreed to proceed.   I provided 180 minutes of non-face-to-face time during this encounter.   [name], [place], LCAS 12/19/23

## 2023-12-20 ENCOUNTER — Other Ambulatory Visit (HOSPITAL_COMMUNITY): Admitting: Licensed Clinical Social Worker

## 2023-12-20 DIAGNOSIS — F4312 Post-traumatic stress disorder, chronic: Secondary | ICD-10-CM

## 2023-12-20 DIAGNOSIS — F411 Generalized anxiety disorder: Secondary | ICD-10-CM

## 2023-12-20 DIAGNOSIS — F331 Major depressive disorder, recurrent, moderate: Secondary | ICD-10-CM

## 2023-12-20 NOTE — Progress Notes (Signed)
 Virtual Visit via Video Note   I connected with Micheal Roth on 12/20/23 at  9:00 AM EDT by a video enabled telemedicine application and verified that I am speaking with the correct person using two identifiers.   At orientation to the IOP program, Case Manager discussed the limitations of evaluation and management by telemedicine and the availability of in person appointments. The patient expressed understanding and agreed to proceed with virtual visits throughout the duration of the program.   Location:  Patient: Patient Home Provider: [facility]   History of Present Illness: MDD, PTSD, and GAD   Observations/Objective: Check In: Case Manager checked in with all participants to review discharge dates, insurance authorizations, work-related documents and needs from the treatment team regarding medications. Micheal Roth stated needs and engaged in discussion.    Initial Therapeutic Activity: Counselor facilitated a check-in with Micheal Roth to assess for safety, sobriety and medication compliance.  Counselor also inquired about Micheal Roth's current emotional ratings, as well as any significant changes in thoughts, feelings or behavior since previous check in.  Micheal Roth presented for session on time and was alert, oriented x5, with no evidence or self-report of active SI/HI or A/V H.  Micheal Roth reported compliance with medication and denied use of alcohol or illicit substances.  Micheal Roth reported scores of 5/10 for depression, 0/10 for anxiety, and 0/10 for anger/irritability.  Micheal Roth denied any recent outbursts or panic attacks.  Micheal Roth denied any new struggles.  Micheal Roth reported that a success was having a productive day yesterday, which involved playing with his pets, washing dishes, and making his bed.  Micheal Roth reported that his goal today is to go out with family to [facility] to get something to eat, or go golfing.           Second Therapeutic Activity:  Counselor introduced topic of self-esteem today and defined this as the value an individual places on oneself, based upon assessment of personal worth as a human being and approval/disapproval of one's behavior. Counselor asked members to assess their level of self-esteem at this time based upon common indicators of high self-esteem, including: accepting oneself unconditionally;  having self-respect and deep seated belief that one matters; being unaffected by other people's opinions/criticisms; and showing good control over emotions.  Counselor also explained concept of one's inner critic which serves to highlight faults and minimize strengths, directly influencing low sense of self-esteem.  Counselor then provided handout on 'strengths and qualities', which featured questions to guide discussion and increase awareness of each member's unique individual abilities which could reinforce higher self-esteem. Examples of questions included: 'things I am good at', 'challenges I have overcome', and 'what I like about myself'.  Intervention was effective, as evidenced by Micheal Roth actively engaging in discussion on topic, and completing a self-esteem assessment, receiving a score of 10, which indicated a 'diminished' level of self-esteem at this time due to traits such as putting down his own abilities and skills, feeling unworthy of love and respect, and being passive during interactions with others.  Micheal Roth stated "I think my score is spot on.  I'm usually a 'glass is half empty' kind of guy.  My self esteem is not what it should be".  Micheal Roth was receptive to several strategies offered today for increasing self-esteem during treatment, including setting boundaries through practice of assertive communication in order to protect himself from the negativity of certain people that bring him down at work, and including more time for self-care activities in his schedule such as  exercise.  Micheal Roth asked to leave  at 11:20am in order to assist his son, who called and informed him that he had a flat tire.    Assessment and Plan: Counselor recommends that Micheal Roth remain in IOP treatment to better manage mental health symptoms, ensure stability and pursue completion of treatment plan goals. Counselor recommends adherence to crisis/safety plan, taking medications as prescribed, and following up with medical professionals if any issues arise.    Follow Up Instructions: Counselor will send Webex link for session tomorrow.  Micheal Roth was advised to call back or seek an in-person evaluation if the symptoms worsen or if the condition fails to improve as anticipated.   Collaboration of Care:   Medication Management AEB Micheal Dun, NP or Dr. Marilou Showman                                          Case Manager AEB Micheal Angelica, CNA    Patient/Guardian was advised Release of Information must be obtained prior to any record release in order to collaborate their care with an outside provider. Patient/Guardian was advised if they have not already done so to contact the registration department to sign all necessary forms in order for us  to release information regarding their care.    Consent: Patient/Guardian gives verbal consent for treatment and assignment of benefits for services provided during this visit. Patient/Guardian expressed understanding and agreed to proceed.   I provided 140 minutes of non-face-to-face time during this encounter.   [name], [place], LCAS 12/20/23

## 2023-12-21 ENCOUNTER — Other Ambulatory Visit (HOSPITAL_COMMUNITY): Admitting: Licensed Clinical Social Worker

## 2023-12-21 DIAGNOSIS — F331 Major depressive disorder, recurrent, moderate: Secondary | ICD-10-CM

## 2023-12-21 DIAGNOSIS — F4312 Post-traumatic stress disorder, chronic: Secondary | ICD-10-CM

## 2023-12-21 DIAGNOSIS — F411 Generalized anxiety disorder: Secondary | ICD-10-CM

## 2023-12-21 NOTE — Progress Notes (Signed)
 Virtual Visit via Video Note   I connected with Micheal Roth on 12/21/23 at  9:00 AM EDT by a video enabled telemedicine application and verified that I am speaking with the correct person using two identifiers.   At orientation to the IOP program, Case Manager discussed the limitations of evaluation and management by telemedicine and the availability of in person appointments. The patient expressed understanding and agreed to proceed with virtual visits throughout the duration of the program.   Location:  Patient: Patient Home Provider: Home Office   History of Present Illness: MDD, PTSD, and GAD   Observations/Objective: Check In: Case Manager checked in with all participants to review discharge dates, insurance authorizations, work-related documents and needs from the treatment team regarding medications. Micheal Roth stated needs and engaged in discussion.    Initial Therapeutic Activity: Counselor facilitated a check-in with Micheal Roth to assess for safety, sobriety and medication compliance.  Counselor also inquired about Micheal Roth's current emotional ratings, as well as any significant changes in thoughts, feelings or behavior since previous check in.  Micheal Roth presented for session on time and was alert, oriented x5, with no evidence or self-report of active SI/HI or A/V H.  Micheal Roth reported compliance with medication and denied use of alcohol or illicit substances.  Micheal Roth reported scores of 4/10 for depression, 0/10 for anxiety, and 2/10 for irritability.  Micheal Roth denied any recent outbursts or panic attacks.  Micheal Roth reported that a struggle was having to leave group early in order to help his son with a flat tire.  Micheal Roth reported that a success was spending quality time with his family after assisting with this issue.  Micheal Roth reported that his goal today is to spend more time with family, and plan for the Easter holiday.          Second  Therapeutic Activity: Counselor covered topic of core beliefs with group today.  Counselor virtually shared a handout on the subject, which explained how everyone looks at the world differently, and two people can have the same experience, but have different interpretations of what happened.  Members were encouraged to think of these like sunglasses with different "shades" influencing perception towards positive or negative outcomes.  Examples of negative core beliefs were provided, such as "I'm unlovable", "I'm not good enough", and "I'm a bad person".  Members were asked to share which one(s) they could relate to, and then identify evidence which contradicts these beliefs.  Counselor also provided psychoeducation on positive affirmations today.  Counselor explained how these are positive statements which can be spoken out loud or recited mentally to challenge negative thoughts and/or core beliefs to improve mood and outlook each day.  Counselor shared a comprehensive list of affirmations virtually to members with different categories, including ones for health, confidence, success, and happiness.  Counselor invited members to look through this list and identify any which resonated with them, and practice saying them out loud with sincerity.  Intervention was effective, as evidenced by Micheal Roth successfully participating in discussion on the subject and reporting that he could relate to several negative core beliefs listed on the handout, such as "People can't be trusted", "I am a failure", "I am unworthy", "I am a loser", "I am trapped", and "No one likes me".  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  Micheal Roth also reported that he liked several of the positive affirmations listed, such as "I embrace my flaws because I know nobody is perfect", "This too shall pass", and "Failure is great feedback".  Micheal Roth stated "I feel a whole lot better".   Assessment and Plan: Counselor recommends that Micheal remain in IOP treatment to better manage mental health symptoms, ensure stability and pursue completion of treatment plan goals. Counselor recommends adherence to crisis/safety plan, taking medications as prescribed, and following up with medical professionals if any issues arise.    Follow Up Instructions: Counselor will send Webex link for session tomorrow.  Micheal Roth was advised to call back or seek an in-person evaluation if the symptoms worsen or if the condition fails to improve as anticipated.   Collaboration of Care:   Medication Management AEB Staci Kerns, NP or Dr. Karleen Kaufmann                                          Case Manager AEB Ricka Gaskins, CNA    Patient/Guardian was advised Release of Information must be obtained prior to any record release in order to collaborate their care with an outside provider. Patient/Guardian was advised if they have not already done so to contact the registration department to sign all necessary forms in order for us  to release information regarding their care.    Consent: Patient/Guardian gives verbal consent for treatment and assignment of benefits for services provided during this visit. Patient/Guardian expressed understanding and agreed to proceed.   I provided 170 minutes of non-face-to-face time during this encounter.   Darleene Ricker, [place], LCAS 12/21/23

## 2023-12-24 ENCOUNTER — Telehealth (HOSPITAL_COMMUNITY): Payer: 59 | Admitting: Psychiatry

## 2023-12-24 ENCOUNTER — Other Ambulatory Visit (HOSPITAL_COMMUNITY): Admitting: Licensed Clinical Social Worker

## 2023-12-24 DIAGNOSIS — F331 Major depressive disorder, recurrent, moderate: Secondary | ICD-10-CM

## 2023-12-24 DIAGNOSIS — F4312 Post-traumatic stress disorder, chronic: Secondary | ICD-10-CM

## 2023-12-24 DIAGNOSIS — F411 Generalized anxiety disorder: Secondary | ICD-10-CM

## 2023-12-24 MED ORDER — ESCITALOPRAM OXALATE 20 MG PO TABS
20.0000 mg | ORAL_TABLET | Freq: Every day | ORAL | 2 refills | Status: AC
Start: 2023-12-24 — End: 2024-03-23

## 2023-12-24 NOTE — Progress Notes (Signed)
 Virtual Visit via Video Note   I connected with Micheal Roth on 12/24/23 at  9:00 AM EDT by a video enabled telemedicine application and verified that I am speaking with the correct person using two identifiers.   At orientation to the IOP program, Case Manager discussed the limitations of evaluation and management by telemedicine and the availability of in person appointments. The patient expressed understanding and agreed to proceed with virtual visits throughout the duration of the program.   Location:  Patient: [facility] Home Provider: [facility]   History of Present Illness: MDD, PTSD, and GAD   Observations/Objective: Check In: Case Manager checked in with all participants to review discharge dates, insurance authorizations, work-related documents and needs from the treatment team regarding medications. Micheal Roth stated needs and engaged in discussion.    Initial Therapeutic Activity: Counselor facilitated a check-in with Micheal Roth to assess for safety, sobriety and medication compliance.  Counselor also inquired about Micheal Roth's current emotional ratings, as well as any significant changes in thoughts, feelings or behavior since previous check in.  Micheal Roth presented for session on time and was alert, oriented x5, with no evidence or self-report of active SI/HI or A/V H.  Micheal Roth reported compliance with medication and denied use of alcohol or illicit substances.  Micheal Roth reported scores of 4/10 for depression, 0/10 for anxiety, and 4/10 for irritability.  Micheal Roth denied any recent outbursts or panic attacks.  Micheal Roth reported that a success was spending time with family over the weekend for the Easter holiday.  Micheal Roth reported that a struggle was having a disagreement with his ex, who was upset that she didn't get invited to the family gathering.  Micheal Roth reported that his goal today is to get some rest since he is "Wore out" from the busy weekend.     Second Therapeutic Activity: Counselor discussed topic of sleep hygiene today with group.  Counselor defined this as the habits, behaviors and environmental factors that can be adjusted to improve overall sleep quality.  Counselor also discussed how lack of consistent sleep can negatively affect mood and overall mental health.  Counselor provided members with a screening to complete assessing typical barriers one might face in achieving quality sleep at night (i.e. struggling to get up in the morning, waking up throughout the night, tossing/turning, etc), and inquired about members' perception of sleep hygiene at present.  Counselor offered techniques to members via a virtual handout which could be implemented in order to improve sleep hygiene, such as sticking to a normal morning/night routine, avoiding using of electronics too close to bedtime, avoiding heavy meals before bed, using a sleep journal to track changes and address anxious thoughts, as well as avoiding naps during the daytime, ensuring proper use of medications if prescribed any by provider(s), and more.  Counselor inquired about changes members intend to make to sleep hygiene based upon information covered today.  Interventions were effective, as evidenced by Micheal Roth actively participating in discussion on subject, reporting that he has been experiencing poor quality sleep for weeks, and just recently started averaging 8 hours once his medication was adjusted.  Micheal Roth also completed a sleep assessment, which revealed that he is currently sleep deprived.  Micheal Roth reported that he plans to improve sleep hygiene over the course of treatment by getting into a regular sleep/wake routine, avoiding use of nicotine or caffeine close to bedtime, avoid spending too much time isolated in the bedroom during the daytime, cutting back on daytime naps, stop watching TV too close  to bedtime, and avoid watching the clock at night.  He reported that he  will also ask his PCP about scheduling a sleep study due to long running history of snoring and gasping for breath throughout the night.     Assessment and Plan: Counselor recommends that Micheal Roth remain in IOP treatment to better manage mental health symptoms, ensure stability and pursue completion of treatment plan goals. Counselor recommends adherence to crisis/safety plan, taking medications as prescribed, and following up with medical professionals if any issues arise.    Follow Up Instructions: Counselor will send Webex link for session tomorrow.  Micheal Roth was advised to call back or seek an in-person evaluation if the symptoms worsen or if the condition fails to improve as anticipated.   Collaboration of Care:   Medication Management AEB Micheal Dun, NP or Dr. [name]                                          Case Manager AEB Micheal Angelica, CNA    Patient/Guardian was advised Release of Information must be obtained prior to any record release in order to collaborate their care with an outside provider. Patient/Guardian was advised if they have not already done so to contact the registration department to sign all necessary forms in order for us  to release information regarding their care.    Consent: Patient/Guardian gives verbal consent for treatment and assignment of benefits for services provided during this visit. Patient/Guardian expressed understanding and agreed to proceed.   I provided 180 minutes of non-face-to-face time during this encounter.   Micheal [place], LCSW, LCAS 12/24/23

## 2023-12-25 ENCOUNTER — Ambulatory Visit (HOSPITAL_COMMUNITY): Payer: 59 | Admitting: Licensed Clinical Social Worker

## 2023-12-25 ENCOUNTER — Other Ambulatory Visit (HOSPITAL_COMMUNITY): Admitting: Licensed Clinical Social Worker

## 2023-12-25 DIAGNOSIS — F331 Major depressive disorder, recurrent, moderate: Secondary | ICD-10-CM | POA: Diagnosis not present

## 2023-12-25 DIAGNOSIS — F411 Generalized anxiety disorder: Secondary | ICD-10-CM

## 2023-12-25 DIAGNOSIS — F4312 Post-traumatic stress disorder, chronic: Secondary | ICD-10-CM
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
## 2023-12-26 ENCOUNTER — Other Ambulatory Visit (HOSPITAL_COMMUNITY): Admitting: Psychiatry

## 2023-12-26 DIAGNOSIS — F331 Major depressive disorder, recurrent, moderate: Secondary | ICD-10-CM | POA: Diagnosis not present

## 2023-12-26 DIAGNOSIS — F411 Generalized anxiety disorder: Secondary | ICD-10-CM

## 2023-12-26 DIAGNOSIS — F4312 Post-traumatic stress disorder, chronic: Secondary | ICD-10-CM
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
## 2023-12-27 ENCOUNTER — Other Ambulatory Visit (HOSPITAL_COMMUNITY): Admitting: Psychiatry

## 2023-12-27 DIAGNOSIS — F4312 Post-traumatic stress disorder, chronic: Secondary | ICD-10-CM

## 2023-12-27 DIAGNOSIS — F411 Generalized anxiety disorder: Secondary | ICD-10-CM

## 2023-12-27 DIAGNOSIS — F331 Major depressive disorder, recurrent, moderate: Secondary | ICD-10-CM | POA: Diagnosis not present

## 2023-12-27 NOTE — Patient Instructions (Signed)
 D:  Pt will complete virtual MH-IOP on 12-31-23.  A:  Discharge on 12-31-23.  Follow up with Dr. Arfeen on 01-21-24 @ 9:40 a.m (virtual) and Rosalynd Combs, LCSW on 01-02-24 @ 2pm (in person).  Return to work on 01-03-24; without any restrictions.  Strongly encourage support groups through [facility] [employer] [facility] [phone].  R:  Patient receptive.
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
## 2023-12-28 ENCOUNTER — Encounter (HOSPITAL_COMMUNITY): Payer: Self-pay | Admitting: Psychiatry

## 2023-12-28 ENCOUNTER — Other Ambulatory Visit (HOSPITAL_COMMUNITY): Admitting: Psychiatry

## 2023-12-28 DIAGNOSIS — F331 Major depressive disorder, recurrent, moderate: Secondary | ICD-10-CM

## 2023-12-28 DIAGNOSIS — F411 Generalized anxiety disorder: Secondary | ICD-10-CM

## 2023-12-28 DIAGNOSIS — F4312 Post-traumatic stress disorder, chronic: Secondary | ICD-10-CM
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
## 2023-12-28 NOTE — Progress Notes (Signed)
 Virtual Visit via Video Note  I connected with Micheal Roth on @TODAY @ at  9:00 AM EDT by a video enabled telemedicine application and verified that I am speaking with the correct person using two identifiers.  Location: Patient: at home Provider: at home office   I discussed the limitations of evaluation and management by telemedicine and the availability of in person appointments. The patient expressed understanding and agreed to proceed.   I discussed the assessment and treatment plan with the patient. The patient was provided an opportunity to ask questions and all were answered. The patient agreed with the plan and demonstrated an understanding of the instructions.   The patient was advised to call back or seek an in-person evaluation if the symptoms worsen or if the condition fails to improve as anticipated.  I provided 20 minutes of non-face-to-face time during this encounter.   Molinda Angelica, M.Ed,CNA   Patient ID: Micheal Roth, male   DOB: Jun 24, 1976, 48 y.o.   MRN: [id number] D:  As per patient's previous admit note states: "Pt stepped down from virtual PHP to virtual MH-IOP on 06-19-23; completed on 07-12-23.  Pt reported that the groups in PHP and the groups in MH-IOP went well.  States he was back with his girlfriend.  She has agreed to seek couples counseling.  On a scale of 1-10 (10 being the worst), pt rated his depression at 2 and anxiety at a 5.  Denied SI/HI or A/V hallucinations.  Scored 17 on PHQ-9.  RPt had attended thirteen MH-IOP days.  Reported feeling much better.  "I feel like my old self now.  This has been the best experience I've had.  Group was very, very helpful."  On a scale of 1-10 (10 being the worst), pt rates his anxiety and depression at 0.  Denied SI/HI or A/V hallucinations.     Pt returned to virtual MH-IOP; referred by Dr. Arfeen; treatment for worsening depression.  Pt c/o increased isolation, sadness, passive SI (denies a plan/intent),  anhedonia, decreased sleep d/t he believes the Abilify , poor energy, low self-esteem.  Denies HI or A/V hallucinations. Scored #14 on the PHQ-9.  Pt requesting to f/u with Dr. Arfeen and Rosalynd Combs, LCSW. Stressors include:  1) continued work stress 2) continued relationship issues  3) continued housing issues."  Pt will discharge on 12-31-23.  Reports he is doing "good."  States the groups were very helpful.  On a scale of 1-10 (10 being the worst); pt rates his depression at a 4 and anxiety at a 0.  Denies SI/HI or A/V hallucinations.  C/O still awakening, a little sadness, states he's maintaining structure and not isolating.  Plans to rtw on 01-03-24; without any restrictions.  A:  Will d/c as planned on 12-31-23. F/U with Dr. Arfeen on 01-21-24 @ 9:40 a.m (virtual) and Rosalynd Combs, LCSW on 01-02-24 @ 2pm (in person). Collaboration of Care: Collaborate with Dr. Marilou Showman AEB, Dan Dun, NP AEB; [name], LCSW AEB and Rosalynd Combs, LCSW AEB.  Strongly recommend groups through [facility] and [facility] services. Patient/Guardian was advised Release of Information must be obtained prior to any record release in order to collaborate their care with an outside provider. Patient/Guardian was advised if they have not already done so to contact the registration department to sign all necessary forms in order for us  to release information regarding their care.  Consent: Patient/Guardian gives verbal consent for treatment and assignment of benefits for services provided  during this visit. Patient/Guardian expressed understanding and agreed to proceed.  R:  Pt receptive.  Molinda Angelica, M.Ed,CNA

## 2023-12-31 ENCOUNTER — Encounter (HOSPITAL_COMMUNITY): Payer: Self-pay | Admitting: Family

## 2023-12-31 ENCOUNTER — Other Ambulatory Visit (HOSPITAL_COMMUNITY): Admitting: Family

## 2023-12-31 DIAGNOSIS — F411 Generalized anxiety disorder: Secondary | ICD-10-CM

## 2023-12-31 DIAGNOSIS — F331 Major depressive disorder, recurrent, moderate: Secondary | ICD-10-CM | POA: Diagnosis not present

## 2023-12-31 DIAGNOSIS — F4312 Post-traumatic stress disorder, chronic: Secondary | ICD-10-CM

## 2023-12-31 NOTE — Progress Notes (Signed)
 Virtual Visit via Video Note   I connected with Micheal Roth Roth on 12/31/23 at  9:00 AM EDT by a video enabled telemedicine application and verified that I am speaking with the correct person using two identifiers.   At orientation to the IOP program, Case Manager discussed the limitations of evaluation and management by telemedicine and the availability of in person appointments. The patient expressed understanding and agreed to proceed with virtual visits throughout the duration of the program.   Location:  Patient: Patient Home Provider: Home Office   History of Present Illness: MDD, PTSD, and GAD   Observations/Objective: Check In: Case Manager checked in with all participants to review discharge dates, insurance authorizations, work-related documents and needs from the treatment team regarding medications. Micheal Roth stated needs and engaged in discussion.    Initial Therapeutic Activity: Counselor facilitated a check-in with Micheal Roth to assess for safety, sobriety and medication compliance.  Counselor also inquired about Micheal Roth's current emotional ratings, as well as any significant changes in thoughts, feelings or behavior since previous check in.  Micheal Roth Roth presented for session on time and was alert, oriented x5, with no evidence or self-report of active SI/HI or A/V H.  Micheal Roth reported compliance with medication and denied use of alcohol or illicit substances.  Micheal Roth Roth reported scores of 4/10 for depression, 0/10 for anxiety, and 3/10 for irritability.  Micheal Roth Roth denied any recent outbursts or panic attacks.  [name] reported that a struggle was feeling 'blah' yesterday after having to talk to his ex.  Micheal Roth reported that a success was spending time with a supportive friend on Saturday.  Micheal Roth reported that his goal today is to treat himself to some ice cream as a reward for completing MHIOP.          Second Therapeutic Activity: Counselor  introduced topic of assertive communication today.  Counselor shared various handouts with members virtually in group to read along with on the subject.  These handouts defined assertive communication as a communication style in which a person stands up for their own needs and wants, while also taking into consideration the needs and wants of others, without behaving in a passive or aggressive way.  Traits of assertive communicators were highlighted such as using appropriate speaking volume, maintaining eye contact, using confident language, and avoiding interruption.  Members were also provided with tips on how to improve communication, including respecting oneself, expressing thoughts and feelings calmly, and saying "No" when necessary.  Members were given a variety of scenarios where they could practice using these tips to respond in an assertive manner.  Intervention was effective, as evidenced by Micheal Roth Roth participating in discussion on topic, reporting that he has a passive aggressive communication style due to traits such as lacking confidence, not expressing wants and needs, becoming easily frustrated, unwilling to compromise and showing disrespect toward others.  Micheal Roth Roth reported that this has led to issues such as having ongoing disagreements with his ex, who tends to be a trigger for him and hard to communicate with.  Micheal Roth Roth showed more effective use of assertive communication skills through engagement in roleplay activities.    Assessment and Plan: Counselor recommends that Micheal Roth remain in IOP treatment to better manage mental health symptoms, ensure stability and pursue completion of treatment plan goals. Counselor recommends adherence to crisis/safety plan, taking medications as prescribed, and following up with medical professionals if any issues arise.    Follow Up Instructions: Counselor will send Webex link for session tomorrow.  Micheal Roth was advised to call  back or seek  an in-person evaluation if the symptoms worsen or if the condition fails to improve as anticipated.   Collaboration of Care:   Medication Management AEB Dan Dun, NP or Dr. Marilou Showman                                          Case Manager AEB Molinda Angelica, CNA    Patient/Guardian was advised Release of Information must be obtained prior to any record release in order to collaborate their care with an outside provider. Patient/Guardian was advised if they have not already done so to contact the registration department to sign all necessary forms in order for us  to release information regarding their care.    Consent: Patient/Guardian gives verbal consent for treatment and assignment of benefits for services provided during this visit. Patient/Guardian expressed understanding and agreed to proceed.   I provided 180 minutes of non-face-to-face time during this encounter.   [name], LCSW, LCAS 12/31/23

## 2023-12-31 NOTE — Progress Notes (Signed)
 Virtual Visit via Telephone Note  I connected with Micheal Roth on 12/31/23 at  9:00 AM EDT by telephone and verified that I am speaking with the correct person using two identifiers.  Location: Patient: Home Provider: Office   I discussed the limitations, risks, security and privacy concerns of performing an evaluation and management service by telephone and the availability of in person appointments. I also discussed with the patient that there may be a patient responsible charge related to this service. The patient expressed understanding and agreed to proceed.   I discussed the assessment and treatment plan with the patient. The patient was provided an opportunity to ask questions and all were answered. The patient agreed with the plan and demonstrated an understanding of the instructions.   The patient was advised to call back or seek an in-person evaluation if the symptoms worsen or if the condition fails to improve as anticipated.  I provided 10 minutes of non-face-to-face time during this encounter.   Levester Reagin, NP   [facility] Intensive Outpatient Program Discharge Summary  Micheal Roth [id number]  Admission date: 12/11/2023 Discharge date: 12/31/2023  Reason for admission: Per admission assessment note: " Micheal Roth 48 year old Caucasian male presents after referral by his primary psychiatrist.  He reports multiple stressors that led to social isolation and mood irritability.  States he recently had to move in with his parents.  Has a states he had been living alone for the past 3 years and feels that sometimes he gets in his parents way and can be a burden on them.  States his parents has not made him feel any type of way however he is very hard on his self.  States he is employed by the city of [employer] for a [employer].  States he is having trouble concentration and focus.  He reports he has followed up with therapist Rosalynd Combs and  psychiatrist.  Stated he has been taking his medications and tolerating them well.  He is currently prescribed Lexapro  and was recently initiated on Abilify .  Denying any medication side effects at this time.  Patient to start Intensive outpatient programming (IOP).    Progress in Program Toward Treatment Goals: Progressing patient attended and participated with daily group session with active engagement with the patient.  This is patient's second admission to the intensive outpatient programming.  States he felt like he learned a lot more second time around.  Denies any suicidal or homicidal ideations.  Denies auditory visual hallucinations at discharge.  States he has plans to follow-up with attending psychiatrist Arfeen and therapist Rosalynd Combs.  Denied any medication refills at this visit.  Support encouragement reassurance was provided.  Progress (rationale): Take all of you medications as prescribed by your mental healthcare provider.  Report any adverse effects and reactions from your medications to your outpatient provider promptly.  Do not engage in alcohol and or illegal drug use while on prescription medicines. Keep all scheduled appointments. This is to ensure that you are getting refills on time and to avoid any interruption in your medication.  If you are unable to keep an appointment call to reschedule.  Be sure to follow up with resources and follow ups given. In the event of worsening symptoms call the crisis hotline, 911, and or go to the nearest emergency department for appropriate evaluation and treatment of symptoms. Follow-up with your primary care provider for your medical issues, concerns and or health care needs.  Collaboration of Care: Medication Management AEB recently initiated on Abilify   Patient/Guardian was advised Release of Information must be obtained prior to any record release in order to collaborate their care with an outside provider. Patient/Guardian was  advised if they have not already done so to contact the registration department to sign all necessary forms in order for us  to release information regarding their care.   Consent: Patient/Guardian gives verbal consent for treatment and assignment of benefits for services provided during this visit. Patient/Guardian expressed understanding and agreed to proceed.   Dan Dun NP 12/31/2023

## 2024-01-02 ENCOUNTER — Encounter (HOSPITAL_COMMUNITY): Payer: Self-pay

## 2024-01-02 ENCOUNTER — Ambulatory Visit (INDEPENDENT_AMBULATORY_CARE_PROVIDER_SITE_OTHER): Admitting: Licensed Clinical Social Worker

## 2024-01-02 ENCOUNTER — Telehealth (HOSPITAL_COMMUNITY): Admitting: Psychiatry

## 2024-01-02 DIAGNOSIS — F331 Major depressive disorder, recurrent, moderate: Secondary | ICD-10-CM | POA: Insufficient documentation

## 2024-01-02 NOTE — Progress Notes (Signed)
 THERAPIST PROGRESS NOTE   Session Date: 01/02/2024  Session Time: 1415 - 1501  Participation Level: Active  Behavioral Response: CasualAlertDepressed and Euthymic  Type of Therapy: Individual Therapy  Treatment Goals addressed:  - LTG: "Try to make sure I can deal with the depression as it comes" - LTG: Reduce frequency, intensity, and duration of depression symptoms so that daily functioning is improved - LTG: Increase coping skills to manage depression and improve ability to perform daily activities - STG: Soumil will reduce frequency of avoidant behaviors by 50% as evidenced by self-report in therapy sessions - LTG: "Learn how to deal with it when it comes and find what triggers my anxiety" (Anxiety) (MET)  ProgressTowards Goals: Progressing  Interventions: CBT, Motivational Interviewing, and Strength-based  Summary: Micheal Roth is a 48 y.o. male with past psych history of MDD and GAD, presenting for follow-up therapy session in efforts to improve management of depressive and anxious symptoms.   Patient actively engaged in session, presenting in overall pleasant moods and congruent affect, with brief periods of mild depressed moods throughout duration of visit.  Patient actively engaged in introductory check-in, providing recounts of recent events and factors contributing to presenting moods, and further reflection of recent participation in IOP program over the past 3 weeks.  Patient detailed of having taken Abilify  for approx 2 weeks and found helpful for moods but had neg. side effects related to sleep causing insomnia resulting in switch to Seroquel  2 weeks ago and finding benefit.  Patient further detailed events of past month since previous visit, noting of having gain skills to reinforce importance of implementing and endearing to firm boundaries to ensure feelings, emotions, and moods are not impacted by behaviors of others.  Briefly explored current perspectives and intended  approaches in navigating presenting concerns within the workplace and relationship with ex.  Patient responded well to interventions. Patient continues to meet criteria for MDD and GAD . Patient will continue to benefit from engagement in outpatient therapy due to being the least restrictive service to meet presenting needs.      01/02/2024    2:20 PM 12/11/2023   10:48 AM 11/29/2023    3:14 PM 11/26/2023    4:18 PM 11/05/2023    4:51 PM  Depression screen PHQ 2/9  Decreased Interest 1 3 2 1 1   Down, Depressed, Hopeless 1 2 1 1 1   PHQ - 2 Score 2 5 3 2 2   Altered sleeping 1 2 2 1 1   Tired, decreased energy 1 2 2 1 1   Change in appetite 1 0 1 0 0  Feeling bad or failure about yourself  1 2 1 1 1   Trouble concentrating 0 1 1 1 1   Moving slowly or fidgety/restless 0 0 0 0 0  Suicidal thoughts 1 2 0 1 0  PHQ-9 Score 7 14 10 7 6   Difficult doing work/chores Not difficult at all Very difficult Somewhat difficult Somewhat difficult Somewhat difficult   Flowsheet Row Counselor from 01/02/2024 in [facility] from 12[facility]11/2023 [facility]  C-SSRS RISK CATEGORY Low Risk Error: Question 6 not populated No Risk        Suicidal/Homicidal: Yes; No plan/intent. Passive SI, fleeting thoughts.  Therapist Response: Clinician utilized CBT, MI, and strengths based techniques to support pt in navigating reported sxs and stressors.  Openly engaged patient in introductory check-in, assessing presenting moods and affect, prompting  patient reflection of events and factors contributing to presenting moods.  Actively listened to patient's recounts of recent events, detailing experience and observed gains from reintegrating into IOP program to support and management of increased depressive symptoms and poor moods experienced over the past 2 to 3 months.  Utilized  open-ended questions to further elicit patient's reflection of thoughts and feelings surrounding explored topics and how patient believes this to be supportive moving forward.  Engaged in reassessing depressive symptoms via PHQ-9, reviewing current scores in comparison to scores of recent months and areas of observed symptoms.  Engage patient in 78-month review of individual treatment goals outlined in treatment plan specific to the management of depressive and anxious symptoms, noting of the areas of progress in areas for continued work.  Clinician reassessed severity of sxs, and presence of any safety concerns. Clinician provided support and empathy to patient during session.   Plan: Return again in 2 weeks.  Diagnosis:  Encounter Diagnosis  Name Primary?   MDD (major depressive disorder), recurrent episode, moderate (HCC) Yes     Collaboration of Care: Psychiatrist AEB provider notes available in EHR.  Patient/Guardian was advised Release of Information must be obtained prior to any record release in order to collaborate their care with an outside provider. Patient/Guardian was advised if they have not already done so to contact the registration department to sign all necessary forms in order for us  to release information regarding their care.   Consent: Patient/Guardian gives verbal consent for treatment and assignment of benefits for services provided during this visit. Patient/Guardian expressed understanding and agreed to proceed.   Patsi Boots, MSW, LCSW 01/02/2024,  2:20 PM

## 2024-01-03 ENCOUNTER — Ambulatory Visit (HOSPITAL_COMMUNITY)

## 2024-01-04 ENCOUNTER — Ambulatory Visit (HOSPITAL_COMMUNITY)

## 2024-01-07 ENCOUNTER — Ambulatory Visit (INDEPENDENT_AMBULATORY_CARE_PROVIDER_SITE_OTHER): Payer: 59 | Admitting: Licensed Clinical Social Worker

## 2024-01-07 ENCOUNTER — Other Ambulatory Visit (HOSPITAL_COMMUNITY)

## 2024-01-07 DIAGNOSIS — F411 Generalized anxiety disorder: Secondary | ICD-10-CM

## 2024-01-07 DIAGNOSIS — F331 Major depressive disorder, recurrent, moderate: Secondary | ICD-10-CM | POA: Diagnosis not present

## 2024-01-07 NOTE — Progress Notes (Signed)
 THERAPIST PROGRESS NOTE   Session Date: 01/07/2024  Session Time: 1610 - 1654 Virtual Visit via Video Note  I connected with Micheal Roth on 01/07/24 at  4:00 PM EDT by a video enabled telemedicine application and verified that I am speaking with the correct person using two identifiers.  Location: Patient: Home Provider: Home Office   I discussed the limitations of evaluation and management by telemedicine and the availability of in person appointments. The patient expressed understanding and agreed to proceed.  I discussed the assessment and treatment plan with the patient. The patient was provided an opportunity to ask questions and all were answered. The patient agreed with the plan and demonstrated an understanding of the instructions.   The patient was advised to call back or seek an in-person evaluation if the symptoms worsen or if the condition fails to improve as anticipated.  I provided 44 minutes of non-face-to-face time during this encounter.  Participation Level: Active  Behavioral Response: CasualAlertDepressed and Euthymic  Type of Therapy: Individual Therapy  Treatment Goals addressed:  - LTG: "Try to make sure I can deal with the depression as it comes" - LTG: Reduce frequency, intensity, and duration of depression symptoms so that daily functioning is improved - LTG: Increase coping skills to manage depression and improve ability to perform daily activities - STG: Sincer will reduce frequency of avoidant behaviors by 50% as evidenced by self-report in therapy sessions  ProgressTowards Goals: Not Progressing  Interventions: CBT, Motivational Interviewing, and Strength-based  Summary: Micheal Roth is a 48 y.o. male with past psych history of MDD and GAD, presenting for follow-up therapy session in efforts to improve management of depressive and anxious symptoms.   Patient actively engaged in session, presenting in depressed moods and congruent affect,  throughout duration of visit.  Patient actively engaged in introductory check-in,  sharing of things being "not so good", further detailing of having not returned to work last week due having panic attack when on the way to work the morning pt was due to return. Pt further detailed experienced sxs related to stress surrounding returning to work, sharing of having observed increased anxious sxs and worsening GI issues. Processed various factors contributing to pt's increased anxiousness surrounding work, proving to exacerbate sxs, processing various ways in which pt may prove to approach concerns and explore alternate options. 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  Patient responded well to interventions. Patient continues to meet criteria for MDD and GAD . Patient will continue to benefit from engagement in outpatient therapy due to being the least restrictive service to meet presenting needs.      01/02/2024    2:20 PM 12/11/2023   10:48 AM 11/29/2023    3:14 PM 11/26/2023    4:18 PM 11/05/2023    4:51 PM  Depression screen PHQ 2/9  Decreased Interest 1 3 2 1 1   Down, Depressed, Hopeless 1 2 1 1 1   PHQ - 2 Score 2 5 3 2 2   Altered sleeping 1 2 2 1 1   Tired, decreased energy 1 2 2 1 1   Change in appetite 1 0 1 0 0  Feeling bad or failure about yourself  1 2 1 1 1   Trouble concentrating 0 1 1 1 1   Moving slowly or fidgety/restless 0 0 0 0 0  Suicidal thoughts 1 2 0 1 0  PHQ-9 Score 7 14 10 7 6   Difficult doing work/chores Not difficult at all Very difficult Somewhat difficult Somewhat difficult Somewhat difficult   Flowsheet Row Counselor from 01/02/2024 in [facility] from 12/11/2023 in [facility] Office Visit  from [facility]  C-SSRS RISK CATEGORY Low Risk Error: Question 6 not populated No Risk        Suicidal/Homicidal: Yes; No plan/intent. Passive SI, fleeting thoughts.  Therapist Response: Clinician utilized CBT, MI, and strengths based techniques to support pt in navigating reported sxs and stressors.  Openly engaged patient in introductory check-in, assessing presenting moods and affect, eliciting pt's perspective of presenting challenges and factors contributing to presenting moods. Actively listened to pt's reflections of events of the past week, supporting pt in validating shared thoughts and feelings surrounding events and presenting stressors. Utilized open ended questions to support pt in further exploring thoughts and perspectives in relation to presenting sxs and stressors. Utilizing socratic questioning to further encourage pt's critical processing of sxs, stressors, and thoughts surrounding ways in which pt may prove to navigate challenges.  Clinician reassessed severity of sxs, and presence of any safety concerns. Clinician provided support and empathy to patient during session.   Plan: Return again in 1 weeks.  Diagnosis:  Encounter Diagnoses  Name Primary?   MDD (major depressive disorder), recurrent episode, moderate (HCC) Yes   GAD (generalized anxiety disorder)     Collaboration of Care: Psychiatrist AEB provider notes available in EHR.  Patient/Guardian was advised Release of Information must be obtained prior to any record release in order to collaborate their care with an outside provider. Patient/Guardian was advised if they have not already done so to contact the registration department to sign all necessary forms in order for us  to release information regarding their care.   Consent: Patient/Guardian gives verbal consent for treatment and assignment of benefits for services provided during this visit.  Patient/Guardian expressed understanding and agreed to proceed.   Patsi Boots, MSW, LCSW 01/07/2024,  4:12 PM

## 2024-01-14 ENCOUNTER — Encounter (HOSPITAL_COMMUNITY): Payer: Self-pay

## 2024-01-16 ENCOUNTER — Ambulatory Visit (HOSPITAL_COMMUNITY): Admitting: Licensed Clinical Social Worker

## 2024-01-16 DIAGNOSIS — F411 Generalized anxiety disorder: Secondary | ICD-10-CM | POA: Diagnosis not present

## 2024-01-16 DIAGNOSIS — F331 Major depressive disorder, recurrent, moderate: Secondary | ICD-10-CM

## 2024-01-16 NOTE — Progress Notes (Unsigned)
 THERAPIST PROGRESS NOTE   Session Date: 01/16/2024  Session Time: 1012 - 1052 Virtual Visit via Video Note  I connected with Micheal Roth on 01/16/24 at 10:12 AM EDT by a video enabled telemedicine application and verified that I am speaking with the correct person using two identifiers.  Location: Patient: Home Provider: [facility] Office   I discussed the limitations of evaluation and management by telemedicine and the availability of in person appointments. The patient expressed understanding and agreed to proceed.  I discussed the assessment and treatment plan with the patient. The patient was provided an opportunity to ask questions and all were answered. The patient agreed with the plan and demonstrated an understanding of the instructions.   The patient was advised to call back or seek an in-person evaluation if the symptoms worsen or if the condition fails to improve as anticipated.  I provided 40 minutes of non-face-to-face time during this encounter.  Participation Level: Active  Behavioral Response: CasualAlertDepressed and Euthymic  Type of Therapy: Individual Therapy  Treatment Goals addressed:  - LTG: "Try to make sure I can deal with the depression as it comes" - LTG: Reduce frequency, intensity, and duration of depression symptoms so that daily functioning is improved - LTG: Increase coping skills to manage depression and improve ability to perform daily activities - STG: Micheal Roth will reduce frequency of avoidant behaviors by 50% as evidenced by self-report in therapy sessions  ProgressTowards Goals: Progressing  Interventions: CBT, Motivational Interviewing, and Strength-based  Summary: Micheal Roth is a 48 y.o. male with past psych history of MDD and GAD, presenting for follow-up therapy session in efforts to improve management of depressive and anxious symptoms.   Patient actively engaged in session, presenting in depressed moods and congruent affect,  throughout duration of visit.  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 Finding self improving in spirits and challenges with ex who would hx tell pt they would not be together if pt didn't work for the city. Pt further shared of noticing continued GI upset related to worrying about returning to work, and detailing of having spent time thinking extensively about returning to work, finding comfort in decision not to return over the weekend. 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 Pt shared of having postponed search for new residence due to presenting stress surrounding employment, further expressing concerns related to potential change and/or loss in insurance, resulting in potential need to transition to alternate provider, with intent to apply for medicaid and explore alternate options for coverage before end of current month. Pt actively engaged in reassessing presenting depressive and anxious sxs observed over past two weeks via PHQ-9 and GAD-7, noting of increase in anxious sxs being solely related to stress surrounding work and decision to separate from employment, and maintained depressive sxs.  Patient responded well to interventions. Patient continues to meet criteria for MDD and  GAD . Patient will continue to benefit from  engagement in outpatient therapy due to being the least restrictive service to meet presenting needs.      01/16/2024   10:49 AM 01/02/2024    2:20 PM 12/11/2023   10:48 AM 11/29/2023    3:14 PM 11/26/2023    4:18 PM  Depression screen PHQ 2/9  Decreased Interest 1 1 3 2 1   Down, Depressed, Hopeless 1 1 2 1 1   PHQ - 2 Score 2 2 5 3 2   Altered sleeping 1 1 2 2 1   Tired, decreased energy 1 1 2 2 1   Change in appetite 0 1 0 1 0  Feeling bad or failure about yourself  1 1 2 1 1   Trouble concentrating 1 0 1 1 1   Moving slowly or fidgety/restless 0 0 0 0 0  Suicidal thoughts 1 1 2  0 1  PHQ-9 Score 7 7 14 10 7   Difficult doing work/chores Very difficult Not difficult at all Very difficult Somewhat difficult Somewhat difficult   Flowsheet Row Counselor from 01/16/2024 in [facility] from 01/02/2024 in [facility] from 12/11/2023 in [facility] PSY[facility]  C-SSRS RISK CATEGORY Low Risk Low Risk Error: Question 6 not populated         01/16/2024   10:47 AM 11/05/2023    4:50 PM 10/22/2023    4:12 PM 10/01/2023    4:06 PM  GAD 7 : Generalized Anxiety Score  Nervous, Anxious, on Edge 1 0 0 0  Control/stop worrying 1 0 0 0  Worry too much - different things 1 0 0 0  Trouble relaxing 1 1 0 0  Restless 0 0 0 0  Easily annoyed or irritable 1 1 1 1   Afraid - awful might happen 1 0 0 0  Total GAD 7 Score 6 2 1 1   Anxiety Difficulty Somewhat difficult Not difficult at all Not difficult at all Not difficult at all   Suicidal/Homicidal: Yes; No plan/intent. Passive SI, fleeting thoughts.  Therapist Response: Clinician utilized CBT, MI, and strengths based techniques to support pt in navigating reported sxs and stressors.  Openly ***   engaged patient in introductory check-in, assessing presenting moods and affect, eliciting pt's perspective of presenting challenges and factors  contributing to presenting moods. Actively listened to pt's reflections of events of the past week, supporting pt in validating shared thoughts and feelings surrounding events and presenting stressors. Utilized open ended questions to support pt in further exploring thoughts and perspectives in relation to presenting sxs and stressors. Utilizing socratic questioning to further encourage pt's critical processing of sxs, stressors, and thoughts surrounding ways in which pt may prove to navigate challenges.  Clinician reassessed severity of sxs, and presence of any safety concerns. Clinician provided support and empathy to patient during session.   Plan: Return again in 1 weeks.  Diagnosis:  Encounter Diagnoses  Name Primary?   MDD (major depressive disorder), recurrent episode, moderate (HCC) Yes   GAD (generalized anxiety disorder)      Collaboration of Care: Psychiatrist AEB provider notes available in EHR.  Patient/Guardian was advised Release of Information must be obtained prior to any record release in order to collaborate their care with an outside provider. Patient/Guardian was advised if they have not already done so to contact the registration department to sign all necessary forms in order for us  to release information regarding their care.  Consent: Patient/Guardian gives verbal consent for treatment and assignment of benefits for services provided during this visit. Patient/Guardian expressed understanding and agreed to proceed.   Patsi Boots, MSW, LCSW 01/16/2024,  10:51 AM

## 2024-01-21 ENCOUNTER — Telehealth (HOSPITAL_COMMUNITY): Admitting: Psychiatry

## 2024-01-21 ENCOUNTER — Encounter (HOSPITAL_COMMUNITY): Payer: Self-pay | Admitting: Psychiatry

## 2024-01-21 DIAGNOSIS — F4312 Post-traumatic stress disorder, chronic: Secondary | ICD-10-CM

## 2024-01-21 DIAGNOSIS — F331 Major depressive disorder, recurrent, moderate: Secondary | ICD-10-CM

## 2024-01-21 NOTE — Progress Notes (Signed)
 [facility] MD Virtual Progress Note   Patient Location: Home Provider Location: Home Office  I connect with patient by video and verified that I am speaking with correct person by using two identifiers. I discussed the limitations of evaluation and management by telemedicine and the availability of in person appointments. I also discussed with the patient that there may be a patient responsible charge related to this service. The patient expressed understanding and agreed to proceed.  Micheal Roth [id number] 48 y.o.  01/21/2024 9:39 AM  History of Present Illness:  Patient is evaluated for video session.  He recently finished IOP.  He is no longer taking Abilify  after noticed insomnia.  He is not on Seroquel  50 mg at bedtime.  He is sleeping better and overall he feels much better in his mood.  He quit his job.  Patient was working [employer] and job was very stressful.  Patient told after he finished IOP and decided to go back to work he had panic attack and then he decided to quit the job.  He recently had interview to work at [employer] and is going to start tomorrow.  He is not sure how many hours but possible 20 hours.  He feels comfortable because it is only 10 years away and not stressful.  Patient lives with his parents.  Recently is 46 year old never had pneumonia but she is doing better.  Patient was able to see his both kids and he was happy after the visit.  He has a 46 year old and 46 year old.  Patient occasionally had nightmares and flashback and he is under therapy with Mr. Royston Cornea [facility] for his chronic PTSD.  His appetite is okay.  His weight is stable.  He has not taken hydroxyzine  in a while but sleep has been good with the help of Seroquel .  He has no tremors, shakes or any EPS.  He has not seen PCP but willing to have appointment very soon for physical and blood work.  His PCP is Dr. Concha Deed at [facility].  Past Psychiatric History: H/O depression and anxiety. Had passive and fleeting suicidal thoughts and seen at [facility]. Did PHP and IOP. No history of suicidal attempt. History of difficult childhood and trauma. Abilify  caused Insomnia.   Outpatient Encounter Medications as of 01/21/2024  Medication Sig   acetaminophen  (TYLENOL ) 500 MG tablet Take 500 mg by mouth every 6 (six) hours as needed.   albuterol  (VENTOLIN  HFA) 108 (90 Base) MCG/ACT inhaler Inhale 2 puffs into the lungs every 4 (four) hours as needed for wheezing or shortness of breath. Last used: 0715-720 am today   escitalopram  (LEXAPRO ) 20 MG tablet Take 1 tablet (20 mg total) by mouth daily.   hydrOXYzine  (ATARAX ) 50 MG tablet Take 1-2 tablets (50-100 mg total) by mouth at bedtime as needed and may repeat dose one time if needed for anxiety.   ibuprofen  (ADVIL ) 200 MG tablet Take 200 mg by mouth every 6 (six) hours as needed.   omeprazole  (PRILOSEC OTC) 20 MG tablet Take 20 mg by mouth daily before breakfast.   ondansetron  (ZOFRAN -ODT) 4 MG disintegrating tablet Take 1 tablet (4 mg total) by mouth every 8 (eight) hours as needed for nausea or vomiting.   QUEtiapine  (SEROQUEL ) 50 MG tablet Take 1 tablet (50 mg total) by mouth at bedtime.   No facility-administered encounter medications on file as of 01/21/2024.    Recent Results (from the past 2160 hours)  Surgical pathology ([facility]  Endoscopy)     Status: None   Collection Time: 11/12/23 12:00 AM  Result Value Ref Range   SURGICAL PATHOLOGY      SURGICAL PATHOLOGY [facility] [address] [place], [place] [address] Telephone [phone] or [phone] Fax [phone]  REPORT OF SURGICAL PATHOLOGY   Accession #: [id number] Patient Name: Micheal Roth Visit # : [id number]  MRN: [id number] Physician: Harry Lindau DOB/Age [birth year] (Age: 74) Gender: M Collected Date: 11/12/2023 Received Date: 11/13/2023  FINAL  DIAGNOSIS       1. Surgical [P], colon, cecum, polyp (1) :       - SESSILE SERRATED POLYP.      - NO DYSPLASIA OR MALIGNANCY.       2. Surgical [P], colon, recto-sigmoid and rectum, polyp (9) :       - HYPERPLASTIC POLYP(S).      - NO DYSPLASIA OR MALIGNANCY.       ELECTRONIC SIGNATURE : Almeda Jacobs M.D., Nupur, Pathologist, Electronic Signature  MICROSCOPIC DESCRIPTION  CASE COMMENTS STAINS USED IN DIAGNOSIS: H&E H&E H&E-2    CLINICAL HISTORY  SPECIMEN(S) OBTAINED 1. Surgical [P], Colon, Cecum, Polyp (1) 2. Surgical [P], Colon, Recto- sigmoid And Rectum, Polyp (9)  SPECIMEN COMMENTS: 1. Special screening for malignant neoplasms, colon; benign neoplasm of cecum; benign neoplasm of rectosigmoid junction; benign neoplasm of rectum SPECIMEN CLINICAL INFORMATION: 1. R/O adenoma 2. R/O adenoma    Gross Description 1. Received in formalin are tan, soft tissue fragments that are submitted in toto.Number: 1 Size: 0.9 cm = 2 sec, (1B) ( TA ) 2. Received in formalin are tan, soft tissue fragments that are submitted in toto.Number: 7, Size: 0.2 cm smallest to 0.6 cm largest = 8 sec (1B) ( TA )        Report signed out from the following location(s) [place] [address], [place] [address] CLIA #: [id number]  [facility] [address] [place], [place] [address] CLIA #: [id number]      Psychiatric Specialty Exam: Physical Exam  Review of Systems  Weight 256 lb (116.1 kg).There is no height or weight on file to calculate BMI.  General Appearance: Casual and long beard   Eye Contact:  Good  Speech:  Normal Rate  Volume:  Normal  Mood:  Anxious  Affect:  Appropriate  Thought Process:  Goal Directed  Orientation:  Full (Time, Place, and Person)  Thought Content:  Logical  Suicidal Thoughts:  No  Homicidal Thoughts:  No  Memory:  Immediate;   Good Recent;   Good Remote;   Good  Judgement:  Intact  Insight:   Present  Psychomotor Activity:  Normal  Concentration:  Concentration: Fair and Attention Span: Fair  Recall:  Good  Fund of Knowledge:  Good  Language:  Good  Akathisia:  No  Handed:  Right  AIMS (if indicated):     Assets:  Communication Skills Desire for Improvement Housing Resilience Social Support Transportation  ADL's:  Intact  Cognition:  WNL  Sleep:  better       01/16/2024   10:49 AM 01/02/2024    2:20 PM 12/11/2023   10:48 AM 11/29/2023    3:14 PM 11/26/2023    4:18 PM  Depression screen PHQ 2/9  Decreased Interest 1 1 3 2 1   Down, Depressed, Hopeless 1 1 2 1 1   PHQ - 2 Score 2 2 5 3 2   Altered sleeping 1  1 2 2 1   Tired, decreased energy 1 1 2 2 1   Change in appetite 0 1 0 1 0  Feeling bad or failure about yourself  1 1 2 1 1   Trouble concentrating 1 0 1 1 1   Moving slowly or fidgety/restless 0 0 0 0 0  Suicidal thoughts 1 1 2  0 1  PHQ-9 Score 7 7 14 10 7   Difficult doing work/chores Very difficult Not difficult at all Very difficult Somewhat difficult Somewhat difficult    Assessment/Plan: MDD (major depressive disorder), recurrent episode, moderate (HCC)  Chronic post-traumatic stress disorder (PTSD)  I reviewed notes from IOP and screening.  Since quit his job he is feeling better.  He is no longer taking Abilify  after he felt it was causing insomnia.  He is now on Seroquel  50 mg at bedtime that is helping his sleep.  He is also in therapy with  Mr. Royston Cornea for chronic PTSD.  Reviewed current medication.  He is on Lexapro  20 mg and denies any tremors shakes or any EPS.  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  He has not taken hydroxyzine  in a while.  He can appoint with his PCP for blood work and physical.  He has cut down his salt intake due to high blood pressure.  Discussed medication side effects specially metabolic syndrome with psychotropic medication.  Encourage walking, exercise.  Recommend to call us  back if there is any  question or any concern.  Follow-up in 6 weeks.   Follow Up Instructions:     I discussed the assessment and treatment plan with the patient. The patient was provided an opportunity to ask questions and all were answered. The patient agreed with the plan and demonstrated an understanding of the instructions.   The patient was advised to call back or seek an in-person evaluation if the symptoms worsen or if the condition fails to improve as anticipated.    Collaboration of Care: Other provider involved in patient's care AEB notes are available in epic to review  Patient/Guardian was advised Release of Information must be obtained prior to any record release in order to collaborate their care with an outside provider. Patient/Guardian was advised if they have not already done so to contact the registration department to sign all necessary forms in order for us  to release information regarding their care.   Consent: Patient/Guardian gives verbal consent for treatment and assignment of benefits for services provided during this visit. Patient/Guardian expressed understanding and agreed to proceed.     Total encounter time 25 minutes which includes face-to-face time, chart reviewed, care coordination, order entry and documentation during this encounter.   Note: This document was prepared by [employer] voice dictation technology and any errors that results from this process are unintentional.    Arturo Late, MD 01/21/2024

## 2024-01-23 ENCOUNTER — Ambulatory Visit (HOSPITAL_COMMUNITY): Admitting: Licensed Clinical Social Worker

## 2024-02-01 ENCOUNTER — Ambulatory Visit (HOSPITAL_COMMUNITY): Payer: Self-pay | Admitting: Licensed Clinical Social Worker

## 2024-02-01 ENCOUNTER — Encounter (HOSPITAL_COMMUNITY): Payer: Self-pay

## 2024-02-06 ENCOUNTER — Ambulatory Visit (HOSPITAL_COMMUNITY): Admitting: Licensed Clinical Social Worker

## 2024-02-13 ENCOUNTER — Ambulatory Visit (HOSPITAL_COMMUNITY): Admitting: Licensed Clinical Social Worker

## 2024-02-20 ENCOUNTER — Ambulatory Visit (HOSPITAL_COMMUNITY): Admitting: Licensed Clinical Social Worker

## 2024-02-27 ENCOUNTER — Ambulatory Visit (HOSPITAL_COMMUNITY): Admitting: Licensed Clinical Social Worker

## 2024-03-03 ENCOUNTER — Telehealth (HOSPITAL_COMMUNITY): Admitting: Psychiatry

## 2024-07-16 ENCOUNTER — Ambulatory Visit
Admission: EM | Admit: 2024-07-16 | Discharge: 2024-07-16 | Disposition: A | Discharge status: Home / Self Care | Payer: Self-pay | Attending: Family Medicine | Admitting: Family Medicine

## 2024-07-16 ENCOUNTER — Ambulatory Visit (INDEPENDENT_AMBULATORY_CARE_PROVIDER_SITE_OTHER): Payer: Self-pay

## 2024-07-16 DIAGNOSIS — R0602 Shortness of breath: Secondary | ICD-10-CM

## 2024-07-16 DIAGNOSIS — R062 Wheezing: Secondary | ICD-10-CM

## 2024-07-16 DIAGNOSIS — J069 Acute upper respiratory infection, unspecified: Secondary | ICD-10-CM

## 2024-07-16 DIAGNOSIS — R112 Nausea with vomiting, unspecified: Secondary | ICD-10-CM

## 2024-07-16 LAB — POCT URINE DIPSTICK
Bilirubin, UA: NEGATIVE
Blood, UA: NEGATIVE
Glucose, UA: NEGATIVE mg/dL
Ketones, POC UA: NEGATIVE mg/dL
Leukocytes, UA: NEGATIVE
Nitrite, UA: NEGATIVE
POC PROTEIN,UA: 30 — AB
Spec Grav, UA: 1.025 (ref 1.010–1.025)
Urobilinogen, UA: 0.2 U/dL
pH, UA: 5.5 (ref 5.0–8.0)

## 2024-07-16 LAB — POC COVID19/FLU A&B COMBO
Covid Antigen, POC: NEGATIVE
Influenza A Antigen, POC: NEGATIVE
Influenza B Antigen, POC: NEGATIVE

## 2024-07-16 MED ORDER — HYDROCODONE BIT-HOMATROP MBR 5-1.5 MG/5ML PO SOLN: 5.0000 mL | Freq: Four times a day (QID) | ORAL | 0 refills | Status: AC | PRN

## 2024-07-16 MED ORDER — DEXAMETHASONE SOD PHOSPHATE PF 10 MG/ML IJ SOLN
10.0000 mg | Freq: Once | INTRAMUSCULAR | Status: AC
  Administered 2024-07-16: 10 mg via INTRAMUSCULAR

## 2024-07-16 NOTE — Discharge Instructions (Addendum)
 Be aware, your cough medication may cause drowsiness. Please do not drive, operate heavy machinery or make important decisions while on this medication, it can cloud your judgement.  Meds ordered this encounter  Medications   HYDROcodone  bit-homatropine (HYCODAN) 5-1.5 MG/5ML syrup    Sig: Take 5 mLs by mouth every 6 (six) hours as needed for cough.    Dispense:  90 mL    Refill:  0   dexamethasone  (DECADRON ) injection 10 mg

## 2024-07-16 NOTE — ED Triage Notes (Signed)
 Patient reports waking up yesterday morning with Fever up to 102 with vomiting (on/off), last night Fever returned up to 103, no more vomiting but still nausea. I have rattling in my chest now (but reports having that for weeks) with some sob. Occasionally pain in the center of my back. He reports being a big smoker. The pain is in the center of his back (sharp) but around taking deep breaths and coughing only.

## 2024-07-16 NOTE — ED Provider Notes (Signed)
 [facility]ENTER   [id number] 07/16/24 Arrival Time: 9187  ASSESSMENT & PLAN:  1. Viral URI with cough   2. Nausea and vomiting, unspecified vomiting type   3. SOB (shortness of breath)   4. Wheezing    I have personally viewed and independently interpreted the imaging studies ordered this visit. CXR: no acute changes; no PNA.  No longer vomiting. Without resp distress.  Discussed typical duration of likely viral illness. Results for orders placed or performed during the hospital encounter of 07/16/24  POCT URINE DIPSTICK   Collection Time: 07/16/24  8:35 AM  Result Value Ref Range   Color, UA yellow yellow   Clarity, UA cloudy (A) clear   Glucose, UA negative negative mg/dL   Bilirubin, UA negative negative   Ketones, POC UA negative negative mg/dL   Spec Grav, UA 8.974 8.989 - 1.025   Blood, UA negative negative   pH, UA 5.5 5.0 - 8.0   POC PROTEIN,UA =30 (A) negative, trace   Urobilinogen, UA 0.2 0.2 or 1.0 E.U./dL   Nitrite, UA Negative Negative   Leukocytes, UA Negative Negative  POC Covid19/Flu A&B Antigen   Collection Time: 07/16/24  8:41 AM  Result Value Ref Range   Influenza A Antigen, POC Negative Negative   Influenza B Antigen, POC Negative Negative   Covid Antigen, POC Negative Negative   OTC symptom care as needed.  Meds ordered this encounter  Medications   HYDROcodone  bit-homatropine (HYCODAN) 5-1.5 MG/5ML syrup    Sig: Take 5 mLs by mouth every 6 (six) hours as needed for cough.    Dispense:  90 mL    Refill:  0   dexamethasone  (DECADRON ) injection 10 mg   Work note provided.   Follow-up Information     Slatosky, Norleen PARAS., MD.   Specialty: Family Medicine Why: As needed. Contact information: [address] [place] [address] [phone]                 Reviewed expectations re: course of current medical issues. Questions answered. Outlined signs and symptoms indicating need for more acute intervention. Understanding  verbalized. After Visit Summary given.   SUBJECTIVE: History from: Patient. Micheal Roth is a 48 y.o. male. Patient reports waking up yesterday morning with Fever up to 102 with vomiting (on/off), last night Fever returned up to 103, no more vomiting but still nausea. I have rattling in my chest now (but reports having that for weeks) with some sob. Occasionally pain in the center of my back. He reports being a big smoker. The pain is in the center of his back (sharp) but around taking deep breaths and coughing only. Tolerating PO intake. Needs work note.  OBJECTIVE:  Vitals:   07/16/24 0823 07/16/24 0825 07/16/24 0827  BP:   (!) 160/97  Pulse:  91 84  Resp:  (!) 24 (!) 22  Temp:  (!) 100.6 F (38.1 C)   TempSrc:  Oral   SpO2:  95% 95%  Weight: 115.7 kg    Height: 6' (1.829 m)      General appearance: alert; no distress Eyes: PERRLA; EOMI; conjunctiva normal HENT: [place]; AT; with nasal congestion Neck: supple  Lungs: speaks full sentences without difficulty; unlabored; is coughing; bilat exp wheezing Extremities: no edema Skin: warm and dry Neurologic: normal gait Psychological: alert and cooperative; normal mood and affect  Labs: Results for orders placed or performed during the hospital encounter of 07/16/24  POCT URINE DIPSTICK   Collection  Time: 07/16/24  8:35 AM  Result Value Ref Range   Color, UA yellow yellow   Clarity, UA cloudy (A) clear   Glucose, UA negative negative mg/dL   Bilirubin, UA negative negative   Ketones, POC UA negative negative mg/dL   Spec Grav, UA 8.974 8.989 - 1.025   Blood, UA negative negative   pH, UA 5.5 5.0 - 8.0   POC PROTEIN,UA =30 (A) negative, trace   Urobilinogen, UA 0.2 0.2 or 1.0 E.U./dL   Nitrite, UA Negative Negative   Leukocytes, UA Negative Negative  POC Covid19/Flu A&B Antigen   Collection Time: 07/16/24  8:41 AM  Result Value Ref Range   Influenza A Antigen, POC Negative Negative   Influenza B Antigen, POC  Negative Negative   Covid Antigen, POC Negative Negative   Labs Reviewed  POCT URINE DIPSTICK - Abnormal; Notable for the following components:      Result Value   Clarity, UA cloudy (*)    POC PROTEIN,UA =30 (*)    All other components within normal limits  POC COVID19/FLU A&B COMBO - Normal    Imaging: DG Chest 2 View Result Date: 07/16/2024 CLINICAL DATA:  Cough and shortness of breath EXAM: DG CHEST 2V COMPARISON:  09/16/2023 FINDINGS: Cardiomediastinal silhouette and pulmonary vasculature are within normal limits. Lungs are clear. IMPRESSION: No acute cardiopulmonary process. Electronically Signed   By: Aliene Lloyd M.D.   On: 07/16/2024 08:57    No Known Allergies  Past Medical History:  Diagnosis Date   Anxiety    Arthritis    Asthma    sports   Chronic back pain    Chronic left shoulder pain    Chronic neck pain    Depression    GERD (gastroesophageal reflux disease)    h/o TOBACCO USER 05/10/2009   Qualifier: Diagnosis of   By: Manford Longs      Quit smoking cig 2 yrs ago - smoked for 20 yrs at 1.5ppd at most.   30 yr hx.   Still Smokes marijuana daily --> for 30-40 yrs.      History of shingles    Migraines    Nausea and vomiting 04/09/2017   Paresthesia of left arm and leg    and weakness of left arm and leg   Seizures (HCC)    not in years   Social History   Socioeconomic History   Marital status: Single    Spouse name: Not on file   Number of children: 2   Years of education: Not on file   Highest education level: GED or equivalent  Occupational History   Not on file  Tobacco Use   Smoking status: Every Day    Current packs/day: 0.00    Average packs/day: 1 pack/day for 28.3 years (28.3 ttl pk-yrs)    Types: Cigarettes    Start date: 75    Last attempt to quit: 01/02/2022    Years since quitting: 2.5   Smokeless tobacco: Former  Building Services Engineer status: Former   Substances: Nicotine  Substance and Sexual Activity   Alcohol use: Yes     Comment: Occassionally.   Drug use: Yes    Frequency: 2.0 times per week    Types: Marijuana    Comment: Occassionally.   Sexual activity: Not Currently  Other Topics Concern   Not on file  Social History Narrative   Not on file   Social Drivers of Health   Financial Resource Strain: Not  on file  Food Insecurity: Not on file  Transportation Needs: Not on file  Physical Activity: Not on file  Stress: Not on file  Social Connections: Not on file  Intimate Partner Violence: Not on file   Family History  Problem Relation Age of Onset   Diabetes Father    Diabetes Maternal Grandmother    Heart disease Maternal Grandmother        CHF   Heart attack Maternal Grandmother    Crohn's disease Brother    Colon cancer Neg Hx    Colon polyps Neg Hx    Esophageal cancer Neg Hx    Stomach cancer Neg Hx    Rectal cancer Neg Hx    Past Surgical History:  Procedure Laterality Date   BACK SURGERY     COLONOSCOPY  11/12/2023   Sandor Flatter at [facility]   KNEE ARTHROSCOPY WITH LATERAL MENISECTOMY Right 05/18/2022   Procedure: KNEE ARTHROSCOPY WITH CHONDROPLASTY  AND DEBRIDEMENT;  Surgeon: Sharl Selinda Dover, MD;  Location: [facility] ORS;  Service: Orthopedics;  Laterality: Right;  62   KNEE SURGERY     TUMOR REMOVAL Bilateral 1980   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

## 2024-07-21 ENCOUNTER — Emergency Department (HOSPITAL_COMMUNITY)
Admission: EM | Admit: 2024-07-21 | Discharge: 2024-07-21 | Disposition: A | Discharge status: Home / Self Care | Payer: Self-pay | Attending: Emergency Medicine | Admitting: Emergency Medicine

## 2024-07-21 ENCOUNTER — Other Ambulatory Visit: Payer: Self-pay

## 2024-07-21 ENCOUNTER — Encounter (HOSPITAL_COMMUNITY): Payer: Self-pay | Admitting: *Deleted

## 2024-07-21 ENCOUNTER — Emergency Department (HOSPITAL_COMMUNITY): Payer: Self-pay

## 2024-07-21 DIAGNOSIS — M542 Cervicalgia: Secondary | ICD-10-CM | POA: Insufficient documentation

## 2024-07-21 DIAGNOSIS — J45909 Unspecified asthma, uncomplicated: Secondary | ICD-10-CM | POA: Insufficient documentation

## 2024-07-21 DIAGNOSIS — J069 Acute upper respiratory infection, unspecified: Secondary | ICD-10-CM | POA: Insufficient documentation

## 2024-07-21 LAB — RESP PANEL BY RT-PCR (RSV, FLU A&B, COVID)  RVPGX2
Influenza A by PCR: NEGATIVE
Influenza B by PCR: NEGATIVE
Resp Syncytial Virus by PCR: NEGATIVE
SARS Coronavirus 2 by RT PCR: NEGATIVE

## 2024-07-21 LAB — CBC
HCT: 47.6 % (ref 39.0–52.0)
Hemoglobin: 16.9 g/dL (ref 13.0–17.0)
MCH: 31.5 pg (ref 26.0–34.0)
MCHC: 35.5 g/dL (ref 30.0–36.0)
MCV: 88.6 fL (ref 80.0–100.0)
Platelets: 298 K/uL (ref 150–400)
RBC: 5.37 MIL/uL (ref 4.22–5.81)
RDW: 12.5 % (ref 11.5–15.5)
WBC: 13.8 K/uL — ABNORMAL HIGH (ref 4.0–10.5)
nRBC: 0 % (ref 0.0–0.2)

## 2024-07-21 LAB — BASIC METABOLIC PANEL WITH GFR
Anion gap: 13 (ref 5–15)
BUN: 15 mg/dL (ref 6–20)
CO2: 21 mmol/L — ABNORMAL LOW (ref 22–32)
Calcium: 9 mg/dL (ref 8.9–10.3)
Chloride: 101 mmol/L (ref 98–111)
Creatinine, Ser: 0.87 mg/dL (ref 0.61–1.24)
GFR, Estimated: >60 mL/min (ref >60)
Glucose, Bld: 177 mg/dL — ABNORMAL HIGH (ref 70–99)
Potassium: 3.6 mmol/L (ref 3.5–5.1)
Sodium: 135 mmol/L (ref 135–145)

## 2024-07-21 MED ORDER — PREDNISONE 50 MG PO TABS: 50.0000 mg | ORAL_TABLET | Freq: Every day | ORAL | 0 refills | Status: AC

## 2024-07-21 MED ORDER — PREDNISONE 5 MG PO TABS
50.0000 mg | ORAL_TABLET | Freq: Every day | ORAL | Status: DC
  Administered 2024-07-21: 50 mg via ORAL
  Filled 2024-07-21: qty 2

## 2024-07-21 MED ORDER — AZITHROMYCIN 250 MG PO TABS: 250.0000 mg | ORAL_TABLET | Freq: Every day | ORAL | 0 refills | Status: AC

## 2024-07-21 MED ORDER — AZITHROMYCIN 250 MG PO TABS
500.0000 mg | ORAL_TABLET | Freq: Every day | ORAL | Status: DC
  Administered 2024-07-21: 500 mg via ORAL
  Filled 2024-07-21: qty 2

## 2024-07-21 NOTE — Discharge Instructions (Addendum)
 Please take 1 tablet azithromycin  250 mg for 4 days, starting tomorrow.  Please take 1 tablet prednisone  50 mg for 5 days, starting tomorrow.  Please continue to use your inhaler at home and come back to the ED if your shortness of breath worsens, you have a fever, or have increased jaw pain.

## 2024-07-21 NOTE — ED Triage Notes (Signed)
 Woke up Tuesday with cough and fever, seen at [facility] for the same, told he had a viral infection. Reporting symptoms are not improved. Pain in the middle of back (in between shoulder blades) and right jaw. SOB this morning. Last tylenol  and albuterol  around 0200

## 2024-07-21 NOTE — ED Provider Notes (Addendum)
 [facility] DEPARTMENT AT [facility] Provider Note   CSN: [id number] Arrival date & time: 07/21/24  0446     Patient presents with: Cough   Micheal Roth is a 48 y.o. male.   Patient seen in urgent care on the 12th for shortness of breath, COVID and flu negative.  Symptoms have not gotten any better and he is now having persistent cough, worsening shortness of breath, new pain midline thoracic back worsened by deep breaths as well as right upper jaw pain.  Denies chest pain or changes in bowels or changes with urination.  Does not have a known history of COPD although does have an inhaler at home for smoker's cough.  The history is provided by the patient.  Cough Cough characteristics:  Non-productive Sputum characteristics:  Clear Onset quality:  Gradual Duration:  1 week Timing:  Constant Progression:  Worsening Smoker: yes   Relieved by:  Nothing Worsened by:  Deep breathing Ineffective treatments:  Beta-agonist inhaler Associated symptoms: ear fullness, ear pain, rhinorrhea, shortness of breath and sinus congestion   Associated symptoms: no eye discharge   Ear pain:    Location:  Right   Severity:  Moderate   Onset quality:  Gradual   Chronicity:  New      Prior to Admission medications   Medication Sig Start Date End Date Taking? Authorizing Provider  acetaminophen  (TYLENOL ) 500 MG tablet Take 500 mg by mouth every 6 (six) hours as needed. 10/10/13   [provider]  albuterol  (VENTOLIN  HFA) 108 (90 Base) MCG/ACT inhaler Inhale 2 puffs into the lungs every 4 (four) hours as needed for wheezing or shortness of breath. Last used: 0715-720 am today 07/22/23   [provider]  escitalopram  (LEXAPRO ) 20 MG tablet Take 1 tablet (20 mg total) by mouth daily. 12/24/23 03/23/24  Marry Clamp, MD  HYDROcodone  bit-homatropine [facility]) 5-1.5 MG/5ML syrup Take 5 mLs by mouth every 6 (six) hours as needed for cough. 07/16/24   Rolinda Rogue, MD  hydrOXYzine  (ATARAX ) 50 MG tablet Take 1-2 tablets (50-100 mg total) by mouth at bedtime as needed and may repeat dose one time if needed for anxiety. 07/10/23   Arfeen, Leni DASEN, MD  ibuprofen  (ADVIL ) 200 MG tablet Take 200 mg by mouth every 6 (six) hours as needed. 10/10/13   [provider]  omeprazole  (PRILOSEC OTC) 20 MG tablet Take 20 mg by mouth daily before breakfast.    [provider]  ondansetron  (ZOFRAN -ODT) 4 MG disintegrating tablet Take 1 tablet (4 mg total) by mouth every 8 (eight) hours as needed for nausea or vomiting. 05/01/23   Van Knee, MD  QUEtiapine  (SEROQUEL ) 50 MG tablet Take 1 tablet (50 mg total) by mouth at bedtime. 12/17/23   Marry Clamp, MD    Allergies: Patient has no known allergies.    Review of Systems  HENT:  Positive for ear pain and rhinorrhea.   Eyes:  Negative for discharge.  Respiratory:  Positive for cough and shortness of breath.     Updated Vital Signs BP (!) 139/94 (BP Location: Right Arm)   Pulse 79   Temp 98.4 F (36.9 C) (Oral)   Resp 18   SpO2 (!) 84%   Physical Exam Constitutional:      Appearance: Normal appearance.  HENT:     Ears:     Comments: Tenderness to movement of left helix Bilateral purulence behind tympanic membranes    Nose: Congestion and rhinorrhea present.  Mouth/Throat:     Mouth: Mucous membranes are moist.     Pharynx: No oropharyngeal exudate or posterior oropharyngeal erythema.     Comments: Do not appreciate any swelling or mass defect in right jaw Eyes:     Conjunctiva/sclera: Conjunctivae normal.     Pupils: Pupils are equal, round, and reactive to light.  Neck:     Comments: Tender to palpation over left sternocleidomastoid No LAD Cardiovascular:     Rate and Rhythm: Normal rate and regular rhythm.     Heart sounds: Normal heart sounds.  Pulmonary:     Effort: Pulmonary effort is normal.     Breath sounds: Normal breath sounds. No wheezing or rales.   Abdominal:     General: Bowel sounds are normal.  Musculoskeletal:     Cervical back: Tenderness present.  Lymphadenopathy:     Cervical: No cervical adenopathy.  Skin:    General: Skin is warm.     Comments: No rash or lesion on back  Neurological:     General: No focal deficit present.     Mental Status: He is alert and oriented to person, place, and time.  Psychiatric:        Mood and Affect: Mood normal.        Behavior: Behavior normal.     (all labs ordered are listed, but only abnormal results are displayed) Labs Reviewed  BASIC METABOLIC PANEL WITH GFR - Abnormal; Notable for the following components:      Result Value   CO2 21 (*)    Glucose, Bld 177 (*)    All other components within normal limits  CBC - Abnormal; Notable for the following components:   WBC 13.8 (*)    All other components within normal limits  RESP PANEL BY RT-PCR (RSV, FLU A&B, COVID)  [id number]    EKG: EKG Interpretation Date/Time:  Monday July 21 2024 05:10:50 EST Ventricular Rate:  78 PR Interval:  148 QRS Duration:  84 QT Interval:  388 QTC Calculation: 442 R Axis:   70  Text Interpretation: Normal sinus rhythm Normal ECG Confirmed by Garrick Charleston [phone]) on 07/21/2024 9:25:26 AM  Radiology: DG Chest 2 View Result Date: 07/21/2024 EXAM: 2 VIEW(S) XRAY OF THE CHEST 07/21/2024 05:26:50 AM COMPARISON: 07/16/2024 CLINICAL HISTORY: cough, sob, fevers FINDINGS: LUNGS AND PLEURA: No focal pulmonary opacity. No pleural effusion. No pneumothorax. HEART AND MEDIASTINUM: No acute abnormality of the cardiac and mediastinal silhouettes. BONES AND SOFT TISSUES: No acute osseous abnormality. IMPRESSION: 1. No acute cardiopulmonary process. Electronically signed by: Waddell Calk MD 07/21/2024 05:31 AM EST RP Workstation: [id number]     Procedures   Medications Ordered in the ED  predniSONE  (DELTASONE ) tablet 50 mg (has no administration in time range)  azithromycin  (ZITHROMAX ) tablet 500  mg (has no administration in time range)                                    Medical Decision Making Amount and/or Complexity of Data Reviewed Labs: ordered. Radiology: ordered.  Risk Prescription drug management.   This patient presents to the ED for concern of shortness of breath, jaw pain and back pain worsened with deep breaths, this involves an extensive number of treatment options, and is a complaint that carries with it a high risk of complications and morbidity.  The differential diagnosis includes viral URI superimposed by bacterial sinusitis, COPD exacerbation, pneumonia, mouth abscess,  otitis media, MSK related back pain   Co morbidities / Chronic conditions that complicate the patient evaluation  Chronic smoker, asthma, GERD   Additional history obtained:  Additional history obtained from EMR External records from outside source obtained and reviewed including urgent care note from 11-12   Lab Tests:  I Ordered, and personally interpreted labs.  The pertinent results include: White blood cell count of 13.8   Imaging Studies ordered:  I ordered imaging studies including chest x-ray with no consolidation or cardiopulmonary acute process I independently visualized and interpreted imaging which showed normal lung contours and heart size I agree with the radiologist interpretation   Problem List / ED Course / Critical interventions / Medication management  Acute bacterial sinusitis with otitis media To the pharmacy I sent: azithromycin  250 mg for 4 days, he was given 500 mg in the ED on 11/17. Will take 250 mg 11/18 - 11/21. Also, prednisone  50 mg for 5 days.  Patient understands and given a work note to return on the 19th I have reviewed the patients home medicines and have made adjustments as needed   Test / Admission - Considered:  Patient was breathing comfortably on room air and not in distress, tolerating food well.  No need for inpatient admission      Final diagnoses:  Acute upper respiratory infection    ED Discharge Orders     None          Charmayne Holmes, DO 07/21/24 1039    Charmayne Holmes, DO 07/21/24 1041    Charmayne Holmes, DO 07/21/24 1055    Garrick Charleston, MD 07/21/24 [phone]
